# Patient Record
Sex: Female | Born: 1955 | Race: Black or African American | Hispanic: No | Marital: Single | Smoking: Former smoker
Health system: Southern US, Community
[De-identification: ages and names within clinical notes are randomized; demographics above are authoritative.]

## PROBLEM LIST (undated history)

## (undated) DIAGNOSIS — C7931 Secondary malignant neoplasm of brain: Principal | ICD-10-CM

## (undated) DIAGNOSIS — C3490 Malignant neoplasm of unspecified part of unspecified bronchus or lung: Secondary | ICD-10-CM

## (undated) HISTORY — DX: Secondary malignant neoplasm of brain: C79.31

---

## 2013-07-21 ENCOUNTER — Encounter (HOSPITAL_COMMUNITY): Payer: Self-pay | Admitting: Emergency Medicine

## 2013-07-21 ENCOUNTER — Emergency Department (INDEPENDENT_AMBULATORY_CARE_PROVIDER_SITE_OTHER)
Admission: EM | Admit: 2013-07-21 | Discharge: 2013-07-21 | Disposition: A | Discharge status: Home / Self Care | Payer: Self-pay | Source: Home / Self Care | Attending: Family Medicine | Admitting: Family Medicine

## 2013-07-21 DIAGNOSIS — L509 Urticaria, unspecified: Secondary | ICD-10-CM

## 2013-07-21 MED ORDER — FAMOTIDINE 40 MG PO TABS: 40.0000 mg | ORAL_TABLET | Freq: Every day | ORAL | Status: DC

## 2013-07-21 MED ORDER — PREDNISONE 5 MG PO TABS: 5.0000 mg | ORAL_TABLET | Freq: Every day | ORAL | Status: DC

## 2013-07-21 MED ORDER — CETIRIZINE HCL 10 MG PO TABS: 10.0000 mg | ORAL_TABLET | Freq: Every day | ORAL | Status: DC

## 2013-07-21 NOTE — ED Notes (Signed)
C/o rash on arms,legs, back, and neck.  Noticed irritation of skin yesterday.  States when getting out of shower this a.m rash spread all over.  Area are red and irritated.  No otc meds taken for symptoms.  Recent change in laundry detergent.

## 2013-07-22 NOTE — ED Provider Notes (Signed)
CSN: [id number]     Arrival date & time 07/21/13  1346 History   First MD Initiated Contact with Patient 07/21/13 1421     Chief Complaint  Patient presents with  . Rash   (Consider location/radiation/quality/duration/timing/severity/associated sxs/prior Treatment) HPI Comments: Denies fever, recent illness, recent travel or known ill contacts. No new medications or pets.   Patient is a 57 y.o. female presenting with rash. The history is provided by the patient.  Rash Pain location:  Generalized Pain severity:  No pain Onset quality:  Gradual Duration:  1 day Timing:  Constant Progression:  Worsening Chronicity:  New Context comment:  Reports recent change in laundry detergents. Relieved by:  Nothing Worsened by:  Nothing tried Ineffective treatments: Has taken 2 doses of oral benadryl over past 24 hours. Associated symptoms comment:  Describes rash as being very pruritic. Denies associated oral lesions or swelling of lips or tongue. No difficulty breathing, speaking or swallowing.    History reviewed. No pertinent past medical history. History reviewed. No pertinent past surgical history. History reviewed. No pertinent family history. History  Substance Use Topics  . Smoking status: Current Every Day Smoker -- 0.50 packs/day    Types: Cigarettes  . Smokeless tobacco: Not on file  . Alcohol Use: Yes   OB History   Grav Para Term Preterm Abortions TAB SAB Ect Mult Living                 Review of Systems  Skin: Positive for rash.  All other systems reviewed and are negative.    Allergies  Review of patient's allergies indicates no known allergies.  Home Medications   Current Outpatient Rx  Name  Route  Sig  Dispense  Refill  . cetirizine (ZYRTEC) 10 MG tablet   Oral   Take 1 tablet (10 mg total) by mouth daily.   30 tablet   1   . famotidine (PEPCID) 40 MG tablet   Oral   Take 1 tablet (40 mg total) by mouth daily.   30 tablet   1   . predniSONE  (DELTASONE) 5 MG tablet   Oral   Take 1 tablet (5 mg total) by mouth daily with breakfast. 6 tabs po QD beginning 07/21/2013, 5 tabs po QD day 2, 4 tabs po QD day 3, 3 tabs po QD day 4, 2 tab po QD day 5, 1 tab po QD day 6, then stop.   21 tablet   0    BP 111/64  Pulse 63  Temp(Src) 97.7 F (36.5 C) (Oral)  Resp 16  SpO2 100% Physical Exam  Nursing note and vitals reviewed. Constitutional: She is oriented to person, place, and time. She appears well-developed and well-nourished. No distress.  HENT:  Head: Normocephalic and atraumatic.  Mouth/Throat: Uvula is midline, oropharynx is clear and moist and mucous membranes are normal. No oral lesions.  Eyes: Conjunctivae are normal. Pupils are equal, round, and reactive to light. Right eye exhibits no discharge. Left eye exhibits no discharge. No scleral icterus.  Neck: Normal range of motion. Neck supple.  Cardiovascular: Normal rate, regular rhythm and normal heart sounds.   Pulmonary/Chest: Effort normal and breath sounds normal. No stridor. No respiratory distress. She has no wheezes.  Abdominal: Soft. Bowel sounds are normal. She exhibits no distension. There is no tenderness.  Musculoskeletal: Normal range of motion.  Lymphadenopathy:    She has no cervical adenopathy.  Neurological: She is alert and oriented to person, place, and time.  Skin: Skin is warm and dry. Rash noted.  +diffuse urticaria/hives  Psychiatric: She has a normal mood and affect. Her behavior is normal.    ED Course  Procedures (including critical care time) Labs Review Labs Reviewed - No data to display Imaging Review No results found.  EKG Interpretation    Date/Time:    Ventricular Rate:    PR Interval:    QRS Duration:   QT Interval:    QTC Calculation:   R Axis:     Text Interpretation:              MDM   1. Urticaria     Advised patient she was experiencing hives and that it may be difficult to determine the reason for the  allergic response. She was advised to take medications as prescribed today. Cautioned her that should she develop ANY difficulty breathing, speaking or swallowing or swelling of lips or tongue she should either dial 911 for assistance or report to her nearest ER for assistance. If condition continues to reoccur, advised that she may need allergy skin testing.    Jess Barters [place], [place] 07/22/13 [phone]

## 2013-07-23 NOTE — ED Provider Notes (Signed)
Medical screening examination/treatment/procedure(s) were performed by a resident physician or non-physician practitioner and as the supervising physician I was immediately available for consultation/collaboration.  Jaloni Davoli, MD   Liliauna Santoni S Raini Tiley, MD 07/23/13 1022 

## 2014-06-01 ENCOUNTER — Emergency Department (HOSPITAL_COMMUNITY)
Admission: EM | Admit: 2014-06-01 | Discharge: 2014-06-01 | Disposition: A | Discharge status: Home / Self Care | Payer: Self-pay | Attending: Emergency Medicine | Admitting: Emergency Medicine

## 2014-06-01 ENCOUNTER — Encounter (HOSPITAL_COMMUNITY): Payer: Self-pay | Admitting: Emergency Medicine

## 2014-06-01 ENCOUNTER — Emergency Department (HOSPITAL_COMMUNITY): Payer: Self-pay

## 2014-06-01 DIAGNOSIS — R0609 Other forms of dyspnea: Secondary | ICD-10-CM

## 2014-06-01 DIAGNOSIS — R634 Abnormal weight loss: Secondary | ICD-10-CM | POA: Insufficient documentation

## 2014-06-01 DIAGNOSIS — R0600 Dyspnea, unspecified: Secondary | ICD-10-CM | POA: Insufficient documentation

## 2014-06-01 DIAGNOSIS — R079 Chest pain, unspecified: Secondary | ICD-10-CM | POA: Insufficient documentation

## 2014-06-01 DIAGNOSIS — C3491 Malignant neoplasm of unspecified part of right bronchus or lung: Secondary | ICD-10-CM | POA: Insufficient documentation

## 2014-06-01 DIAGNOSIS — Z720 Tobacco use: Secondary | ICD-10-CM | POA: Insufficient documentation

## 2014-06-01 DIAGNOSIS — R61 Generalized hyperhidrosis: Secondary | ICD-10-CM | POA: Insufficient documentation

## 2014-06-01 LAB — BASIC METABOLIC PANEL
Anion gap: 13 (ref 5–15)
BUN: 5 mg/dL — AB (ref 6–23)
CO2: 25 meq/L (ref 19–32)
Calcium: 9.8 mg/dL (ref 8.4–10.5)
Chloride: 106 mEq/L (ref 96–112)
Creatinine, Ser: 0.55 mg/dL (ref 0.50–1.10)
GFR calc Af Amer: >90 mL/min (ref >90)
GFR calc non Af Amer: >90 mL/min (ref >90)
Glucose, Bld: 128 mg/dL — ABNORMAL HIGH (ref 70–99)
POTASSIUM: 3.9 meq/L (ref 3.7–5.3)
Sodium: 144 mEq/L (ref 137–147)

## 2014-06-01 LAB — CBC
HCT: 38.9 % (ref 36.0–46.0)
Hemoglobin: 13.2 g/dL (ref 12.0–15.0)
MCH: 30.7 pg (ref 26.0–34.0)
MCHC: 33.9 g/dL (ref 30.0–36.0)
MCV: 90.5 fL (ref 78.0–100.0)
Platelets: 249 10*3/uL (ref 150–400)
RBC: 4.3 MIL/uL (ref 3.87–5.11)
RDW: 13.9 % (ref 11.5–15.5)
WBC: 4.6 10*3/uL (ref 4.0–10.5)

## 2014-06-01 LAB — I-STAT TROPONIN, ED: Troponin i, poc: 0.01 ng/mL (ref 0.00–0.08)

## 2014-06-01 LAB — PRO B NATRIURETIC PEPTIDE: Pro B Natriuretic peptide (BNP): 68.9 pg/mL (ref 0–125)

## 2014-06-01 MED ORDER — IOHEXOL 300 MG/ML  SOLN
80.0000 mL | Freq: Once | INTRAMUSCULAR | Status: AC | PRN
  Administered 2014-06-01: 80 mL via INTRAVENOUS

## 2014-06-01 NOTE — ED Notes (Addendum)
Social work has talked to pt; taxi voucher given. [place] sandwich given.

## 2014-06-01 NOTE — ED Provider Notes (Signed)
CSN: [id number]     Arrival date & time 06/01/14  1450 History   First MD Initiated Contact with Patient 06/01/14 1900     Chief Complaint  Patient presents with  . Shortness of Breath  . Chest Pain     (Consider location/radiation/quality/duration/timing/severity/associated sxs/prior Treatment) Patient is a 58 y.o. female presenting with shortness of breath and chest pain. The history is provided by the patient and medical records. No language interpreter was used.  Shortness of Breath Associated symptoms: chest pain   Associated symptoms: no abdominal pain, no cough, no diaphoresis, no fever, no headaches, no rash, no vomiting and no wheezing   Chest Pain Associated symptoms: shortness of breath   Associated symptoms: no abdominal pain, no back pain, no cough, no diaphoresis, no fatigue, no fever, no headache, no nausea and not vomiting     Mariah Park is a 58 y.o. female  with no known medical history presents to the Emergency Department complaining of gradual, persistent, progressively worsening shortness of breath and chest pressure onset approximately 3 months ago. Patient reports significant shortness of breath with exertion gradually worsening over the last several months. She has associated 15, weight loss and night sweats.  She reports she smokes 5 cigarettes per day for more than 20 years. She reports that her sister died from an unknown type of cancer.  Patient reports she has not seen a doctor in many many years.  Nothing seems to make her symptoms better. She denies fevers, chills, headache, neck pain, abdominal pain, nausea, vomiting, diarrhea, weakness, dizziness, syncope, dysuria, hematuria.  She denies travel outside the [place].   History reviewed. No pertinent past medical history. History reviewed. No pertinent past surgical history. No family history on file. History  Substance Use Topics  . Smoking status: Current Every Day Smoker -- 0.50 packs/day   Types: Cigarettes  . Smokeless tobacco: Not on file  . Alcohol Use: Yes   OB History   Grav Para Term Preterm Abortions TAB SAB Ect Mult Living                 Review of Systems  Constitutional: Positive for unexpected weight change. Negative for fever, diaphoresis, appetite change and fatigue.       Night sweats  HENT: Negative for mouth sores.   Eyes: Negative for visual disturbance.  Respiratory: Positive for chest tightness and shortness of breath. Negative for cough and wheezing.   Cardiovascular: Positive for chest pain.  Gastrointestinal: Negative for nausea, vomiting, abdominal pain, diarrhea and constipation.  Endocrine: Negative for polydipsia, polyphagia and polyuria.  Genitourinary: Negative for dysuria, urgency, frequency and hematuria.  Musculoskeletal: Negative for back pain and neck stiffness.  Skin: Negative for rash.  Allergic/Immunologic: Negative for immunocompromised state.  Neurological: Negative for syncope, light-headedness and headaches.  Hematological: Does not bruise/bleed easily.  Psychiatric/Behavioral: Negative for sleep disturbance. The patient is not nervous/anxious.       Allergies  Review of patient's allergies indicates no known allergies.  Home Medications   Prior to Admission medications   Not on File   BP 134/89  Pulse 74  Temp(Src) 98.5 F (36.9 C) (Oral)  Resp 18  Ht 5\' 4"  (1.626 m)  Wt 130 lb (58.968 kg)  BMI 22.30 kg/m2  SpO2 96% Physical Exam  Nursing note and vitals reviewed. Constitutional: She appears well-developed and well-nourished. No distress.  Awake, alert, nontoxic appearance  HENT:  Head: Normocephalic and atraumatic.  Mouth/Throat: Oropharynx is clear and moist.  No oropharyngeal exudate.  Eyes: Conjunctivae are normal. No scleral icterus.  Neck: Normal range of motion. Neck supple.  Cardiovascular: Normal rate, regular rhythm, normal heart sounds and intact distal pulses.   Pulmonary/Chest: Effort normal.  No accessory muscle usage. Not tachypneic. No respiratory distress. She has decreased breath sounds in the right upper field, the right middle field and the right lower field. She has no wheezes.  Equal chest expansion Mildly diminished breath sounds on right  Abdominal: Soft. Bowel sounds are normal. She exhibits no distension and no mass. There is no tenderness. There is no rebound and no guarding.  Musculoskeletal: Normal range of motion. She exhibits no edema.  Neurological: She is alert. She exhibits normal muscle tone. Coordination normal.  Speech is clear and goal oriented Moves extremities without ataxia  Skin: Skin is warm and dry. She is not diaphoretic. No erythema.  Psychiatric: She has a normal mood and affect.    ED Course  Procedures (including critical care time) Labs Review Labs Reviewed  BASIC METABOLIC PANEL - Abnormal; Notable for the following:    Glucose, Bld 128 (*)    BUN 5 (*)    All other components within normal limits  CBC  PRO B NATRIURETIC PEPTIDE  I-STAT TROPOININ, ED    Imaging Review Dg Chest 2 View  06/01/2014   CLINICAL DATA:  Shortness of breath and chest pain  EXAM: CHEST  2 VIEW  COMPARISON:  None.  FINDINGS: Cardiac shadow is within normal limits. There is fullness in the right peritracheal region and right hilar region identified. Right basilar atelectasis is noted. The left lung is well aerated. No acute bony abnormality is seen.  IMPRESSION: Changes consistent with increased lymphadenopathy/possible mass in the right peritracheal region and right hilar region. Further evaluation by means of CT of the chest with contrast is recommended.  These results were called by telephone at the time of interpretation on 06/01/2014 at 3:50 pm to Dr. Rogene Houston, who verbally acknowledged these results.   Electronically Signed   By: Inez Catalina M.D.   On: 06/01/2014 15:53   Ct Chest W Contrast  06/01/2014   CLINICAL DATA:  Cough and shortness of breath. Lump  on the left near the last rib.  EXAM: CT CHEST WITH CONTRAST  TECHNIQUE: Multidetector CT imaging of the chest was performed during intravenous contrast administration.  CONTRAST:  80 mL OMNIPAQUE IOHEXOL 300 MG/ML  SOLN  COMPARISON:  PA and lateral chest earlier this same day.  FINDINGS: A necrotic lymph node in the right pretracheal station on image 16 measures 3.1 x 3.1 cm. There is a large right hilar mass encasing the right mainstem bronchus and right main pulmonary artery. The mass measures 7.2 x 7.5 cm on image 28. The lesion narrows the right mainstem bronchus and appears to occlude it distally. The lesion also narrows the right main pulmonary artery. No embolus is seen.  There is no pleural or pericardial effusion.  Heart size is normal.  The lungs demonstrate emphysematous disease. There is volume loss in the right chest secondary to atelectasis related to right mainstem bronchus occlusion. Atelectatic change is seen in the right base. There is a right lower lobe pulmonary nodule on image 39 measures 0.7 cm in diameter. A subpleural nodule in the periphery of the right middle lobe measures 0.6 cm in diameter. A nodule along the left major fissure on image 32 measures 0.4 cm. A nodule in the lingula also on image 32 measures 0.4  cm.  Visualized abdomen shows a possible punctate nonobstructing stone in the upper pole the right kidney. The adrenal glands, spleen, pancreas, gallbladder and biliary tree appear normal. No liver lesion is identified. No focal bony abnormality is seen.  IMPRESSION: Large mass centered in the right hilum consistent with bronchogenic carcinoma. As above, the lesion occludes the distal right mainstem bronchus resulting in volume loss in the right chest and markedly narrows the right main pulmonary artery. Associated metastatic pretracheal lymphadenopathy and nodules in both lungs are as described above.   Electronically Signed   By: Inge Rise M.D.   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  Labs reassuring and ECG nonischemic. Patient chest x-ray with concerning lymphadenopathy versus right peritracheal and hilar mass.  Will obtain chest CT.  9:35pm CT chest with A necrotic lymph node in the right pretracheal station, large right hilar mass encasing the right mainstem bronchus and right main pulmonary artery narrowing the right mainstem bronchus and appears to occlude it distally. The lesion also narrows the right main pulmonary artery. No PE.  CT chest consistent with bronchogenic carcinoma.  Patient alert and oriented.  She is without dyspnea at rest and has no hypoxia. No tachycardia and no PE on CT scan.  Patient is without primary care physician or health insurance. She's been seen by the social work and case management who are actively working to get her an appointment with the wellness Center and referred to oncology. Patient is tanned the importance of close followup and the need for such treatment.  I have personally reviewed patient's vitals, nursing note and any pertinent labs or imaging.  I performed an undressed physical exam.    It has been determined that no acute conditions requiring further emergency intervention are present at this time. The patient/guardian have been advised of the diagnosis and plan. I reviewed all labs and imaging including any potential incidental findings. We have discussed signs and symptoms that warrant return to the ED, such as increasing shortness  of breath or other concerning symptoms.  Patient/guardian has voiced understanding and agreed to follow-up with the PCP or specialist in 7 days.  Vital signs are stable at discharge.   BP 134/89  Pulse 74  Temp(Src) 98.5 F (36.9 C) (Oral)  Resp 18  Ht 5\' 4"  (1.626 m)  Wt 130 lb (58.968 kg)  BMI 22.30 kg/m2  SpO2 96%          Abigail Butts, PA-C 06/02/14 0802

## 2014-06-01 NOTE — ED Notes (Signed)
Pt reports central chest "pressure, tightness" x months with SOB with exertion. Pt also reports weakness and mild nausea, denies diaphoresis. Pt in NAD. Ambulatory to triage. Reports NP cough as well for last several months. States "I take cough drops like candy."

## 2014-06-01 NOTE — Discharge Instructions (Signed)
1. Medications: usual home medications 2. Treatment: rest, drink plenty of fluids,  3. Follow Up: [facility]   Lung Cancer Lung cancer is an abnormal growth of cells in one or both of your lungs. These extra cells may form a mass of tissue called a growth or tumor. Tumors can be either cancerous (malignant) or not cancerous (benign).  Lung cancer is the most common cause of cancer death in men and women. There are several different types of lung cancers. Usually, lung cancer is described as either small cell lung cancer or nonsmall cell lung cancer. Other types of cancer occur in the lungs, including carcinoid and cancers spread from other organs. The types of cancer have different behavior and treatment. RISK FACTORS Smoking is the most common risk factor for developing lung cancer. Other risk factors include:  Radon gas exposure.  Asbestos and other industrial substance exposure.  Second hand tobacco smoke.  Air pollution.  Family or personal history of lung cancer.  Age older than 59 years. CAUSES  Lung cancer usually starts when the lungs are exposed to harmful chemicals. Smoking is the most common risk factor for lung cancer. When you quit smoking, your risk of lung cancer falls each year (but is never the same as a person who has never smoked).  SYMPTOMS  Lung cancer may not have any symptoms in its early stages. The symptoms can depend on the type of cancer, its location, and other factors. Symptoms can include:  Cough (either new, different, or more severe).  Shortness of breath.  Coughing up blood (hemoptysis).  Chest pain.  Hoarseness.  Swelling of the face.  Drooping eyelid.  Changes in blood tests, such as low sodium (hyponatremia), high calcium (hypercalcemia), or low blood count (anemia).  Weight loss. DIAGNOSIS  Your health care provider may suspect lung cancer based on your symptoms or based  on tests obtained for other reasons. Tests or procedures used to find or confirm the presence of lung cancer may include:  Chest X-ray.  CT scan of the lungs and chest.  Blood tests.  Taking a tissue sample (biopsy) from your lung to look for cancer cells. Your cancer will be staged to determine its severity and extent. Staging is a careful attempt to find out the size of the tumor, whether the cancer has spread, and if so, to what parts of the body. You may need to have more tests to determine the stage of your cancer. The test results will help determine what treatment plan is best for you.   Stage 0--This is the earliest stage of lung cancer. In this stage the tumor is present in only a few layers of cells and has not grown beyond the inner lining of the lungs. Stage 0 (carcinoma in situ) is considered noninvasive, meaning at this stage it is not yet capable of spreading to other regions.  Stage I-- The cancer is located only in the lungs and not spread to any lymph nodes.  Stage II--The cancer is in the lungs and the nearby lymph nodes.  Stage III--The cancer is in the lungs and the lymph nodes in the middle of the chest. This is also called locally advanced disease. This stage has two subtypes:  Stage IIIa - The cancer has spread only to lymph nodes on the same side of the chest where the cancer started.  Stage IIIb - The cancer has spread to lymph nodes on the  opposite side of the chest or above the collar bone.  Stage IV-- This is the most advanced stage of lung cancer and is also called advanced disease. This stage describes when the cancer has spread to both lungs, the fluid in the area around the lungs, or to another body part. Your health care provider may tell you the detailed stage of your cancer, which includes both a number and a letter.  TREATMENT  Depending on the type and stage, lung cancer may be treated with surgery, radiation therapy, chemotherapy, or targeted therapy.  Some people have a combination of these therapies. Your treatment plan will be developed by your health care team.  [place] not smoke.  Only take over-the-counter or prescription medicines for pain, discomfort, or fever as directed by your health care provider.  Maintain a healthy diet.  Consider joining a support group. This may help you learn to cope with the stress of having lung cancer.  Seek advice to help you manage treatment side effects.  Keep all follow-up appointments as directed by your health care provider.  Inform your cancer specialist if you are admitted to the hospital. [place] IF:   You are losing weight without trying.  You have a persistent cough.  You feel short of breath.  You tire easily. SEEK IMMEDIATE MEDICAL CARE IF:   You cough up clotted blood or bright red blood.  Your pain is not manageable or controlled by medicine.  You develop new difficulty breathing or chest pain.  You develop swelling in one or both ankles or legs, or swelling in your face or neck.  You develop headache or confusion. Document Released: 11/05/2000 Document Revised: 05/20/2013 Document Reviewed: 12/03/2013 [facility] Patient Information 2015 [place], [place]. This information is not intended to replace advice given to you by your health care provider. Make sure you discuss any questions you have with your health care provider.

## 2014-06-02 MED ORDER — OXYCODONE-ACETAMINOPHEN 5-325 MG PO TABS: 1.0000 | ORAL_TABLET | ORAL | Status: DC | PRN

## 2014-06-02 NOTE — ED Provider Notes (Signed)
5:09 PM Patient called in to Case manager asking for pain medication for her chest. She is currently awaiting appointment with  [facility] and will require referral to oncology. Will prescribe percocet.    Margarita Mail, PA-C 06/02/14 1714

## 2014-06-02 NOTE — ED Provider Notes (Signed)
Medical screening examination/treatment/procedure(s) were performed by non-physician practitioner and as supervising physician I was immediately available for consultation/collaboration.   EKG Interpretation None        Mariea Clonts, MD 06/02/14 2352

## 2014-06-02 NOTE — Care Management (Addendum)
ED CM and ED CSW consulted by. H Muthersbaugh PA-C concerning possible cancer finding noted on Chest CT. Patient presented today with complaints of Chest tightness and non productive cough that she has had for several months. Met with patient offered contact someone to come to be with her but she declined, Discussed [place] and follow up care patient agreeable to plan. Explained that this CM will contact the clinic in the morning and I will contact her once I have spoken with the scheduler. Patient agreeable, verified contact information. Patient is requesting a ride home, ED CSW willl arrange for transportion. ED CM will follow up with patient.

## 2014-06-03 NOTE — Care Management (Addendum)
ED CM  placed [place] Referral Rhys Martini [phone], also contacted the Dr.Jegede he has agreed to  Follow patient at [facility].  Patient was updated, and is agreeable, she.verbalized understanding teach back done. Explained that she will receive a call to schedule an appointment, provided patient with contact number if she should have any further questions of concerns. Patient verbalized understanding and appreciation for the assistance. CM will follow up.

## 2014-06-04 ENCOUNTER — Telehealth: Payer: Self-pay | Admitting: Internal Medicine

## 2014-06-04 NOTE — Telephone Encounter (Signed)
LEFT MESSAGE FOR PATIENT TO RETURN CALL TO [facility] NP APPT.

## 2014-06-04 NOTE — ED Provider Notes (Signed)
Medical screening examination/treatment/procedure(s) were performed by non-physician practitioner and as supervising physician I was immediately available for consultation/collaboration.   EKG Interpretation   Date/Time:  Tuesday June 01 2014 15:09:55 EDT Ventricular Rate:  83 PR Interval:  162 QRS Duration: 110 QT Interval:  370 QTC Calculation: 434 R Axis:   73 Text Interpretation:  Sinus rhythm with Fusion complexes Septal infarct ,  age undetermined Abnormal ECG ED PHYSICIAN INTERPRETATION AVAILABLE IN  [facility] [place] Confirmed by TEST, Record ([id number]) on 06/03/2014 7:34:46 AM       Leota Jacobsen, MD 06/04/14 [phone]

## 2014-06-08 ENCOUNTER — Telehealth: Payer: Self-pay | Admitting: Internal Medicine

## 2014-06-08 NOTE — Telephone Encounter (Signed)
S/W PATIENT AND GAVE NP APPT FOR 10/28 @ 10:30 W/DR. MOHAMED.  REFERRING- ED  DX- CHEST MASS  C/D ON 10/27 FOR 10/28 NP APPT.

## 2014-06-09 ENCOUNTER — Other Ambulatory Visit: Payer: Self-pay | Admitting: Internal Medicine

## 2014-06-09 ENCOUNTER — Ambulatory Visit (HOSPITAL_BASED_OUTPATIENT_CLINIC_OR_DEPARTMENT_OTHER): Payer: Self-pay | Admitting: Internal Medicine

## 2014-06-09 ENCOUNTER — Encounter: Payer: Self-pay | Admitting: Internal Medicine

## 2014-06-09 ENCOUNTER — Ambulatory Visit: Payer: Self-pay

## 2014-06-09 ENCOUNTER — Other Ambulatory Visit: Payer: Self-pay | Admitting: Medical Oncology

## 2014-06-09 ENCOUNTER — Other Ambulatory Visit (HOSPITAL_BASED_OUTPATIENT_CLINIC_OR_DEPARTMENT_OTHER): Payer: Self-pay

## 2014-06-09 ENCOUNTER — Telehealth: Payer: Self-pay | Admitting: Internal Medicine

## 2014-06-09 VITALS — BP 132/92 | HR 79 | Temp 99.3°F | Resp 19 | Ht 64.0 in | Wt 138.8 lb

## 2014-06-09 DIAGNOSIS — R918 Other nonspecific abnormal finding of lung field: Secondary | ICD-10-CM

## 2014-06-09 DIAGNOSIS — R591 Generalized enlarged lymph nodes: Secondary | ICD-10-CM

## 2014-06-09 DIAGNOSIS — R229 Localized swelling, mass and lump, unspecified: Secondary | ICD-10-CM

## 2014-06-09 DIAGNOSIS — R0602 Shortness of breath: Secondary | ICD-10-CM

## 2014-06-09 DIAGNOSIS — R634 Abnormal weight loss: Secondary | ICD-10-CM

## 2014-06-09 LAB — CBC WITH DIFFERENTIAL/PLATELET
BASO%: 0.1 % (ref 0.0–2.0)
BASOS ABS: 0 10*3/uL (ref 0.0–0.1)
EOS%: 0.7 % (ref 0.0–7.0)
Eosinophils Absolute: 0.1 10*3/uL (ref 0.0–0.5)
HCT: 34.8 % (ref 34.8–46.6)
HEMOGLOBIN: 11.8 g/dL (ref 11.6–15.9)
LYMPH#: 1.3 10*3/uL (ref 0.9–3.3)
LYMPH%: 14.5 % (ref 14.0–49.7)
MCH: 30.6 pg (ref 25.1–34.0)
MCHC: 33.9 g/dL (ref 31.5–36.0)
MCV: 90.4 fL (ref 79.5–101.0)
MONO#: 0.9 10*3/uL (ref 0.1–0.9)
MONO%: 10.6 % (ref 0.0–14.0)
NEUT#: 6.5 10*3/uL (ref 1.5–6.5)
NEUT%: 74.1 % (ref 38.4–76.8)
Platelets: 257 10*3/uL (ref 145–400)
RBC: 3.85 10*6/uL (ref 3.70–5.45)
RDW: 14.2 % (ref 11.2–14.5)
WBC: 8.8 10*3/uL (ref 3.9–10.3)

## 2014-06-09 LAB — COMPREHENSIVE METABOLIC PANEL (CC13)
ALBUMIN: 2.8 g/dL — AB (ref 3.5–5.0)
ALT: 106 U/L — ABNORMAL HIGH (ref 0–55)
AST: 49 U/L — AB (ref 5–34)
Alkaline Phosphatase: 104 U/L (ref 40–150)
Anion Gap: 8 mEq/L (ref 3–11)
BUN: 6.7 mg/dL — AB (ref 7.0–26.0)
CALCIUM: 10 mg/dL (ref 8.4–10.4)
CHLORIDE: 106 meq/L (ref 98–109)
CO2: 27 mEq/L (ref 22–29)
Creatinine: 0.7 mg/dL (ref 0.6–1.1)
Glucose: 110 mg/dl (ref 70–140)
POTASSIUM: 3.8 meq/L (ref 3.5–5.1)
Sodium: 140 mEq/L (ref 136–145)
TOTAL PROTEIN: 7.3 g/dL (ref 6.4–8.3)
Total Bilirubin: 0.47 mg/dL (ref 0.20–1.20)

## 2014-06-09 NOTE — Patient Instructions (Signed)
You Can Quit Smoking If you are ready to quit smoking or are thinking about it, congratulations! You have chosen to help yourself be healthier and live longer! There are lots of different ways to quit smoking. Nicotine gum, nicotine patches, a nicotine inhaler, or nicotine nasal spray can help with physical craving. Hypnosis, support groups, and medicines help break the habit of smoking. TIPS TO GET OFF AND STAY OFF CIGARETTES  Learn to predict your moods. Do not let a bad situation be your excuse to have a cigarette. Some situations in your life might tempt you to have a cigarette.  Ask friends and co-workers not to smoke around you.  Make your home smoke-free.  Never have "just one" cigarette. It leads to wanting another and another. Remind yourself of your decision to quit.  On a card, make a list of your reasons for not smoking. Read it at least the same number of times a day as you have a cigarette. Tell yourself everyday, "I do not want to smoke. I choose not to smoke."  Ask someone at home or work to help you with your plan to quit smoking.  Have something planned after you eat or have a cup of coffee. Take a walk or get other exercise to perk you up. This will help to keep you from overeating.  Try a relaxation exercise to calm you down and decrease your stress. Remember, you may be tense and nervous the first two weeks after you quit. This will pass.  Find new activities to keep your hands busy. Play with a pen, coin, or rubber band. Doodle or draw things on paper.  Brush your teeth right after eating. This will help cut down the craving for the taste of tobacco after meals. You can try mouthwash too.  Try gum, breath mints, or diet candy to keep something in your mouth. IF YOU SMOKE AND WANT TO QUIT:  Do not stock up on cigarettes. Never buy a carton. Wait until one pack is finished before you buy another.  Never carry cigarettes with you at work or at home.  Keep cigarettes  as far away from you as possible. Leave them with someone else.  Never carry matches or a lighter with you.  Ask yourself, "Do I need this cigarette or is this just a reflex?"  Bet with someone that you can quit. Put cigarette money in a piggy bank every morning. If you smoke, you give up the money. If you do not smoke, by the end of the week, you keep the money.  Keep trying. It takes 21 days to change a habit!  Talk to your doctor about using medicines to help you quit. These include nicotine replacement gum, lozenges, or skin patches. Document Released: 05/26/2009 Document Revised: 10/22/2011 Document Reviewed: 05/26/2009 ExitCare Patient Information 2015 [employer]. This information is not intended to replace advice given to you by your health care provider. Make sure you discuss any questions you have with your health care provider.  

## 2014-06-09 NOTE — Progress Notes (Signed)
[place] [phone]   [phone]  CONSULT NOTE  REFERRING PHYSICIAN: Dr. Lacretia Leigh.  REASON FOR CONSULTATION:  58 years old [place] American female with questionable lung cancer.  HPI Mariah Park is a 58 y.o. female with no significant past medical history but long history of smoking. The patient has been complaining of progressive shortness of breath as well as chest pain and cough productive of yellowish sputum started a few months ago. She also felt a nodule in the left side of the abdomen will be week ago. The patient also has a weight loss of around 20 pounds over the last few weeks. She presented to the emergency Department at [facility] for evaluation. Chest x-ray followed by CT scan of the chest was performed on 06/01/2014 and it showed necrotic lymph node in the right pretracheal station measures 3.1 x 3.1 cm. There is a large right hilar mass encasing the right mainstem bronchus and right main pulmonary artery. The mass measures 7.2 x 7.5 cm. The lesion narrows the right mainstem bronchus and appears to occlude it distally. The lesion also narrows the right main pulmonary artery. There is a right lower lobe pulmonary nodule measures 0.7 cm in diameter. A subpleural nodule in the periphery of the right middle lobe measures 0.6 cm in diameter. A nodule along the left major fissure measures 0.4 cm. A nodule in the lingula also measures 0.4 cm.  The patient was referred to me today for evaluation and recommendation regarding treatment of her condition. She denied having any significant weight loss or night sweats, no nausea or vomiting. She continues to have occasional central chest pain as well as shortness of breath with cough productive of clear sputum but no hemoptysis.  HPI  PAST MEDICAL HISTORY: The patient denied having any significant medical issues. She has no hypertension, diabetes mellitus, dyslipidemia, heart disease or  stroke  FAMILY HISTORY: mother had brain tumor and father died from old age  SOCIAL HISTORY: The patient is single and has one son. She is currently unemployed and used to work at [employer]. She has a history of smoking one pack per day for around 35 years and quit one week ago. The patient denied having any history of alcohol abuse but she smokes Marjuina.  Social History History  Substance Use Topics  . Smoking status: Former Smoker -- 0.50 packs/day    Types: Cigarettes    Quit date: 06/02/2014  . Smokeless tobacco: Not on file  . Alcohol Use: Yes    No Known Allergies  Current Outpatient Prescriptions  Medication Sig Dispense Refill  . oxyCODONE-acetaminophen (PERCOCET) 5-325 MG per tablet Take 1-2 tablets by mouth every 4 (four) hours as needed.  20 tablet  0   No current facility-administered medications for this visit.    Review of Systems  Constitutional: positive for fatigue and weight loss Eyes: negative Ears, nose, mouth, throat, and face: negative Respiratory: positive for cough, dyspnea on exertion, pleurisy/chest pain and sputum Cardiovascular: negative Gastrointestinal: negative Genitourinary:negative Integument/breast: negative Hematologic/lymphatic: negative Musculoskeletal:negative Neurological: negative Behavioral/Psych: negative Endocrine: negative Allergic/Immunologic: negative  Physical Exam  XBM:WUXLK, healthy, no distress, well nourished, well developed and anxious SKIN: skin color, texture, turgor are normal, no rashes or significant lesions HEAD: Normocephalic, No masses, lesions, tenderness or abnormalities EYES: normal, PERRLA EARS: External ears normal, Canals clear OROPHARYNX:no exudate, no erythema and lips, buccal mucosa, and tongue normal  NECK: supple, no adenopathy, no JVD LYMPH:  no  palpable lymphadenopathy, no hepatosplenomegaly BREAST:not examined LUNGS: expiratory wheezes bilaterally HEART: regular rate & rhythm, no murmurs  and no gallops ABDOMEN:abdomen soft, non-tender, normal bowel sounds and no masses or organomegaly BACK: Back symmetric, no curvature., No CVA tenderness EXTREMITIES:no joint deformities, effusion, or inflammation, no edema, no skin discoloration  NEURO: alert & oriented x 3 with fluent speech, no focal motor/sensory deficits  PERFORMANCE STATUS: ECOG 1  LABORATORY DATA: Lab Results  Component Value Date   WBC 8.8 06/09/2014   HGB 11.8 06/09/2014   HCT 34.8 06/09/2014   MCV 90.4 06/09/2014   PLT 257 06/09/2014      Chemistry      Component Value Date/Time   NA 140 06/09/2014 1133   NA 144 06/01/2014 1614   K 3.8 06/09/2014 1133   K 3.9 06/01/2014 1614   CL 106 06/01/2014 1614   CO2 27 06/09/2014 1133   CO2 25 06/01/2014 1614   BUN 6.7* 06/09/2014 1133   BUN 5* 06/01/2014 1614   CREATININE 0.7 06/09/2014 1133   CREATININE 0.55 06/01/2014 1614      Component Value Date/Time   CALCIUM 10.0 06/09/2014 1133   CALCIUM 9.8 06/01/2014 1614   ALKPHOS 104 06/09/2014 1133   AST 49* 06/09/2014 1133   ALT 106* 06/09/2014 1133   BILITOT 0.47 06/09/2014 1133       RADIOGRAPHIC STUDIES: Dg Chest 2 View  06/01/2014   CLINICAL DATA:  Shortness of breath and chest pain  EXAM: CHEST  2 VIEW  COMPARISON:  None.  FINDINGS: Cardiac shadow is within normal limits. There is fullness in the right peritracheal region and right hilar region identified. Right basilar atelectasis is noted. The left lung is well aerated. No acute bony abnormality is seen.  IMPRESSION: Changes consistent with increased lymphadenopathy/possible mass in the right peritracheal region and right hilar region. Further evaluation by means of CT of the chest with contrast is recommended.  These results were called by telephone at the time of interpretation on 06/01/2014 at 3:50 pm to Dr. Rogene Houston, who verbally acknowledged these results.   Electronically Signed   By: Inez Catalina M.D.   On: 06/01/2014 15:53   Ct Chest W  Contrast  06/01/2014   CLINICAL DATA:  Cough and shortness of breath. Lump on the left near the last rib.  EXAM: CT CHEST WITH CONTRAST  TECHNIQUE: Multidetector CT imaging of the chest was performed during intravenous contrast administration.  CONTRAST:  80 mL OMNIPAQUE IOHEXOL 300 MG/ML  SOLN  COMPARISON:  PA and lateral chest earlier this same day.  FINDINGS: A necrotic lymph node in the right pretracheal station on image 16 measures 3.1 x 3.1 cm. There is a large right hilar mass encasing the right mainstem bronchus and right main pulmonary artery. The mass measures 7.2 x 7.5 cm on image 28. The lesion narrows the right mainstem bronchus and appears to occlude it distally. The lesion also narrows the right main pulmonary artery. No embolus is seen.  There is no pleural or pericardial effusion.  Heart size is normal.  The lungs demonstrate emphysematous disease. There is volume loss in the right chest secondary to atelectasis related to right mainstem bronchus occlusion. Atelectatic change is seen in the right base. There is a right lower lobe pulmonary nodule on image 39 measures 0.7 cm in diameter. A subpleural nodule in the periphery of the right middle lobe measures 0.6 cm in diameter. A nodule along the left major fissure on image 32 measures  0.4 cm. A nodule in the lingula also on image 32 measures 0.4 cm.  Visualized abdomen shows a possible punctate nonobstructing stone in the upper pole the right kidney. The adrenal glands, spleen, pancreas, gallbladder and biliary tree appear normal. No liver lesion is identified. No focal bony abnormality is seen.  IMPRESSION: Large mass centered in the right hilum consistent with bronchogenic carcinoma. As above, the lesion occludes the distal right mainstem bronchus resulting in volume loss in the right chest and markedly narrows the right main pulmonary artery. Associated metastatic pretracheal lymphadenopathy and nodules in both lungs are as described above.    Electronically Signed   By: Mariah Park M.D.   On: 06/01/2014 20:53    ASSESSMENT: This is a very pleasant 58 years old [place] American female presented for evaluation of questionable metastatic lung cancer presented with large mass centered in the right hilum as well as mediastinal lymphadenopathy and bilateral pulmonary nodules and subcutaneous nodule highly suspicious for small cell lung cancer but non-small cell lung cancer cannot be excluded at this point. The patient has no tissue diagnosis.   PLAN: I had a lengthy discussion with the patient today about her condition and I showed her the images of the CT scan of the chest. I recommended for the patient to complete the staging workup by ordering a PET scan as well as MRI of the brain to rule out any other metastatic disease. I will also arrange for the patient appointment with thoracic surgery next week at the multidisciplinary thoracic oncology clinic for evaluation and constipation of bronchoscopy or biopsy of the subcutaneous nodules for tissue diagnosis.  I would see the patient back for followup visit in 2 weeks for evaluation and discussion of her treatment options based on the final staging workup and biopsy results. She was advised to call immediately if she has any concerning symptoms in the interval.  The patient voices understanding of current disease status and treatment options and is in agreement with the current care plan.  All questions were answered. The patient knows to call the clinic with any problems, questions or concerns. We can certainly see the patient much sooner if necessary.  Thank you so much for allowing me to participate in the care of [place]. I will continue to follow up the patient with you and assist in her care.  I spent 40 minutes counseling the patient face to face. The total time spent in the appointment was 60 minutes.  Disclaimer: This note was dictated with voice recognition software.  Similar sounding words can inadvertently be transcribed and may not be corrected upon review.   Nicholette Dolson K. 06/09/2014, 12:29 PM

## 2014-06-09 NOTE — Telephone Encounter (Signed)
Pt confirmed MD visit per 10/28 POF, gave pt AVS.... KJ

## 2014-06-09 NOTE — Progress Notes (Signed)
Checked in new patient.she has applied for insurance-- [employer]?? She has not traveled and has her appt card. She knows she is self pay until she gets insurance.

## 2014-06-10 ENCOUNTER — Telehealth: Payer: Self-pay | Admitting: *Deleted

## 2014-06-10 NOTE — Telephone Encounter (Signed)
Called [facility] per Dr. Everrett Coombe and Dr. Worthy Flank request to set up an appt.  Received appt.  Called patient with appt.  She is unable to be seen today.  She stated there is no way possible she could make it today.  I called [facility] to update.

## 2014-06-14 ENCOUNTER — Telehealth: Payer: Self-pay | Admitting: Surgery

## 2014-06-14 NOTE — Telephone Encounter (Signed)
ED CM received call from patient today regarding information about cancer resources. Referred patient to cancer center navigator at [phone]. No further ED CM needs identified

## 2014-06-15 ENCOUNTER — Telehealth: Payer: Self-pay | Admitting: *Deleted

## 2014-06-15 ENCOUNTER — Encounter: Payer: Self-pay | Admitting: *Deleted

## 2014-06-15 NOTE — Telephone Encounter (Signed)
I discussed patient's transportation issue with Dr. Julien Nordmann.  He was ok with patient coming next week after scans to see him and surgery at clinic.  I called patient back to update.  She was thankful for the consolidation of appt's

## 2014-06-15 NOTE — Progress Notes (Signed)
Clinical Social Work received phone call from patient stating she "needs somewhere to live for a few weeks".  CSW suggested a community shelter- patient shared she cannot stay at a shelter.  Patient is currently residing at someone's home but feels it is not in her best interest to stay there. CSW provided patient with information on [employer] and contacted Norton Blizzard, thoracic navigator to follow up with patient regarding scheduled appointments.  Polo Riley, MSW, LCSW, OSW-C Clinical Social Worker [facility] [phone]

## 2014-06-15 NOTE — Telephone Encounter (Signed)
I spoke with social worker today.  Patient is having transportation and housing issues.  It would be better for patient is she came next week 06/24/14 at 3:00.  She is aware of her PET/MRI Brain scan on 06/18/14.

## 2014-06-18 ENCOUNTER — Ambulatory Visit (HOSPITAL_COMMUNITY)
Admission: RE | Admit: 2014-06-18 | Discharge: 2014-06-18 | Disposition: A | Discharge status: Home / Self Care | Payer: Self-pay | Source: Ambulatory Visit | Attending: Diagnostic Radiology | Admitting: Diagnostic Radiology

## 2014-06-18 ENCOUNTER — Encounter (HOSPITAL_COMMUNITY)
Admission: RE | Admit: 2014-06-18 | Discharge: 2014-06-18 | Disposition: A | Discharge status: Home / Self Care | Payer: Self-pay | Source: Ambulatory Visit | Attending: Internal Medicine | Admitting: Internal Medicine

## 2014-06-18 ENCOUNTER — Encounter (HOSPITAL_COMMUNITY): Payer: Self-pay

## 2014-06-18 DIAGNOSIS — C7931 Secondary malignant neoplasm of brain: Secondary | ICD-10-CM | POA: Insufficient documentation

## 2014-06-18 DIAGNOSIS — R918 Other nonspecific abnormal finding of lung field: Secondary | ICD-10-CM

## 2014-06-18 DIAGNOSIS — K118 Other diseases of salivary glands: Secondary | ICD-10-CM | POA: Insufficient documentation

## 2014-06-18 DIAGNOSIS — C3490 Malignant neoplasm of unspecified part of unspecified bronchus or lung: Secondary | ICD-10-CM | POA: Insufficient documentation

## 2014-06-18 LAB — GLUCOSE, CAPILLARY: Glucose-Capillary: 96 mg/dL (ref 70–99)

## 2014-06-18 MED ORDER — FLUDEOXYGLUCOSE F - 18 (FDG) INJECTION: 6.8000 | Freq: Once | INTRAVENOUS | Status: AC | PRN

## 2014-06-18 MED ORDER — GADOBENATE DIMEGLUMINE 529 MG/ML IV SOLN
12.0000 mL | Freq: Once | INTRAVENOUS | Status: AC | PRN
  Administered 2014-06-18: 12 mL via INTRAVENOUS

## 2014-06-19 ENCOUNTER — Other Ambulatory Visit: Payer: Self-pay | Admitting: Internal Medicine

## 2014-06-19 DIAGNOSIS — R918 Other nonspecific abnormal finding of lung field: Secondary | ICD-10-CM

## 2014-06-21 ENCOUNTER — Encounter: Payer: Self-pay | Admitting: *Deleted

## 2014-06-21 ENCOUNTER — Other Ambulatory Visit: Payer: Self-pay | Admitting: Internal Medicine

## 2014-06-21 ENCOUNTER — Ambulatory Visit: Payer: Self-pay

## 2014-06-21 ENCOUNTER — Encounter: Payer: Self-pay | Admitting: Internal Medicine

## 2014-06-21 ENCOUNTER — Telehealth: Payer: Self-pay | Admitting: *Deleted

## 2014-06-21 DIAGNOSIS — C7931 Secondary malignant neoplasm of brain: Secondary | ICD-10-CM

## 2014-06-21 HISTORY — DX: Secondary malignant neoplasm of brain: C79.31

## 2014-06-21 MED ORDER — DEXAMETHASONE 4 MG PO TABS: 4.0000 mg | ORAL_TABLET | Freq: Two times a day (BID) | ORAL | Status: DC

## 2014-06-21 NOTE — Telephone Encounter (Signed)
Spoke with Dr. Julien Nordmann about patient appt with Rad Onc on 06/30/14.  Dr. Julien Nordmann would like patient to start on medication  due to brain mets.  I called patient and left vm message to call me with name and phone number

## 2014-06-21 NOTE — Telephone Encounter (Signed)
Called patient to update regarding medication.  I asked if she was having any neuro symptoms like headaches or nausea/vomiting or blurred/double vision.  She stated no.  Per Dr. Julien Nordmann, I stated that MRI Brain showed something and she needed to start mediation as soon as possible.  I notified her of prescription that is here waiting for her.  She stated she could not get here today but she would come first thing tomorrow to pick up prescription.  I notified her that if she has any sever headaches, nausea/vomiting or vision changes to go to ED.  She verbalized understanding.

## 2014-06-22 ENCOUNTER — Ambulatory Visit: Payer: Self-pay | Admitting: Internal Medicine

## 2014-06-22 ENCOUNTER — Telehealth: Payer: Self-pay | Admitting: *Deleted

## 2014-06-22 NOTE — Telephone Encounter (Signed)
Called patient today regarding prescription.  I noted patient still has not picked up prescription for decadron.  I spoke with patient and asked how she was feeling, she stated not to good.  I asked if she was able to get transportation to the cancer center to pick up prescription.  She stated yes, she will be here today.  I instructed her on what to do when she gets to cancer center to pick up medication.  She verbalized understanding.

## 2014-06-23 ENCOUNTER — Encounter: Payer: Self-pay | Admitting: *Deleted

## 2014-06-24 ENCOUNTER — Encounter: Payer: Self-pay | Admitting: *Deleted

## 2014-06-24 ENCOUNTER — Ambulatory Visit: Payer: Self-pay | Attending: Internal Medicine | Admitting: Physical Therapy

## 2014-06-24 ENCOUNTER — Ambulatory Visit
Admission: RE | Admit: 2014-06-24 | Discharge: 2014-06-24 | Disposition: A | Discharge status: Home / Self Care | Payer: Self-pay | Source: Ambulatory Visit | Attending: Radiation Oncology | Admitting: Radiation Oncology

## 2014-06-24 ENCOUNTER — Ambulatory Visit (HOSPITAL_BASED_OUTPATIENT_CLINIC_OR_DEPARTMENT_OTHER): Payer: Self-pay | Admitting: Internal Medicine

## 2014-06-24 ENCOUNTER — Encounter: Payer: Self-pay | Admitting: Cardiothoracic Surgery

## 2014-06-24 ENCOUNTER — Encounter: Payer: Self-pay | Admitting: Internal Medicine

## 2014-06-24 ENCOUNTER — Ambulatory Visit (INDEPENDENT_AMBULATORY_CARE_PROVIDER_SITE_OTHER): Payer: Self-pay | Admitting: Cardiothoracic Surgery

## 2014-06-24 VITALS — BP 111/65 | HR 81 | Temp 98.9°F | Resp 17 | Ht 64.0 in | Wt 140.6 lb

## 2014-06-24 DIAGNOSIS — R918 Other nonspecific abnormal finding of lung field: Secondary | ICD-10-CM

## 2014-06-24 DIAGNOSIS — C7931 Secondary malignant neoplasm of brain: Secondary | ICD-10-CM

## 2014-06-24 DIAGNOSIS — Z5189 Encounter for other specified aftercare: Secondary | ICD-10-CM | POA: Insufficient documentation

## 2014-06-24 DIAGNOSIS — R0602 Shortness of breath: Secondary | ICD-10-CM | POA: Insufficient documentation

## 2014-06-24 NOTE — Progress Notes (Signed)
[address]       ,[place] [id number]             [phone]                    Mariah Park [place] Medical Record #[id number] Date of Birth: 09-10-1955  Referring: Curt Bears, MD Primary Care: No PCP Per Patient  Chief Complaint:   No chief complaint on file.   History of Present Illness:    Mariah Park 58 y.o. female is seen in the office  today for  Complaint of progressive shortness of breath as well as chest pain and cough productive of yellowish sputum started a few months ago. She also felt a nodule in the left side of the abdomen several weeks ago. The patient also has a weight loss of around 20 pounds over the last few weeks. She presented to the emergency Department at [facility] for evaluation. Chest x-ray followed by CT scan of the chest was performed on 06/01/2014 and it showed necrotic lymph node in the right pretracheal station measures 3.1 x 3.1 cm. There is a large right hilar mass encasing the right mainstem bronchus and right main pulmonary artery. The mass measures 7.2 x 7.5 cm. The lesion narrows the right mainstem bronchus and appears to occlude it distally. The lesion also narrows the right main pulmonary artery. There is a right lower lobe pulmonary nodule measures 0.7 cm in diameter. A subpleural nodule in the periphery of the right middle lobe measures 0.6 cm in diameter. A nodule along the left major fissure measures 0.4 cm. A nodule in the lingula also measures 0.4 cm.   The patient has not stopped smoking but has been a smoker since age 63.      Current Activity/ Functional Status:  Patient is independent with mobility/ambulation, transfers, ADL's, IADL's.   Zubrod Score: At the time of surgery this patient's most appropriate activity status/level should be described as: []     0    Normal activity, no symptoms [x]     1    Restricted in physical strenuous activity but ambulatory, able to do out light  work []     2    Ambulatory and capable of self care, unable to do work activities, up and about               >50 % of waking hours                              []     3    Only limited self care, in bed greater than 50% of waking hours []     4    Completely disabled, no self care, confined to bed or chair []     5    Moribund   Past Medical History  Diagnosis Date  . Brain metastases 06/21/2014   PAST MEDICAL HISTORY:  She has no hypertension, diabetes mellitus, dyslipidemia, heart disease or stroke  FAMILY HISTORY: mother had brain tumor and father died from old age  SOCIAL HISTORY: The patient is single and has one son. She is currently unemployed and used to work at [employer].   History   Social History  . Marital Status: Single    Spouse Name: N/A    Number of Children: N/A  . Years of Education: N/A   Occupational History  . Not  on file.   Social History Main Topics  . Smoking status: Former Smoker -- 0.50 packs/day    Types: Cigarettes    Quit date: 06/02/2014  . Smokeless tobacco: Not on file  . Alcohol Use: Yes  . Drug Use: 14.00 per week    Special: Marijuana  . Sexual Activity: Not Currently   Other Topics Concern  . Not on file   Social History Narrative    History  Smoking status  . Former Smoker -- 0.50 packs/day  . Types: Cigarettes  . Quit date: 06/02/2014  Smokeless tobacco  . Not on file    History  Alcohol Use  . Yes     No Known Allergies  Current Outpatient Prescriptions  Medication Sig Dispense Refill  . dexamethasone (DECADRON) 4 MG tablet Take 1 tablet (4 mg total) by mouth 2 (two) times daily. 30 tablet 0  . oxyCODONE-acetaminophen (PERCOCET) 5-325 MG per tablet Take 1-2 tablets by mouth every 4 (four) hours as needed. 20 tablet 0   No current facility-administered medications for this visit.     Review of Systems:     Cardiac Review of Systems: Y or N  Chest Pain [  y  ]  Resting SOB [ y  ] Exertional SOB  Blue.Reese  ]  Orthopnea  [  ]   Pedal Edema [   ]    Palpitations [  ] Syncope  [  ]   Presyncope [   ]  General Review of Systems: [Y] = yes [  ]=no Constitional: recent weight change [  ];  Wt loss over the last 3 months [   ] anorexia [  ]; fatigue [  ]; nausea [  ]; night sweats [  ]; fever [  ]; or chills [  ];          Dental: poor dentition[  ]; Last Dentist visit:   Eye : blurred vision [  ]; diplopia [   ]; vision changes [  ];  Amaurosis fugax[  ]; Resp: cough [  ];  wheezing[ y ];  hemoptysis[n  ]; shortness of breath[ y ]; paroxysmal nocturnal dyspnea[ y ]; dyspnea on exertion[  ]; or orthopnea[  ];  GI:  gallstones[  ], vomiting[  ];  dysphagia[  ]; melena[  ];  hematochezia [  ]; heartburn[  ];   Hx of  Colonoscopy[  ]; GU: kidney stones [  ]; hematuria[  ];   dysuria [  ];  nocturia[  ];  history of     obstruction [  ]; urinary frequency [  ]             Skin: rash, swelling[  ];, hair loss[  ];  peripheral edema[  ];  or itching[  ]; Musculosketetal: myalgias[  ];  joint swelling[  ];  joint erythema[  ];  joint pain[  ];  back pain[  ];  Heme/Lymph: bruising[  ];  bleeding[  ];  anemia[  ];  Neuro: TIA[  ];  headaches[  ];  stroke[  ];  vertigo[  ];  seizures[ n ];   paresthesias[  ];  difficulty walking[  ];  Psych:depression[  ]; anxiety[  ];  Endocrine: diabetes[  n];  thyroid dysfunction[ n ];  Immunizations: Flu up to date [  ]; Pneumococcal up to date [  ];  Other:  Physical Exam: BP 111/65 mmHg  Pulse 81  Temp(Src) 98.9 F (37.2 C)  Resp 17  Ht 5\' 4"  (1.626 m)  Wt 140 lb 9.6 oz (63.776 kg)  BMI 24.12 kg/m2  SpO2 97%  PHYSICAL EXAMINATION:  General appearance: alert, cooperative and appears older than stated age Neurologic: intact Heart: regular rate and rhythm, S1, S2 normal, no murmur, click, rub or gallop Lungs: clear to auscultation bilaterally Abdomen: soft, non-tender; bowel sounds normal; no masses,  no organomegaly Extremities: extremities normal, atraumatic, no cyanosis or  edema and Homans sign is negative, no sign of DVT Wound: 2 cm soft tissue mass left abdominal wall   Diagnostic Studies & Laboratory data:     Recent Radiology Findings:   Dg Chest 2 View  06/01/2014   CLINICAL DATA:  Shortness of breath and chest pain  EXAM: CHEST  2 VIEW  COMPARISON:  None.  FINDINGS: Cardiac shadow is within normal limits. There is fullness in the right peritracheal region and right hilar region identified. Right basilar atelectasis is noted. The left lung is well aerated. No acute bony abnormality is seen.  IMPRESSION: Changes consistent with increased lymphadenopathy/possible mass in the right peritracheal region and right hilar region. Further evaluation by means of CT of the chest with contrast is recommended.  These results were called by telephone at the time of interpretation on 06/01/2014 at 3:50 pm to Dr. Rogene Houston, who verbally acknowledged these results.   Electronically Signed   By: Inez Catalina M.D.   On: 06/01/2014 15:53   Ct Chest W Contrast  06/01/2014   CLINICAL DATA:  Cough and shortness of breath. Lump on the left near the last rib.  EXAM: CT CHEST WITH CONTRAST  TECHNIQUE: Multidetector CT imaging of the chest was performed during intravenous contrast administration.  CONTRAST:  80 mL OMNIPAQUE IOHEXOL 300 MG/ML  SOLN  COMPARISON:  PA and lateral chest earlier this same day.  FINDINGS: A necrotic lymph node in the right pretracheal station on image 16 measures 3.1 x 3.1 cm. There is a large right hilar mass encasing the right mainstem bronchus and right main pulmonary artery. The mass measures 7.2 x 7.5 cm on image 28. The lesion narrows the right mainstem bronchus and appears to occlude it distally. The lesion also narrows the right main pulmonary artery. No embolus is seen.  There is no pleural or pericardial effusion.  Heart size is normal.  The lungs demonstrate emphysematous disease. There is volume loss in the right chest secondary to atelectasis related to  right mainstem bronchus occlusion. Atelectatic change is seen in the right base. There is a right lower lobe pulmonary nodule on image 39 measures 0.7 cm in diameter. A subpleural nodule in the periphery of the right middle lobe measures 0.6 cm in diameter. A nodule along the left major fissure on image 32 measures 0.4 cm. A nodule in the lingula also on image 32 measures 0.4 cm.  Visualized abdomen shows a possible punctate nonobstructing stone in the upper pole the right kidney. The adrenal glands, spleen, pancreas, gallbladder and biliary tree appear normal. No liver lesion is identified. No focal bony abnormality is seen.  IMPRESSION: Large mass centered in the right hilum consistent with bronchogenic carcinoma. As above, the lesion occludes the distal right mainstem bronchus resulting in volume loss in the right chest and markedly narrows the right main pulmonary artery. Associated metastatic pretracheal lymphadenopathy and nodules in both lungs are as described above.   Electronically Signed   By: Inge Rise M.D.   On: 06/01/2014 20:53   Mr Brain  W Wo Contrast  06/18/2014   CLINICAL DATA:  Lung cancer staging  EXAM: MRI HEAD WITHOUT AND WITH CONTRAST  TECHNIQUE: Multiplanar, multiecho pulse sequences of the brain and surrounding structures were obtained without and with intravenous contrast.  CONTRAST:  56mL MULTIHANCE GADOBENATE DIMEGLUMINE 529 MG/ML IV SOLN  COMPARISON:  None.  FINDINGS: Multiple enhancing lesions are present consistent with metastatic disease.  5 mm lesion right lower cerebellum.  7 mm right parietal enhancing lesion. Small amount of hemorrhage associated with this lesion.  6 mm enhancing lesion over the frontal parietal convexity  2 x 3 mm left parietal cortical lesion over the convexity.  Minimal edema.  No mass effect or midline shift.  Ventricle size is normal.  No acute infarct.  IMPRESSION: Four metastatic deposits in the brain. Right parietal lesion shows mild hemorrhage.    Electronically Signed   By: Franchot Gallo M.D.   On: 06/18/2014 17:17   Nm Pet Image Initial (pi) Skull Base To Thigh  06/18/2014   CLINICAL DATA:  Initial treatment strategy for lung mass.  EXAM: NUCLEAR MEDICINE PET SKULL BASE TO THIGH  TECHNIQUE: 6.8 mCi F-18 FDG was injected intravenously. Full-ring PET imaging was performed from the skull base to thigh after the radiotracer. CT data was obtained and used for attenuation correction and anatomic localization.  FASTING BLOOD GLUCOSE:  Value: 96 mg/dl  COMPARISON:  Chest CT 06/01/2014.  FINDINGS: NECK  No hypermetabolic lymph nodes in the neck.  CHEST  Again noted is a large mass centered in the right hilar region, with direct invasion of the mediastinum, measuring approximately 7.3 x 8.0 cm (SUVmax = 14.4). This mass completely encases the distal right mainstem bronchus and in the right upper lobe bronchus, which is markedly narrowed and nearly completely occluded. The right upper lobe remains patent at this time. The mass completely encases the bronchus intermedius, right middle lobe bronchi and right lower lobe bronchi, which appear completely occluded at this time, although portions of the right middle and lower lobes remain aerated. There are extensive postobstructive changes in the right lung, most severe in the right middle and lower lobes, predominantly postobstructive pneumonitis. However, some of these areas are somewhat nodular in appearance and demonstrate low levels of hypermetabolism such that lymphangitic spread of tumor throughout these areas of the lung is difficult to entirely exclude. Bulky high right paratracheal lymphadenopathy is noted measuring up to 3.9 x 3.3 cm (SUVmax = 10.6). The left lung as well aerated. No pleural effusions. Mild cardiomegaly. There is atherosclerosis of the thoracic aorta, the great vessels of the mediastinum and the coronary arteries, including calcified atherosclerotic plaque in the left main, left anterior  descending and left circumflex coronary arteries.  ABDOMEN/PELVIS  In the subcutaneous soft tissues of the left flank there is a 2.4 x 2.1 cm hypermetabolic (SUVmax = 9.1) soft tissue attenuation nodule, presumably a soft tissue metastasis. No abnormal hypermetabolic activity within the liver, pancreas, adrenal glands, or spleen. No hypermetabolic lymph nodes in the abdomen or pelvis. Well-defined 2.8 cm low-attenuation lesion in the upper pole of the left kidney was previously characterized as a simple cyst. Extensive atherosclerosis throughout the abdominal and pelvic vasculature, without definite aneurysm on today's noncontrast CT examination.  SKELETON  No destructive hypermetabolic skeletal lesions to suggest osseous metastasis at this time.  IMPRESSION: 1. 7.3 x 8.0 cm right lung mass in the right hilar region with direct mediastinal invasion, potential lymphangitic spread of tumor in the base of the right  lung, high right peritracheal lymphadenopathy, and metastatic soft tissue implant in the subcutaneous fat of the left flank. Findings are most compatible with stage IV lung cancer (T4, N2, M1b). 2. Atherosclerosis, including left main and 3 vessel coronary artery disease. 3. Additional incidental findings, as above.   Electronically Signed   By: Vinnie Langton M.D.   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 No facility to do in  clinic today, will have done in interventional radiology ASAP   I spent 40 minutes counseling the patient face to face. The total time spent in the appointment was 55 minutes.  Grace Isaac MD      [place] ,[place] [address] Office [phone]   Beeper [phone]  06/24/2014 3:38 PM

## 2014-06-24 NOTE — Therapy (Signed)
Physical Therapy Evaluation  Patient Details  Name: Mariah Park MRN: [id number] Date of Birth: 02/25/1956  Encounter Date: 06/24/2014      PT End of Session - 06/24/14 1531    Visit Number 1   Number of Visits 1   Date for PT Re-Evaluation 08/24/14   PT Start Time 1505   PT Stop Time 1516   PT Time Calculation (min) 11 min      Past Medical History  Diagnosis Date  . Brain metastases 06/21/2014    No past surgical history on file.  There were no vitals taken for this visit.  Visit Diagnosis:  Shortness of breath - Plan: PT plan of care cert/re-cert      Subjective Assessment - 06/24/14 1520    Symptoms Presented to ED with SOB and chest pain.   Pertinent History Abnormal chest x-ray, then CT chest, PET, brain MR (showing several metastases, including cerebellum).  Pathology not yet available; needs biopsy.  May have chemo and whole brainradiation.   Currently in Pain? No/denies          [facility] PT Assessment - 06/24/14 0001    Precautions   Precautions Other (comment)   Precaution Comments cancer precautions   Balance Screen   Has the patient fallen in the past 6 months No   Has the patient had a decrease in activity level because of a fear of falling?  No   Is the patient reluctant to leave their home because of a fear of falling?  No   Home Environment   Living Enviornment Private residence   Living Arrangements Children  Will be living with son in handicap accessible housing   Sensation   Light Touch Not tested  Pt. denies numbness   Posture/Postural Control   Posture/Postural Control Postural limitations   Postural Limitations Forward head   AROM   Overall AROM  Within functional limits for tasks performed   Overall AROM Comments Did standing trunk flexion   Strength   Overall Strength Other (comment)   Overall Strength Comments Pt. denied weakness for functional activities.   Ambulation/Gait   Ambulation/Gait Yes   Ambulation/Gait Assistance 7:  Independent  reports she walks 1 1/2 miles once or twice a day   Ambulation Distance (Feet) --  Limited by shortness of breath for walking uphill   Assistive device None   Stairs Yes   Stair Management Technique Other (comment)  reports being slow on stairs   Balance   Balance Assessed Yes   Dynamic Standing Balance   Dynamic Standing - Comments --  reaches 12 inches in standing            PT Education - 06/24/14 1530    Education provided Yes   Education Details posture, breathing, walking, energy conservation   Person(s) Educated Patient   Methods Explanation;Demonstration;Handout   Comprehension Verbalized understanding              Plan - 06/24/14 1531    Clinical Impression Statement Patient who walks regularly but does report shortness of breath with inclines.   Pt will benefit from skilled therapeutic intervention in order to improve on the following deficits Cardiopulmonary status limiting activity   Rehab Potential Fair   PT Frequency One time visit   PT Treatment/Interventions Patient/family education   PT Next Visit Plan None at this time   Consulted and Agree with Plan of Care Patient        Problem List Patient Active Problem  List   Diagnosis Date Noted  . Brain metastases 06/21/2014  . Lung mass 06/09/2014                                        Lung Clinic Goals - 06/24/14 1533    Patient will be able to verbalize understanding of the benefit of exercise to decrease fatigue.   Status Achieved   Patient will be able to verbalize the importance of posture.   Status Achieved   Patient will be able to demonstrate diaphragmatic breathing for improved lung function.   Status Achieved   Patient will be able to verbalize understanding of the role of physical therapy to prevent functional decline and who to contact if physical therapy is needed.   Status Achieved              [place],DONNA 06/24/2014,  3:36 PM  Ethan, PT

## 2014-06-24 NOTE — Progress Notes (Signed)
Radiation Oncology         [phone] ________________________________  Initial Outpatient Consultation  Name: Mariah Park MRN: [id number]  Date: 06/24/2014  DOB: 07-31-1956  CC:No PCP Per Patient  Curt Bears, MD   REFERRING PHYSICIAN: Curt Bears, MD  DIAGNOSIS: probable stage IV lung cancer (biopsy pending)(T4, N2, M1b)  HISTORY OF PRESENT ILLNESS::Mariah Park is a 58 y.o. female who is seen out courtesy of Dr. Julien Nordmann as part of the multidisciplinary thoracic clinic. The Patient presented with recent history of progressive shortness of breath, chest pain and a productive cough of yelow sputum. Patient also palpated a nodule along the left abdominal region. The patient presented to the emergency room and a chest CT scan revealed a large mass in the right central chest area measuring approximately 7.2 x 7.5 cm. The lesion narrowed the right mainstem bronchus. A subsequent PET scan documented hyper metabolic activity in the left abdominal nodule as well as significant disease within the chest. An MRI the brain showed at least 4 small metastasis. With This information the patient is now seen in consultation and for consideration for radiation therapy as part of her management.Marland Kitchen  PREVIOUS RADIATION THERAPY: No  PAST MEDICAL HISTORY:  has a past medical history of Brain metastases (06/21/2014).    PAST SURGICAL HISTORY:No past surgical history on file.  FAMILY HISTORY: family history includes Cancer in her sister; Stroke in her mother.  SOCIAL HISTORY:  reports that she quit smoking about 3 weeks ago. Her smoking use included Cigarettes. She smoked 0.50 packs per day. She does not have any smokeless tobacco history on file. She reports that she drinks alcohol. She reports that she uses illicit drugs (Marijuana) about 14 times per week.  ALLERGIES: Review of patient's allergies indicates no known allergies.  MEDICATIONS:  Current Outpatient Prescriptions  Medication  Sig Dispense Refill  . dexamethasone (DECADRON) 4 MG tablet Take 1 tablet (4 mg total) by mouth 2 (two) times daily. 30 tablet 0  . oxyCODONE-acetaminophen (PERCOCET) 5-325 MG per tablet Take 1-2 tablets by mouth every 4 (four) hours as needed. 20 tablet 0   No current facility-administered medications for this encounter.    REVIEW OF SYSTEMS:  A 15 point review of systems is documented in the electronic medical record. This was obtained by the nursing staff. However, I reviewed this with the patient to discuss relevant findings and make appropriate changes.  Progressive shortness of breath and right-sided chest pain. Patient denies any hemoptysis. She denies any headaches, nausea or visual problems.   PHYSICAL EXAM:  Vitals - 1 value per visit 96/78/9381  SYSTOLIC 017  DIASTOLIC 65  Pulse 81  Temperature 98.9  Respirations 17  Weight (lb) 140.6  Height 5\' 4"   BMI 24.12  VISIT REPORT      General Appearance:    Alert, cooperative, no distress, appears stated age  Head:    Normocephalic, without obvious abnormality, atraumatic  Eyes:    PERRL, conjunctiva/corneas clear, EOM's intact,        Nose:   Nares normal, septum midline, mucosa normal, no drainage    or sinus tenderness  Throat:   Lips, mucosa, and tongue normal;poor dentition, gums normal  Neck:   Supple, symmetrical, trachea midline, no adenopathy;    thyroid:  no enlargement/tenderness/nodules; no carotid   bruit or JVD  Back:     Symmetric, no curvature, ROM normal, no CVA tenderness  Lungs:     Clear to auscultation bilaterally, respirations unlabored  Chest Wall:    No tenderness or deformity   Heart:    Regular rate and rhythm, S1 and S2 normal, no murmur, rub   or gallop     Abdomen:     Soft, non-tender, bowel sounds active all four quadrants,    no masses, no organomegaly, 4 cm nodule palpable in the soft tissue of the left lateral abdominal region        Extremities:   Extremities normal, atraumatic, no  cyanosis or edema  Pulses:   2+ and symmetric all extremities  Skin:   Skin color, texture, turgor normal, no rashes or lesions  Lymph nodes:   Cervical, supraclavicular, and axillary nodes normal  Neurologic:   CNII-XII intact, normal strength, sensation and reflexes    throughout     ECOG = 1  1 - Symptomatic but completely ambulatory (Restricted in physically strenuous activity but ambulatory and able to carry out work of a light or sedentary nature. For example, light housework, office work)  LABORATORY DATA:  Lab Results  Component Value Date   WBC 8.8 06/09/2014   HGB 11.8 06/09/2014   HCT 34.8 06/09/2014   MCV 90.4 06/09/2014   PLT 257 06/09/2014   NEUTROABS 6.5 06/09/2014   Lab Results  Component Value Date   NA 140 06/09/2014   K 3.8 06/09/2014   CL 106 06/01/2014   CO2 27 06/09/2014   GLUCOSE 110 06/09/2014   CREATININE 0.7 06/09/2014   CALCIUM 10.0 06/09/2014      RADIOGRAPHY: Dg Chest 2 View  06/01/2014   CLINICAL DATA:  Shortness of breath and chest pain  EXAM: CHEST  2 VIEW  COMPARISON:  None.  FINDINGS: Cardiac shadow is within normal limits. There is fullness in the right peritracheal region and right hilar region identified. Right basilar atelectasis is noted. The left lung is well aerated. No acute bony abnormality is seen.  IMPRESSION: Changes consistent with increased lymphadenopathy/possible mass in the right peritracheal region and right hilar region. Further evaluation by means of CT of the chest with contrast is recommended.  These results were called by telephone at the time of interpretation on 06/01/2014 at 3:50 pm to Dr. Rogene Houston, who verbally acknowledged these results.   Electronically Signed   By: Inez Catalina M.D.   On: 06/01/2014 15:53   Ct Chest W Contrast  06/01/2014   CLINICAL DATA:  Cough and shortness of breath. Lump on the left near the last rib.  EXAM: CT CHEST WITH CONTRAST  TECHNIQUE: Multidetector CT imaging of the chest was  performed during intravenous contrast administration.  CONTRAST:  80 mL OMNIPAQUE IOHEXOL 300 MG/ML  SOLN  COMPARISON:  PA and lateral chest earlier this same day.  FINDINGS: A necrotic lymph node in the right pretracheal station on image 16 measures 3.1 x 3.1 cm. There is a large right hilar mass encasing the right mainstem bronchus and right main pulmonary artery. The mass measures 7.2 x 7.5 cm on image 28. The lesion narrows the right mainstem bronchus and appears to occlude it distally. The lesion also narrows the right main pulmonary artery. No embolus is seen.  There is no pleural or pericardial effusion.  Heart size is normal.  The lungs demonstrate emphysematous disease. There is volume loss in the right chest secondary to atelectasis related to right mainstem bronchus occlusion. Atelectatic change is seen in the right base. There is a right lower lobe pulmonary nodule on image 39 measures 0.7 cm in diameter. A  subpleural nodule in the periphery of the right middle lobe measures 0.6 cm in diameter. A nodule along the left major fissure on image 32 measures 0.4 cm. A nodule in the lingula also on image 32 measures 0.4 cm.  Visualized abdomen shows a possible punctate nonobstructing stone in the upper pole the right kidney. The adrenal glands, spleen, pancreas, gallbladder and biliary tree appear normal. No liver lesion is identified. No focal bony abnormality is seen.  IMPRESSION: Large mass centered in the right hilum consistent with bronchogenic carcinoma. As above, the lesion occludes the distal right mainstem bronchus resulting in volume loss in the right chest and markedly narrows the right main pulmonary artery. Associated metastatic pretracheal lymphadenopathy and nodules in both lungs are as described above.   Electronically Signed   By: Inge Rise M.D.   On: 06/01/2014 20:53   Mr Jeri Cos ID Contrast  06/18/2014   CLINICAL DATA:  Lung cancer staging  EXAM: MRI HEAD WITHOUT AND WITH CONTRAST   TECHNIQUE: Multiplanar, multiecho pulse sequences of the brain and surrounding structures were obtained without and with intravenous contrast.  CONTRAST:  32mL MULTIHANCE GADOBENATE DIMEGLUMINE 529 MG/ML IV SOLN  COMPARISON:  None.  FINDINGS: Multiple enhancing lesions are present consistent with metastatic disease.  5 mm lesion right lower cerebellum.  7 mm right parietal enhancing lesion. Small amount of hemorrhage associated with this lesion.  6 mm enhancing lesion over the frontal parietal convexity  2 x 3 mm left parietal cortical lesion over the convexity.  Minimal edema.  No mass effect or midline shift.  Ventricle size is normal.  No acute infarct.  IMPRESSION: Four metastatic deposits in the brain. Right parietal lesion shows mild hemorrhage.   Electronically Signed   By: Franchot Gallo M.D.   On: 06/18/2014 17:17   Nm Pet Image Initial (pi) Skull Base To Thigh  06/18/2014   CLINICAL DATA:  Initial treatment strategy for lung mass.  EXAM: NUCLEAR MEDICINE PET SKULL BASE TO THIGH  TECHNIQUE: 6.8 mCi F-18 FDG was injected intravenously. Full-ring PET imaging was performed from the skull base to thigh after the radiotracer. CT data was obtained and used for attenuation correction and anatomic localization.  FASTING BLOOD GLUCOSE:  Value: 96 mg/dl  COMPARISON:  Chest CT 06/01/2014.  FINDINGS: NECK  No hypermetabolic lymph nodes in the neck.  CHEST  Again noted is a large mass centered in the right hilar region, with direct invasion of the mediastinum, measuring approximately 7.3 x 8.0 cm (SUVmax = 14.4). This mass completely encases the distal right mainstem bronchus and in the right upper lobe bronchus, which is markedly narrowed and nearly completely occluded. The right upper lobe remains patent at this time. The mass completely encases the bronchus intermedius, right middle lobe bronchi and right lower lobe bronchi, which appear completely occluded at this time, although portions of the right middle and  lower lobes remain aerated. There are extensive postobstructive changes in the right lung, most severe in the right middle and lower lobes, predominantly postobstructive pneumonitis. However, some of these areas are somewhat nodular in appearance and demonstrate low levels of hypermetabolism such that lymphangitic spread of tumor throughout these areas of the lung is difficult to entirely exclude. Bulky high right paratracheal lymphadenopathy is noted measuring up to 3.9 x 3.3 cm (SUVmax = 10.6). The left lung as well aerated. No pleural effusions. Mild cardiomegaly. There is atherosclerosis of the thoracic aorta, the great vessels of the mediastinum and the coronary arteries,  including calcified atherosclerotic plaque in the left main, left anterior descending and left circumflex coronary arteries.  ABDOMEN/PELVIS  In the subcutaneous soft tissues of the left flank there is a 2.4 x 2.1 cm hypermetabolic (SUVmax = 9.1) soft tissue attenuation nodule, presumably a soft tissue metastasis. No abnormal hypermetabolic activity within the liver, pancreas, adrenal glands, or spleen. No hypermetabolic lymph nodes in the abdomen or pelvis. Well-defined 2.8 cm low-attenuation lesion in the upper pole of the left kidney was previously characterized as a simple cyst. Extensive atherosclerosis throughout the abdominal and pelvic vasculature, without definite aneurysm on today's noncontrast CT examination.  SKELETON  No destructive hypermetabolic skeletal lesions to suggest osseous metastasis at this time.  IMPRESSION: 1. 7.3 x 8.0 cm right lung mass in the right hilar region with direct mediastinal invasion, potential lymphangitic spread of tumor in the base of the right lung, high right peritracheal lymphadenopathy, and metastatic soft tissue implant in the subcutaneous fat of the left flank. Findings are most compatible with stage IV lung cancer (T4, N2, M1b). 2. Atherosclerosis, including left main and 3 vessel coronary  artery disease. 3. Additional incidental findings, as above.   Electronically Signed   By: Vinnie Langton M.D.   On: 06/18/2014 15:32      IMPRESSION: probable stage IV lung cancer (T4, N2, M1b) (biopsy pending). The patient has been placed on Decadron for her brain metastasis. If the patient's pathology returns non-small cell lung cancer then she would be a candidate for palliative radiation treatments directed at the brain and chest . Her performance status is reasonable and she may be a candidate for stereotactic radiosurgery for the small brain lesions. If the patient has small cell lung cancer then she will likely proceed with systemic chemotherapy. Since her brain lesions are quite small these could be followed and consider whole brain radiation therapy at a later date depending on her response to chemotherapy (as these lesions may respond to systemic chemotherapy).  PLAN:final plans depending on results of biopsy early next week.     ------------------------------------------------  Blair Promise, PhD, MD

## 2014-06-24 NOTE — Progress Notes (Signed)
[facility] Clinical Social Work  Clinical Social Work met with patient at [facility] appointment to offer support and assess for psychosocial needs.  Patient reports she needs biopsy to determine cancer type and then plans to proceed with chemoradiation.  Ms. Wileman main concern at this time is housing.  She is living with a friend but it is not a healthy living situation.  Her son plans to move to [facility] to support her and she can reside with him at that time.  CSW will explore housing resources for short-term need.  CSW connected patient with financial advocate to assist with medicaid and [employer].  Clinical Social Work briefly discussed Clinical Social Work role and [employer] support programs/services.  Clinical Social Work encouraged patient to call with any additional questions or concerns.   Polo Riley, MSW, LCSW, OSW-C Clinical Social Worker [facility] [phone]

## 2014-06-25 ENCOUNTER — Telehealth: Payer: Self-pay | Admitting: *Deleted

## 2014-06-25 ENCOUNTER — Other Ambulatory Visit: Payer: Self-pay

## 2014-06-25 ENCOUNTER — Encounter: Payer: Self-pay | Admitting: *Deleted

## 2014-06-25 DIAGNOSIS — R1900 Intra-abdominal and pelvic swelling, mass and lump, unspecified site: Secondary | ICD-10-CM

## 2014-06-25 NOTE — Telephone Encounter (Signed)
Called to check on patient after thoracic clinic yesterday to see if she had any questions.  She stated she was doing ok without any questions.  She felt all her questions were answered yesterday.  She is waiting to get scheduled for her biopsy.  Thoracic surgery office will set patient up for biopsy and I explained to patient they will call her.

## 2014-06-25 NOTE — [facility] Oncology Navigator Note (Unsigned)
   Thoracic Treatment Summary Name:Mariah Park Date:06/25/2014 DOB:1956/01/02 Your Medical Team Medical Oncologist:Dr. Julien Nordmann Radiation Oncologist:Dr. Sondra Come Surgeon:Dr.Gerhardt Type and Stage of Lung Cancer Unknown type as of 06/25/14  Clinical Stage:  No matching staging information was found for the patient.   Clinical stage is based on radiology exams.  Pathological stage will be determined after surgery.  Staging is based on the size of the tumor, involvement of lymph nodes or not, and whether or not the cancer center has spread. Recommendations Recommendations: Tissue diagnosis then chemo and radiation   These recommendations are based on information available as of today's consult.  This is subject to change depending further testing or exams. Next Steps Next Step: Thoracic surgery office will set up appointment for biopsy Medical Oncology will set up appointments for follow up Radiation Oncology will set up appointments for follow up Barriers to Care What do you perceive as a potential barrier that may prevent you from receiving your treatment plan? Education information given about biopsy Financial Social Worker helping patient find support/Also, financial advocates helping patient    Resources Given: Information given on biopsy and resources at [employer].       Questions Norton Blizzard, RN BSN Thoracic Oncology Nurse Navigator at [facility] is a nurse navigator that is available to assist you through your cancer journey.  She can answer your questions and/or provide resources regarding your treatment plan, emotional support, or financial concerns.

## 2014-06-26 NOTE — Progress Notes (Signed)
[place] [phone]   [phone]  OFFICE PROGRESS NOTE  No PCP Per Patient No address on file  DIAGNOSIS: questionable metastatic lung cancer, pending tissue diagnosis  PRIOR THERAPY: None  CURRENT THERAPY: None  INTERVAL HISTORY: Mariah Park 58 y.o. female returns to the clinic today for follow-up visit. The patient was seen 2 weeks ago for initial evaluation and I ordered several studies including a PET scan and MRI of the brain for evaluation of her disease. I also referred her to cardiothoracic surgery for evaluation but she did not show up for her appointment. She is feeling fine today with no specific complaints. She denied having any significant chest pain, shortness breath, cough or hemoptysis. The patient denied having any fever or chills, no nausea or vomiting. She denied having any significant weight loss or night sweats. She is here today for evaluation at the multidisciplinary thoracic oncology clinic.  MEDICAL HISTORY: Past Medical History  Diagnosis Date  . Brain metastases 06/21/2014    ALLERGIES:  has No Known Allergies.  MEDICATIONS:  Current Outpatient Prescriptions  Medication Sig Dispense Refill  . dexamethasone (DECADRON) 4 MG tablet Take 1 tablet (4 mg total) by mouth 2 (two) times daily. 30 tablet 0  . oxyCODONE-acetaminophen (PERCOCET) 5-325 MG per tablet Take 1-2 tablets by mouth every 4 (four) hours as needed. 20 tablet 0   No current facility-administered medications for this visit.    SURGICAL HISTORY: History reviewed. No pertinent past surgical history.  REVIEW OF SYSTEMS:  Constitutional: negative Eyes: negative Ears, nose, mouth, throat, and face: negative Respiratory: negative Cardiovascular: negative Gastrointestinal: negative Genitourinary:negative Integument/breast: negative Hematologic/lymphatic: negative Musculoskeletal:negative Neurological: negative Behavioral/Psych:  negative Endocrine: negative Allergic/Immunologic: negative   PHYSICAL EXAMINATION: General appearance: alert, cooperative and no distress Head: Normocephalic, without obvious abnormality, atraumatic Neck: no adenopathy, no JVD, supple, symmetrical, trachea midline and thyroid not enlarged, symmetric, no tenderness/mass/nodules Lymph nodes: Cervical, supraclavicular, and axillary nodes normal. Resp: clear to auscultation bilaterally Back: symmetric, no curvature. ROM normal. No CVA tenderness. Cardio: regular rate and rhythm, S1, S2 normal, no murmur, click, rub or gallop GI: soft, non-tender; bowel sounds normal; no masses,  no organomegaly Extremities: extremities normal, atraumatic, no cyanosis or edema Neurologic: Alert and oriented X 3, normal strength and tone. Normal symmetric reflexes. Normal coordination and gait  ECOG PERFORMANCE STATUS: 1 - Symptomatic but completely ambulatory  Blood pressure 111/65, pulse 81, temperature 98.9 F (37.2 C), temperature source Oral, resp. rate 17, height 5\' 4"  (1.626 m), weight 140 lb 9.6 oz (63.776 kg), SpO2 97 %.  LABORATORY DATA: Lab Results  Component Value Date   WBC 8.8 06/09/2014   HGB 11.8 06/09/2014   HCT 34.8 06/09/2014   MCV 90.4 06/09/2014   PLT 257 06/09/2014      Chemistry      Component Value Date/Time   NA 140 06/09/2014 1133   NA 144 06/01/2014 1614   K 3.8 06/09/2014 1133   K 3.9 06/01/2014 1614   CL 106 06/01/2014 1614   CO2 27 06/09/2014 1133   CO2 25 06/01/2014 1614   BUN 6.7* 06/09/2014 1133   BUN 5* 06/01/2014 1614   CREATININE 0.7 06/09/2014 1133   CREATININE 0.55 06/01/2014 1614      Component Value Date/Time   CALCIUM 10.0 06/09/2014 1133   CALCIUM 9.8 06/01/2014 1614   ALKPHOS 104 06/09/2014 1133   AST 49* 06/09/2014 1133   ALT 106* 06/09/2014 1133  BILITOT 0.47 06/09/2014 1133       RADIOGRAPHIC STUDIES: Dg Chest 2 View  06/01/2014   CLINICAL DATA:  Shortness of breath and chest pain   EXAM: CHEST  2 VIEW  COMPARISON:  None.  FINDINGS: Cardiac shadow is within normal limits. There is fullness in the right peritracheal region and right hilar region identified. Right basilar atelectasis is noted. The left lung is well aerated. No acute bony abnormality is seen.  IMPRESSION: Changes consistent with increased lymphadenopathy/possible mass in the right peritracheal region and right hilar region. Further evaluation by means of CT of the chest with contrast is recommended.  These results were called by telephone at the time of interpretation on 06/01/2014 at 3:50 pm to Dr. Rogene Houston, who verbally acknowledged these results.   Electronically Signed   By: Inez Catalina M.D.   On: 06/01/2014 15:53   Ct Chest W Contrast  06/01/2014   CLINICAL DATA:  Cough and shortness of breath. Lump on the left near the last rib.  EXAM: CT CHEST WITH CONTRAST  TECHNIQUE: Multidetector CT imaging of the chest was performed during intravenous contrast administration.  CONTRAST:  80 mL OMNIPAQUE IOHEXOL 300 MG/ML  SOLN  COMPARISON:  PA and lateral chest earlier this same day.  FINDINGS: A necrotic lymph node in the right pretracheal station on image 16 measures 3.1 x 3.1 cm. There is a large right hilar mass encasing the right mainstem bronchus and right main pulmonary artery. The mass measures 7.2 x 7.5 cm on image 28. The lesion narrows the right mainstem bronchus and appears to occlude it distally. The lesion also narrows the right main pulmonary artery. No embolus is seen.  There is no pleural or pericardial effusion.  Heart size is normal.  The lungs demonstrate emphysematous disease. There is volume loss in the right chest secondary to atelectasis related to right mainstem bronchus occlusion. Atelectatic change is seen in the right base. There is a right lower lobe pulmonary nodule on image 39 measures 0.7 cm in diameter. A subpleural nodule in the periphery of the right middle lobe measures 0.6 cm in diameter. A  nodule along the left major fissure on image 32 measures 0.4 cm. A nodule in the lingula also on image 32 measures 0.4 cm.  Visualized abdomen shows a possible punctate nonobstructing stone in the upper pole the right kidney. The adrenal glands, spleen, pancreas, gallbladder and biliary tree appear normal. No liver lesion is identified. No focal bony abnormality is seen.  IMPRESSION: Large mass centered in the right hilum consistent with bronchogenic carcinoma. As above, the lesion occludes the distal right mainstem bronchus resulting in volume loss in the right chest and markedly narrows the right main pulmonary artery. Associated metastatic pretracheal lymphadenopathy and nodules in both lungs are as described above.   Electronically Signed   By: Inge Rise M.D.   On: 06/01/2014 20:53   Mr Jeri Cos XT Contrast  06/18/2014   CLINICAL DATA:  Lung cancer staging  EXAM: MRI HEAD WITHOUT AND WITH CONTRAST  TECHNIQUE: Multiplanar, multiecho pulse sequences of the brain and surrounding structures were obtained without and with intravenous contrast.  CONTRAST:  65mL MULTIHANCE GADOBENATE DIMEGLUMINE 529 MG/ML IV SOLN  COMPARISON:  None.  FINDINGS: Multiple enhancing lesions are present consistent with metastatic disease.  5 mm lesion right lower cerebellum.  7 mm right parietal enhancing lesion. Small amount of hemorrhage associated with this lesion.  6 mm enhancing lesion over the frontal parietal convexity  2 x 3 mm left parietal cortical lesion over the convexity.  Minimal edema.  No mass effect or midline shift.  Ventricle size is normal.  No acute infarct.  IMPRESSION: Four metastatic deposits in the brain. Right parietal lesion shows mild hemorrhage.   Electronically Signed   By: Franchot Gallo M.D.   On: 06/18/2014 17:17   Nm Pet Image Initial (pi) Skull Base To Thigh  06/18/2014   CLINICAL DATA:  Initial treatment strategy for lung mass.  EXAM: NUCLEAR MEDICINE PET SKULL BASE TO THIGH  TECHNIQUE: 6.8  mCi F-18 FDG was injected intravenously. Full-ring PET imaging was performed from the skull base to thigh after the radiotracer. CT data was obtained and used for attenuation correction and anatomic localization.  FASTING BLOOD GLUCOSE:  Value: 96 mg/dl  COMPARISON:  Chest CT 06/01/2014.  FINDINGS: NECK  No hypermetabolic lymph nodes in the neck.  CHEST  Again noted is a large mass centered in the right hilar region, with direct invasion of the mediastinum, measuring approximately 7.3 x 8.0 cm (SUVmax = 14.4). This mass completely encases the distal right mainstem bronchus and in the right upper lobe bronchus, which is markedly narrowed and nearly completely occluded. The right upper lobe remains patent at this time. The mass completely encases the bronchus intermedius, right middle lobe bronchi and right lower lobe bronchi, which appear completely occluded at this time, although portions of the right middle and lower lobes remain aerated. There are extensive postobstructive changes in the right lung, most severe in the right middle and lower lobes, predominantly postobstructive pneumonitis. However, some of these areas are somewhat nodular in appearance and demonstrate low levels of hypermetabolism such that lymphangitic spread of tumor throughout these areas of the lung is difficult to entirely exclude. Bulky high right paratracheal lymphadenopathy is noted measuring up to 3.9 x 3.3 cm (SUVmax = 10.6). The left lung as well aerated. No pleural effusions. Mild cardiomegaly. There is atherosclerosis of the thoracic aorta, the great vessels of the mediastinum and the coronary arteries, including calcified atherosclerotic plaque in the left main, left anterior descending and left circumflex coronary arteries.  ABDOMEN/PELVIS  In the subcutaneous soft tissues of the left flank there is a 2.4 x 2.1 cm hypermetabolic (SUVmax = 9.1) soft tissue attenuation nodule, presumably a soft tissue metastasis. No abnormal  hypermetabolic activity within the liver, pancreas, adrenal glands, or spleen. No hypermetabolic lymph nodes in the abdomen or pelvis. Well-defined 2.8 cm low-attenuation lesion in the upper pole of the left kidney was previously characterized as a simple cyst. Extensive atherosclerosis throughout the abdominal and pelvic vasculature, without definite aneurysm on today's noncontrast CT examination.  SKELETON  No destructive hypermetabolic skeletal lesions to suggest osseous metastasis at this time.  IMPRESSION: 1. 7.3 x 8.0 cm right lung mass in the right hilar region with direct mediastinal invasion, potential lymphangitic spread of tumor in the base of the right lung, high right peritracheal lymphadenopathy, and metastatic soft tissue implant in the subcutaneous fat of the left flank. Findings are most compatible with stage IV lung cancer (T4, N2, M1b). 2. Atherosclerosis, including left main and 3 vessel coronary artery disease. 3. Additional incidental findings, as above.   Electronically Signed   By: Vinnie Langton M.D.   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 I discussed the imaging studies with the patient today. She will see Dr. Servando Snare later today for evaluation and consideration of tissue diagnosis. I started the patient on Decadron 4 mg by mouth twice a day for the metastatic brain lesions. The patient would also see Dr. Sondra Come for evaluation and discussion of the radiotherapy option for his brain lesions. The patient would come back for follow-up visit in one week for reevaluation and discussion of her treatment options based on the final  pathology.    The patient voices understanding of current disease status and treatment options and is in agreement with the current care plan.  All questions were answered. The patient knows to call the clinic with any problems, questions or concerns. We can certainly see the patient much sooner if necessary.  I spent 15 minutes counseling the patient face to face. The total time spent in the appointment was 25 minutes.  Disclaimer: This note was dictated with voice recognition software. Similar sounding words can inadvertently be transcribed and may not be corrected upon review.

## 2014-06-27 ENCOUNTER — Encounter: Payer: Self-pay | Admitting: Radiation Oncology

## 2014-06-28 ENCOUNTER — Telehealth: Payer: Self-pay | Admitting: Internal Medicine

## 2014-06-28 NOTE — Telephone Encounter (Signed)
s.w. pt and advised on NOV appt....pt ok and aware °

## 2014-06-29 ENCOUNTER — Telehealth: Payer: Self-pay | Admitting: Medical Oncology

## 2014-06-29 ENCOUNTER — Other Ambulatory Visit: Payer: Self-pay | Admitting: Radiology

## 2014-06-29 ENCOUNTER — Telehealth: Payer: Self-pay | Admitting: *Deleted

## 2014-06-29 NOTE — Telephone Encounter (Signed)
I called radiology dept and patient needs a ride home due to sedation.  I called care management and spoke with Camellia Woods.  She will obtain taxi falture for patient to get home.

## 2014-06-29 NOTE — Telephone Encounter (Signed)
Called Mariah Park back.  I stated I had arranged for her to receive a taxi valture for her to get home after procedure. She was thankful for the help.  I also called radiology dept and gave them an update on patient and transportation.

## 2014-06-29 NOTE — Telephone Encounter (Signed)
Spoke with patient today regarding biopsy appt.  She is able to get to [facility] but unable to get a ride home.  I called short stay at [facility] to see if she could take the bus home from biopsy.  Nurse will call me back after she speaks with PA in radiology dept.

## 2014-06-29 NOTE — Telephone Encounter (Signed)
Son said that his mother cannot live with him due to his handicap status and apt regulations. I sent note to Lauren to f/u.

## 2014-06-30 ENCOUNTER — Ambulatory Visit (HOSPITAL_COMMUNITY)
Admission: RE | Admit: 2014-06-30 | Discharge: 2014-06-30 | Disposition: A | Discharge status: Home / Self Care | Payer: Self-pay | Source: Ambulatory Visit | Attending: Cardiothoracic Surgery | Admitting: Cardiothoracic Surgery

## 2014-06-30 ENCOUNTER — Ambulatory Visit: Payer: Self-pay

## 2014-06-30 ENCOUNTER — Ambulatory Visit: Payer: Self-pay | Admitting: Radiation Oncology

## 2014-06-30 ENCOUNTER — Other Ambulatory Visit: Payer: Self-pay | Admitting: Cardiothoracic Surgery

## 2014-06-30 DIAGNOSIS — C7931 Secondary malignant neoplasm of brain: Secondary | ICD-10-CM | POA: Insufficient documentation

## 2014-06-30 DIAGNOSIS — C7989 Secondary malignant neoplasm of other specified sites: Secondary | ICD-10-CM | POA: Insufficient documentation

## 2014-06-30 DIAGNOSIS — Z87891 Personal history of nicotine dependence: Secondary | ICD-10-CM | POA: Insufficient documentation

## 2014-06-30 DIAGNOSIS — R1900 Intra-abdominal and pelvic swelling, mass and lump, unspecified site: Secondary | ICD-10-CM

## 2014-06-30 DIAGNOSIS — C3402 Malignant neoplasm of left main bronchus: Secondary | ICD-10-CM | POA: Insufficient documentation

## 2014-06-30 LAB — CBC
HCT: 35.6 % — ABNORMAL LOW (ref 36.0–46.0)
HEMOGLOBIN: 11.9 g/dL — AB (ref 12.0–15.0)
MCH: 30.8 pg (ref 26.0–34.0)
MCHC: 33.4 g/dL (ref 30.0–36.0)
MCV: 92.2 fL (ref 78.0–100.0)
PLATELETS: 353 10*3/uL (ref 150–400)
RBC: 3.86 MIL/uL — AB (ref 3.87–5.11)
RDW: 16 % — ABNORMAL HIGH (ref 11.5–15.5)
WBC: 15.4 10*3/uL — AB (ref 4.0–10.5)

## 2014-06-30 LAB — APTT: aPTT: 25 seconds (ref 24–37)

## 2014-06-30 LAB — PROTIME-INR
INR: 0.97 (ref 0.00–1.49)
Prothrombin Time: 13 seconds (ref 11.6–15.2)

## 2014-06-30 MED ORDER — HYDROCODONE-ACETAMINOPHEN 5-325 MG PO TABS
1.0000 | ORAL_TABLET | ORAL | Status: DC | PRN
  Filled 2014-06-30: qty 2

## 2014-06-30 MED ORDER — SODIUM CHLORIDE 0.9 % IV SOLN
Freq: Once | INTRAVENOUS | Status: AC
  Administered 2014-06-30: 13:00:00 via INTRAVENOUS

## 2014-06-30 MED ORDER — MIDAZOLAM HCL 2 MG/2ML IJ SOLN
INTRAMUSCULAR | Status: AC
  Filled 2014-06-30: qty 2

## 2014-06-30 MED ORDER — LIDOCAINE HCL (PF) 1 % IJ SOLN
INTRAMUSCULAR | Status: AC
  Filled 2014-06-30: qty 10

## 2014-06-30 MED ORDER — FENTANYL CITRATE 0.05 MG/ML IJ SOLN
INTRAMUSCULAR | Status: AC
  Filled 2014-06-30: qty 2

## 2014-06-30 NOTE — Sedation Documentation (Signed)
MD has discussed with Pt about proceeding without sedation, pt is in agreance, and procedure has begun.

## 2014-06-30 NOTE — Sedation Documentation (Signed)
Procedure complete pt tolerated well, denies any pain discomfort at this time. Site appears C/D/I, bandaid applied to the site.

## 2014-06-30 NOTE — H&P (Signed)
Chief Complaint: "I'm having a biopsy"  Referring Physician(s): Gerhardt,Edward B  History of Present Illness: Mariah Park is a 58 y.o. 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 She presents today for US guided biopsy of the left flank/abd wall ST mass for confirmation.  Past Medical History  Diagnosis Date  . Brain metastases 06/21/2014    No past surgical history on file.  Allergies: Review of patient's allergies indicates no known allergies.  Medications: Prior to Admission medications   Medication Sig Start Date End Date Taking? Authorizing Provider  dexamethasone (DECADRON) 4 MG tablet Take 1 tablet (4 mg total) by mouth 2 (two) times daily. 06/21/14  Yes Curt Bears, MD  Multiple Vitamins-Minerals (MULTIVITAMIN WITH MINERALS) tablet Take 1 tablet by mouth daily.   Yes Historical Provider, MD  oxyCODONE-acetaminophen (PERCOCET) 5-325 MG per tablet Take 1-2 tablets by mouth every 4 (four) hours as needed. Patient not taking: Reported on 06/29/2014 06/02/14   Margarita Mail, PA-C    Family History  Problem Relation Age of Onset  . Stroke Mother   . Cancer Sister     History   Social History  . Marital Status: Single    Spouse Name: N/A    Number of Children: N/A  . Years of Education: N/A   Social History Main Topics  . Smoking status: Former Smoker -- 0.50 packs/day    Types: Cigarettes    Quit date: 06/02/2014  . Smokeless tobacco: Not on file  . Alcohol Use: Yes  . Drug Use: 14.00 per week    Special: Marijuana  . Sexual Activity: Not Currently   Other Topics Concern  . Not on file   Social History Narrative         Review of Systems  Constitutional: Negative  for fever and chills.  Respiratory:       Occ cough, dyspnea with exertion; no hemoptysis  Cardiovascular:       Occ chest discomfort, currently pain free  Gastrointestinal: Negative for nausea, vomiting, abdominal pain and blood in stool.  Genitourinary: Negative for dysuria and hematuria.  Musculoskeletal: Negative for back pain.  Neurological: Negative for headaches.  Hematological: Does not bruise/bleed easily.      Vital Signs: BP 131/85  HR 79  R 20  TEMP 98.6  O2 SATS 98% RA  Physical Exam  Constitutional: She is oriented to person, place, and time. She appears well-developed and well-nourished.  Cardiovascular: Normal rate and regular rhythm.   Pulmonary/Chest: Effort normal.  Sl dim BS rt base, left clear  Abdominal: Soft. Bowel sounds are normal.  Palpable firm ST mass left lateral abdominal region,NT  Musculoskeletal: Normal range of motion. She exhibits no edema.  Neurological: She is alert and oriented to person, place, and time.    Imaging: Dg Chest 2 View  06/01/2014   CLINICAL DATA:  Shortness of breath and chest pain  EXAM: CHEST  2 VIEW  COMPARISON:  None.  FINDINGS: Cardiac shadow is within normal limits. There is fullness in the right peritracheal region and right hilar region identified. Right basilar atelectasis is noted. The left lung is well aerated. No acute bony abnormality is seen.  IMPRESSION: Changes consistent with increased lymphadenopathy/possible mass in the right peritracheal  region and right hilar region. Further evaluation by means of CT of the chest with contrast is recommended.  These results were called by telephone at the time of interpretation on 06/01/2014 at 3:50 pm to Dr. Rogene Houston, who verbally acknowledged these results.   Electronically Signed   By: Inez Catalina M.D.   On: 06/01/2014 15:53   Ct Chest W Contrast  06/01/2014   CLINICAL DATA:  Cough and shortness of breath. Lump on the left near the last rib.  EXAM: CT CHEST WITH CONTRAST   TECHNIQUE: Multidetector CT imaging of the chest was performed during intravenous contrast administration.  CONTRAST:  80 mL OMNIPAQUE IOHEXOL 300 MG/ML  SOLN  COMPARISON:  PA and lateral chest earlier this same day.  FINDINGS: A necrotic lymph node in the right pretracheal station on image 16 measures 3.1 x 3.1 cm. There is a large right hilar mass encasing the right mainstem bronchus and right main pulmonary artery. The mass measures 7.2 x 7.5 cm on image 28. The lesion narrows the right mainstem bronchus and appears to occlude it distally. The lesion also narrows the right main pulmonary artery. No embolus is seen.  There is no pleural or pericardial effusion.  Heart size is normal.  The lungs demonstrate emphysematous disease. There is volume loss in the right chest secondary to atelectasis related to right mainstem bronchus occlusion. Atelectatic change is seen in the right base. There is a right lower lobe pulmonary nodule on image 39 measures 0.7 cm in diameter. A subpleural nodule in the periphery of the right middle lobe measures 0.6 cm in diameter. A nodule along the left major fissure on image 32 measures 0.4 cm. A nodule in the lingula also on image 32 measures 0.4 cm.  Visualized abdomen shows a possible punctate nonobstructing stone in the upper pole the right kidney. The adrenal glands, spleen, pancreas, gallbladder and biliary tree appear normal. No liver lesion is identified. No focal bony abnormality is seen.  IMPRESSION: Large mass centered in the right hilum consistent with bronchogenic carcinoma. As above, the lesion occludes the distal right mainstem bronchus resulting in volume loss in the right chest and markedly narrows the right main pulmonary artery. Associated metastatic pretracheal lymphadenopathy and nodules in both lungs are as described above.   Electronically Signed   By: Inge Rise M.D.   On: 06/01/2014 20:53   Mr Jeri Cos ZJ Contrast  06/18/2014   CLINICAL DATA:  Lung  cancer staging  EXAM: MRI HEAD WITHOUT AND WITH CONTRAST  TECHNIQUE: Multiplanar, multiecho pulse sequences of the brain and surrounding structures were obtained without and with intravenous contrast.  CONTRAST:  95mL MULTIHANCE GADOBENATE DIMEGLUMINE 529 MG/ML IV SOLN  COMPARISON:  None.  FINDINGS: Multiple enhancing lesions are present consistent with metastatic disease.  5 mm lesion right lower cerebellum.  7 mm right parietal enhancing lesion. Small amount of hemorrhage associated with this lesion.  6 mm enhancing lesion over the frontal parietal convexity  2 x 3 mm left parietal cortical lesion over the convexity.  Minimal edema.  No mass effect or midline shift.  Ventricle size is normal.  No acute infarct.  IMPRESSION: Four metastatic deposits in the brain. Right parietal lesion shows mild hemorrhage.   Electronically Signed   By: Franchot Gallo M.D.   On: 06/18/2014 17:17   Nm Pet Image Initial (pi) Skull Base To Thigh  06/18/2014   CLINICAL DATA:  Initial treatment strategy for lung mass.  EXAM: NUCLEAR MEDICINE PET  SKULL BASE TO THIGH  TECHNIQUE: 6.8 mCi F-18 FDG was injected intravenously. Full-ring PET imaging was performed from the skull base to thigh after the radiotracer. CT data was obtained and used for attenuation correction and anatomic localization.  FASTING BLOOD GLUCOSE:  Value: 96 mg/dl  COMPARISON:  Chest CT 06/01/2014.  FINDINGS: NECK  No hypermetabolic lymph nodes in the neck.  CHEST  Again noted is a large mass centered in the right hilar region, with direct invasion of the mediastinum, measuring approximately 7.3 x 8.0 cm (SUVmax = 14.4). This mass completely encases the distal right mainstem bronchus and in the right upper lobe bronchus, which is markedly narrowed and nearly completely occluded. The right upper lobe remains patent at this time. The mass completely encases the bronchus intermedius, right middle lobe bronchi and right lower lobe bronchi, which appear completely occluded  at this time, although portions of the right middle and lower lobes remain aerated. There are extensive postobstructive changes in the right lung, most severe in the right middle and lower lobes, predominantly postobstructive pneumonitis. However, some of these areas are somewhat nodular in appearance and demonstrate low levels of hypermetabolism such that lymphangitic spread of tumor throughout these areas of the lung is difficult to entirely exclude. Bulky high right paratracheal lymphadenopathy is noted measuring up to 3.9 x 3.3 cm (SUVmax = 10.6). The left lung as well aerated. No pleural effusions. Mild cardiomegaly. There is atherosclerosis of the thoracic aorta, the great vessels of the mediastinum and the coronary arteries, including calcified atherosclerotic plaque in the left main, left anterior descending and left circumflex coronary arteries.  ABDOMEN/PELVIS  In the subcutaneous soft tissues of the left flank there is a 2.4 x 2.1 cm hypermetabolic (SUVmax = 9.1) soft tissue attenuation nodule, presumably a soft tissue metastasis. No abnormal hypermetabolic activity within the liver, pancreas, adrenal glands, or spleen. No hypermetabolic lymph nodes in the abdomen or pelvis. Well-defined 2.8 cm low-attenuation lesion in the upper pole of the left kidney was previously characterized as a simple cyst. Extensive atherosclerosis throughout the abdominal and pelvic vasculature, without definite aneurysm on today's noncontrast CT examination.  SKELETON  No destructive hypermetabolic skeletal lesions to suggest osseous metastasis at this time.  IMPRESSION: 1. 7.3 x 8.0 cm right lung mass in the right hilar region with direct mediastinal invasion, potential lymphangitic spread of tumor in the base of the right lung, high right peritracheal lymphadenopathy, and metastatic soft tissue implant in the subcutaneous fat of the left flank. Findings are most compatible with stage IV lung cancer (T4, N2, M1b). 2.  Atherosclerosis, including left main and 3 vessel coronary artery disease. 3. Additional incidental findings, as above.   Electronically Signed   By: Vinnie Langton M.D.   On: 06/18/2014 15:32    Labs:  CBC:  Recent Labs  06/01/14 1614 06/09/14 1133  WBC 4.6 8.8  HGB 13.2 11.8  HCT 38.9 34.8  PLT 249 257    COAGS: No results for input(s): INR, APTT in the last 8760 hours.  BMP:  Recent Labs  06/01/14 1614 06/09/14 1133  NA 144 140  K 3.9 3.8  CL 106  --   CO2 25 27  GLUCOSE 128* 110  BUN 5* 6.7*  CALCIUM 9.8 10.0  CREATININE 0.55 0.7  GFRNONAA >90  --   GFRAA >90  --     LIVER FUNCTION TESTS:  Recent Labs  06/09/14 1133  BILITOT 0.47  AST 49*  ALT 106*  ALKPHOS 104  PROT 7.3  ALBUMIN 2.8*  06/30/14 LABS PENDING  TUMOR MARKERS: No results for input(s): AFPTM, CEA, CA199, CHROMGRNA in the last 8760 hours.  Assessment and Plan: Mariah Park is a 58 y.o. 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 She presents today for US guided biopsy of the left flank/abd wall ST mass for confirmation. Details/risks of procedure d/w pt with her understanding and consent.            Signed: Autumn Messing 06/30/2014, 12:35 PM

## 2014-06-30 NOTE — Procedures (Signed)
[place] core bx Left flank SQ mass 18g x3 to surg path No complication No blood loss. See complete dictation in [facility].

## 2014-06-30 NOTE — Sedation Documentation (Signed)
Pt does not have anyone to accompany her home s/p procedure. Pt to discuss this MD option of doing the procedure without sedation.

## 2014-07-01 ENCOUNTER — Telehealth: Payer: Self-pay | Admitting: *Deleted

## 2014-07-01 ENCOUNTER — Ambulatory Visit: Payer: Self-pay | Admitting: Internal Medicine

## 2014-07-01 ENCOUNTER — Other Ambulatory Visit: Payer: Self-pay | Admitting: *Deleted

## 2014-07-01 DIAGNOSIS — R918 Other nonspecific abnormal finding of lung field: Secondary | ICD-10-CM

## 2014-07-01 NOTE — Telephone Encounter (Signed)
Called patient to follow up on appt.  She stated she forgot about appt today.  I re-scheduled for tomorrow 10:45 labs and 11:00 with Dr. Julien Nordmann.  She verbalized understanding of appt time and place.

## 2014-07-02 ENCOUNTER — Encounter: Payer: Self-pay | Admitting: *Deleted

## 2014-07-02 ENCOUNTER — Other Ambulatory Visit: Payer: Self-pay | Admitting: Medical Oncology

## 2014-07-02 ENCOUNTER — Ambulatory Visit (HOSPITAL_BASED_OUTPATIENT_CLINIC_OR_DEPARTMENT_OTHER): Payer: Self-pay | Admitting: Internal Medicine

## 2014-07-02 ENCOUNTER — Ambulatory Visit (HOSPITAL_BASED_OUTPATIENT_CLINIC_OR_DEPARTMENT_OTHER): Payer: Self-pay

## 2014-07-02 ENCOUNTER — Encounter: Payer: Self-pay | Admitting: Internal Medicine

## 2014-07-02 VITALS — BP 149/83 | HR 84 | Temp 98.9°F | Resp 20 | Ht 64.0 in | Wt 152.8 lb

## 2014-07-02 DIAGNOSIS — C3491 Malignant neoplasm of unspecified part of right bronchus or lung: Secondary | ICD-10-CM

## 2014-07-02 DIAGNOSIS — C7931 Secondary malignant neoplasm of brain: Secondary | ICD-10-CM

## 2014-07-02 DIAGNOSIS — C3490 Malignant neoplasm of unspecified part of unspecified bronchus or lung: Secondary | ICD-10-CM | POA: Insufficient documentation

## 2014-07-02 DIAGNOSIS — R918 Other nonspecific abnormal finding of lung field: Secondary | ICD-10-CM

## 2014-07-02 DIAGNOSIS — C781 Secondary malignant neoplasm of mediastinum: Secondary | ICD-10-CM

## 2014-07-02 LAB — COMPREHENSIVE METABOLIC PANEL (CC13)
ALT: 33 U/L (ref 0–55)
ANION GAP: 9 meq/L (ref 3–11)
AST: 11 U/L (ref 5–34)
Albumin: 2.9 g/dL — ABNORMAL LOW (ref 3.5–5.0)
Alkaline Phosphatase: 80 U/L (ref 40–150)
BUN: 12 mg/dL (ref 7.0–26.0)
CALCIUM: 8.8 mg/dL (ref 8.4–10.4)
CHLORIDE: 108 meq/L (ref 98–109)
CO2: 25 meq/L (ref 22–29)
Creatinine: 0.7 mg/dL (ref 0.6–1.1)
GLUCOSE: 127 mg/dL (ref 70–140)
POTASSIUM: 4.5 meq/L (ref 3.5–5.1)
Sodium: 141 mEq/L (ref 136–145)
TOTAL PROTEIN: 5.9 g/dL — AB (ref 6.4–8.3)
Total Bilirubin: <0.2 mg/dL (ref 0.20–1.20)

## 2014-07-02 LAB — TECHNOLOGIST REVIEW

## 2014-07-02 LAB — CBC WITH DIFFERENTIAL/PLATELET
BASO%: 0.1 % (ref 0.0–2.0)
BASOS ABS: 0 10*3/uL (ref 0.0–0.1)
EOS ABS: 0 10*3/uL (ref 0.0–0.5)
EOS%: 0 % (ref 0.0–7.0)
HCT: 36.2 % (ref 34.8–46.6)
HEMOGLOBIN: 11.8 g/dL (ref 11.6–15.9)
LYMPH%: 11 % — ABNORMAL LOW (ref 14.0–49.7)
MCH: 30.2 pg (ref 25.1–34.0)
MCHC: 32.6 g/dL (ref 31.5–36.0)
MCV: 92.6 fL (ref 79.5–101.0)
MONO#: 0.9 10*3/uL (ref 0.1–0.9)
MONO%: 5.1 % (ref 0.0–14.0)
NEUT%: 83.8 % — ABNORMAL HIGH (ref 38.4–76.8)
NEUTROS ABS: 15.4 10*3/uL — AB (ref 1.5–6.5)
PLATELETS: 300 10*3/uL (ref 145–400)
RBC: 3.91 10*6/uL (ref 3.70–5.45)
RDW: 16.5 % — AB (ref 11.2–14.5)
WBC: 18.3 10*3/uL — AB (ref 3.9–10.3)
lymph#: 2 10*3/uL (ref 0.9–3.3)

## 2014-07-02 NOTE — Progress Notes (Signed)
Spoke with patient today at [facility].  She came to follow up appt to see Dr. Julien Nordmann.  He explained her disease and plan of care.  I then spoke to her about lung cancer, staging, chemotherapy agents and resources at [facility].  I answered all questions.  She stated she will not have any more trouble getting to and from appt's, her son is coming in town.  I will follow up with Social Worker and let her know her plan of care and that she has transportation.

## 2014-07-02 NOTE — Progress Notes (Signed)
[facility] [phone]   [phone]) [phone]  OFFICE PROGRESS NOTE  No PCP Per Patient No address on file  DIAGNOSIS: Extensive stage small cell lung cancer diagnosed in November 2015 presented with large right hilar mass with direct mediastinal invasion in addition to mediastinal lymphadenopathy and metastatic lesion in the subcutaneous fat of the left flank.  PRIOR THERAPY: None  CURRENT THERAPY: Systemic chemotherapy with carboplatin for AUC of 5 on day 1 and etoposide 120 MG/M2 on days 1, 2 and 3 with Neulasta support on day 4. First cycle on 07/05/2014.  INTERVAL HISTORY: Mariah Park 58 y.o. female returns to the clinic today for follow-up visit. The patient recently underwent ultrasound-guided core biopsy of the left subcutaneous nodule by interventional radiology. The final pathology was consistent with small cell carcinoma. She is here today for evaluation and recommendation regarding treatment of her condition. The patient is started becoming symptomatic with increasing retrosternal chest pain as well as shortness of breath with exertion in addition to mild cough with no hemoptysis. The patient denied having any fever or chills, no nausea or vomiting. She denied having any significant weight loss or night sweats.   MEDICAL HISTORY: Past Medical History  Diagnosis Date  . Brain metastases 06/21/2014    ALLERGIES:  has No Known Allergies.  MEDICATIONS:  Current Outpatient Prescriptions  Medication Sig Dispense Refill  . dexamethasone (DECADRON) 4 MG tablet Take 1 tablet (4 mg total) by mouth 2 (two) times daily. 30 tablet 0  . Multiple Vitamins-Minerals (MULTIVITAMIN WITH MINERALS) tablet Take 1 tablet by mouth daily.    Marland Kitchen oxyCODONE-acetaminophen (PERCOCET) 5-325 MG per tablet Take 1-2 tablets by mouth every 4 (four) hours as needed. (Patient not taking: Reported on 06/29/2014) 20 tablet 0   No current facility-administered medications for  this visit.    SURGICAL HISTORY: No past surgical history on file.  REVIEW OF SYSTEMS:  Constitutional: negative Eyes: negative Ears, nose, mouth, throat, and face: negative Respiratory: positive for cough, dyspnea on exertion and pleurisy/chest pain Cardiovascular: negative Gastrointestinal: negative Genitourinary:negative Integument/breast: negative Hematologic/lymphatic: negative Musculoskeletal:negative Neurological: negative Behavioral/Psych: negative Endocrine: negative Allergic/Immunologic: negative   PHYSICAL EXAMINATION: General appearance: alert, cooperative and no distress Head: Normocephalic, without obvious abnormality, atraumatic Neck: no adenopathy, no JVD, supple, symmetrical, trachea midline and thyroid not enlarged, symmetric, no tenderness/mass/nodules Lymph nodes: Cervical, supraclavicular, and axillary nodes normal. Resp: clear to auscultation bilaterally Back: symmetric, no curvature. ROM normal. No CVA tenderness. Cardio: regular rate and rhythm, S1, S2 normal, no murmur, click, rub or gallop GI: soft, non-tender; bowel sounds normal; no masses,  no organomegaly Extremities: extremities normal, atraumatic, no cyanosis or edema Neurologic: Alert and oriented X 3, normal strength and tone. Normal symmetric reflexes. Normal coordination and gait  ECOG PERFORMANCE STATUS: 1 - Symptomatic but completely ambulatory  There were no vitals taken for this visit.  LABORATORY DATA: Lab Results  Component Value Date   WBC 18.3* 07/02/2014   HGB 11.8 07/02/2014   HCT 36.2 07/02/2014   MCV 92.6 07/02/2014   PLT 300 07/02/2014      Chemistry      Component Value Date/Time   NA 140 06/09/2014 1133   NA 144 06/01/2014 1614   K 3.8 06/09/2014 1133   K 3.9 06/01/2014 1614   CL 106 06/01/2014 1614   CO2 27 06/09/2014 1133   CO2 25 06/01/2014 1614   BUN 6.7* 06/09/2014 1133   BUN 5* 06/01/2014 1614  CREATININE 0.7 06/09/2014 1133   CREATININE 0.55  06/01/2014 1614      Component Value Date/Time   CALCIUM 10.0 06/09/2014 1133   CALCIUM 9.8 06/01/2014 1614   ALKPHOS 104 06/09/2014 1133   AST 49* 06/09/2014 1133   ALT 106* 06/09/2014 1133   BILITOT 0.47 06/09/2014 1133       RADIOGRAPHIC STUDIES: Mr Jeri Cos [place] Contrast  2014-06-24   CLINICAL DATA:  Lung cancer staging  EXAM: MRI HEAD WITHOUT AND WITH CONTRAST  TECHNIQUE: Multiplanar, multiecho pulse sequences of the brain and surrounding structures were obtained without and with intravenous contrast.  CONTRAST:  69mL MULTIHANCE GADOBENATE DIMEGLUMINE 529 MG/ML IV SOLN  COMPARISON:  None.  FINDINGS: Multiple enhancing lesions are present consistent with metastatic disease.  5 mm lesion right lower cerebellum.  7 mm right parietal enhancing lesion. Small amount of hemorrhage associated with this lesion.  6 mm enhancing lesion over the frontal parietal convexity  2 x 3 mm left parietal cortical lesion over the convexity.  Minimal edema.  No mass effect or midline shift.  Ventricle size is normal.  No acute infarct.  IMPRESSION: Four metastatic deposits in the brain. Right parietal lesion shows mild hemorrhage.   Electronically Signed   By: Franchot Gallo M.D.   On: 24-Jun-2014 17:17   Nm Pet Image Initial (pi) Skull Base To Thigh  06-24-2014   CLINICAL DATA:  Initial treatment strategy for lung mass.  EXAM: NUCLEAR MEDICINE PET SKULL BASE TO THIGH  TECHNIQUE: 6.8 mCi F-18 FDG was injected intravenously. Full-ring PET imaging was performed from the skull base to thigh after the radiotracer. CT data was obtained and used for attenuation correction and anatomic localization.  FASTING BLOOD GLUCOSE:  Value: 96 mg/dl  COMPARISON:  Chest CT 06/01/2014.  FINDINGS: NECK  No hypermetabolic lymph nodes in the neck.  CHEST  Again noted is a large mass centered in the right hilar region, with direct invasion of the mediastinum, measuring approximately 7.3 x 8.0 cm (SUVmax = 14.4). This mass completely encases  the distal right mainstem bronchus and in the right upper lobe bronchus, which is markedly narrowed and nearly completely occluded. The right upper lobe remains patent at this time. The mass completely encases the bronchus intermedius, right middle lobe bronchi and right lower lobe bronchi, which appear completely occluded at this time, although portions of the right middle and lower lobes remain aerated. There are extensive postobstructive changes in the right lung, most severe in the right middle and lower lobes, predominantly postobstructive pneumonitis. However, some of these areas are somewhat nodular in appearance and demonstrate low levels of hypermetabolism such that lymphangitic spread of tumor throughout these areas of the lung is difficult to entirely exclude. Bulky high right paratracheal lymphadenopathy is noted measuring up to 3.9 x 3.3 cm (SUVmax = 10.6). The left lung as well aerated. No pleural effusions. Mild cardiomegaly. There is atherosclerosis of the thoracic aorta, the great vessels of the mediastinum and the coronary arteries, including calcified atherosclerotic plaque in the left main, left anterior descending and left circumflex coronary arteries.  ABDOMEN/PELVIS  In the subcutaneous soft tissues of the left flank there is a 2.4 x 2.1 cm hypermetabolic (SUVmax = 9.1) soft tissue attenuation nodule, presumably a soft tissue metastasis. No abnormal hypermetabolic activity within the liver, pancreas, adrenal glands, or spleen. No hypermetabolic lymph nodes in the abdomen or pelvis. Well-defined 2.8 cm low-attenuation lesion in the upper pole of the left kidney was previously characterized as  a simple cyst. Extensive atherosclerosis throughout the abdominal and pelvic vasculature, without definite aneurysm on today's noncontrast CT examination.  SKELETON  No destructive hypermetabolic skeletal lesions to suggest osseous metastasis at this time.  IMPRESSION: 1. 7.3 x 8.0 cm right lung mass in  the right hilar region with direct mediastinal invasion, potential lymphangitic spread of tumor in the base of the right lung, high right peritracheal lymphadenopathy, and metastatic soft tissue implant in the subcutaneous fat of the left flank. Findings are most compatible with stage IV lung cancer (T4, N2, M1b). 2. Atherosclerosis, including left main and 3 vessel coronary artery disease. 3. Additional incidental findings, as above.   Electronically Signed   By: Vinnie Langton M.D.   On: 06/18/2014 15:32   US Biopsy  06/30/2014   CLINICAL DATA:  Hypermetabolic right lung mass. Hypermetabolic nodule in the subcutaneous tissues of the left lateral flank.  EXAM: ULTRASOUND GUIDED CORE BIOPSY OF LEFT SUBCUTANEOUS NODULE  MEDICATIONS: Lidocaine 1% subcutaneous 10 mL  PROCEDURE: The procedure, risks, benefits, and alternatives were explained to the patient. Questions regarding the procedure were encouraged and answered. The patient understands and consents to the procedure.  Survey ultrasound of the left flank region was performed. A 3cm hypoechoic solid mass was localized corresponding to the hypermetabolic lesion on previous PET-CT. An appropriate skin entry site was determined and marked.  The operative field was prepped with Betadine in a sterile fashion, and a sterile drape was applied covering the operative field. A sterile gown and sterile gloves were used for the procedure. Local anesthesia was provided with 1% Lidocaine.  Under real-time ultrasound guidance, a 17 gauge trocar needle was advanced to the margin of the lesion. Once needle tip position was confirmed, coaxial 18-gauge core biopsy samples were obtained, submitted in formalin to surgical pathology. The guide needle was removed. Postprocedure scans reveal no hematoma or other apparent complication.  COMPLICATIONS: None.  FINDINGS: 3 cm subcutaneous left flank mass localize corresponding to hypermetabolic lesion on PET-CT. Ultrasound-guided core  biopsy performed.  IMPRESSION: 1. Technically successful ultrasound-guided core biopsy of left flank subcutaneous mass.   Electronically Signed   By: Arne Cleveland M.D.   On: 06/30/2014 16:16    ASSESSMENT AND PLAN: This is a very pleasant 58 years old African-American female with:  1) Extensive stage small cell lung cancer presented with a right lung mass in the right hilar area with direct mediastinal invasion as well as metastatic soft tissue implant in the subcutaneous fat end of the left flank area and few metastatic brain lesions. I had a lengthy discussion with the patient today about her current disease stage, prognosis and treatment options. I recommended for the patient systemic chemotherapy with carboplatin for AUC of 5 on day 1 and etoposide 120 MG/M2 on days 1, 2 and 3 with Neulasta support on day 4. I discussed with the patient adverse effect of the chemotherapy including but not limited to alopecia, myelosuppression, nausea and vomiting, peripheral neuropathy, liver or renal dysfunction. She is currently symptomatic and I arranged for the patient to start her treatment as soon as possible. She is expected to start the first cycle of this treatment on 07/05/2014. The patient would have a chemotherapy education class before starting the first dose of her chemotherapy. She was also given the option of palliative care and hospice referral but the patient would like to proceed with chemotherapy as planned. I will call her pharmacy was prescription for Compazine 10 mg by mouth every 6  hours as needed for nausea.  2) metastatic brain lesions: I started the patient on Decadron 4 mg by mouth twice a day for the metastatic brain lesions. She was evaluated by Dr. Sondra Come and she would be considered for treatment in the future after control of her systemic disease. She would come back for follow-up visit in 2 weeks for reevaluation and management of any adverse effect of her treatment. The patient  was advised to call immediately if she has any concerning symptoms in the interval. The patient voices understanding of current disease status and treatment options and is in agreement with the current care plan.  All questions were answered. The patient knows to call the clinic with any problems, questions or concerns. We can certainly see the patient much sooner if necessary.  I spent 15 minutes counseling the patient face to face. The total time spent in the appointment was 25 minutes.  Disclaimer: This note was dictated with voice recognition software. Similar sounding words can inadvertently be transcribed and may not be corrected upon review.

## 2014-07-03 MED ORDER — PROCHLORPERAZINE MALEATE 10 MG PO TABS: 10.0000 mg | ORAL_TABLET | Freq: Four times a day (QID) | ORAL | Status: DC | PRN

## 2014-07-05 ENCOUNTER — Telehealth: Payer: Self-pay | Admitting: Internal Medicine

## 2014-07-05 ENCOUNTER — Ambulatory Visit (HOSPITAL_BASED_OUTPATIENT_CLINIC_OR_DEPARTMENT_OTHER): Payer: Self-pay

## 2014-07-05 ENCOUNTER — Other Ambulatory Visit: Payer: Self-pay | Admitting: Internal Medicine

## 2014-07-05 ENCOUNTER — Other Ambulatory Visit: Payer: Self-pay

## 2014-07-05 DIAGNOSIS — C7931 Secondary malignant neoplasm of brain: Secondary | ICD-10-CM

## 2014-07-05 DIAGNOSIS — Z5111 Encounter for antineoplastic chemotherapy: Secondary | ICD-10-CM

## 2014-07-05 DIAGNOSIS — C3491 Malignant neoplasm of unspecified part of right bronchus or lung: Secondary | ICD-10-CM

## 2014-07-05 MED ORDER — ONDANSETRON 16 MG/50ML IVPB (CHCC)
INTRAVENOUS | Status: AC
  Filled 2014-07-05: qty 16

## 2014-07-05 MED ORDER — SODIUM CHLORIDE 0.9 % IV SOLN
Freq: Once | INTRAVENOUS | Status: AC
  Administered 2014-07-05: 12:00:00 via INTRAVENOUS

## 2014-07-05 MED ORDER — ONDANSETRON 16 MG/50ML IVPB (CHCC)
16.0000 mg | Freq: Once | INTRAVENOUS | Status: AC
  Administered 2014-07-05: 16 mg via INTRAVENOUS

## 2014-07-05 MED ORDER — DEXAMETHASONE SODIUM PHOSPHATE 20 MG/5ML IJ SOLN
20.0000 mg | Freq: Once | INTRAMUSCULAR | Status: AC
  Administered 2014-07-05: 20 mg via INTRAVENOUS

## 2014-07-05 MED ORDER — SODIUM CHLORIDE 0.9 % IV SOLN
120.0000 mg/m2 | Freq: Once | INTRAVENOUS | Status: AC
  Administered 2014-07-05: 210 mg via INTRAVENOUS
  Filled 2014-07-05: qty 10.5

## 2014-07-05 MED ORDER — SODIUM CHLORIDE 0.9 % IV SOLN
554.5000 mg | Freq: Once | INTRAVENOUS | Status: AC
  Administered 2014-07-05: 550 mg via INTRAVENOUS
  Filled 2014-07-05: qty 55

## 2014-07-05 MED ORDER — DEXAMETHASONE SODIUM PHOSPHATE 20 MG/5ML IJ SOLN
INTRAMUSCULAR | Status: AC
  Filled 2014-07-05: qty 5

## 2014-07-05 NOTE — Telephone Encounter (Signed)
Pt confirmed labs/ov/chemo class sent msg to [facility] to add on 11/23 per MD on 11/20 per 11/20 POF, also sent chemo has been added and sent msg on 11/23 to give pt updated sch in chemo room..... KJ

## 2014-07-05 NOTE — Patient Instructions (Signed)
[facility] Discharge Instructions for Patients Receiving Chemotherapy  Today you received the following chemotherapy agents carboplatin/etoposide.    To help prevent nausea and vomiting after your treatment, we encourage you to take your nausea medication as directed   If you develop nausea and vomiting that is not controlled by your nausea medication, call the clinic.   BELOW ARE SYMPTOMS THAT SHOULD BE REPORTED IMMEDIATELY:  *FEVER GREATER THAN 100.5 F  *CHILLS WITH OR WITHOUT FEVER  NAUSEA AND VOMITING THAT IS NOT CONTROLLED WITH YOUR NAUSEA MEDICATION  *UNUSUAL SHORTNESS OF BREATH  *UNUSUAL BRUISING OR BLEEDING  TENDERNESS IN MOUTH AND THROAT WITH OR WITHOUT PRESENCE OF ULCERS  *URINARY PROBLEMS  *BOWEL PROBLEMS  UNUSUAL RASH Items with * indicate a potential emergency and should be followed up as soon as possible.  Feel free to call the clinic you have any questions or concerns. The clinic phone number is [phone].  Carboplatin injection What is this medicine? CARBOPLATIN (KAR boe pla tin) is a chemotherapy drug. It targets fast dividing cells, like cancer cells, and causes these cells to die. This medicine is used to treat ovarian cancer and many other cancers. This medicine may be used for other purposes; ask your health care provider or pharmacist if you have questions. COMMON BRAND NAME(S): Paraplatin What should I tell my health care provider before I take this medicine? They need to know if you have any of these conditions: -blood disorders -hearing problems -kidney disease -recent or ongoing radiation therapy -an unusual or allergic reaction to carboplatin, cisplatin, other chemotherapy, other medicines, foods, dyes, or preservatives -pregnant or trying to get pregnant -breast-feeding How should I use this medicine? This drug is usually given as an infusion into a vein. It is administered in a hospital or clinic by a specially trained  health care professional. Talk to your pediatrician regarding the use of this medicine in children. Special care may be needed. Overdosage: If you think you have taken too much of this medicine contact a poison control center or emergency room at once. NOTE: This medicine is only for you. Do not share this medicine with others. What if I miss a dose? It is important not to miss a dose. Call your doctor or health care professional if you are unable to keep an appointment. What may interact with this medicine? -medicines for seizures -medicines to increase blood counts like filgrastim, pegfilgrastim, sargramostim -some antibiotics like amikacin, gentamicin, neomycin, streptomycin, tobramycin -vaccines Talk to your doctor or health care professional before taking any of these medicines: -acetaminophen -aspirin -ibuprofen -ketoprofen -naproxen This list may not describe all possible interactions. Give your health care provider a list of all the medicines, herbs, non-prescription drugs, or dietary supplements you use. Also tell them if you smoke, drink alcohol, or use illegal drugs. Some items may interact with your medicine. What should I watch for while using this medicine? Your condition will be monitored carefully while you are receiving this medicine. You will need important blood work done while you are taking this medicine. This drug may make you feel generally unwell. This is not uncommon, as chemotherapy can affect healthy cells as well as cancer cells. Report any side effects. Continue your course of treatment even though you feel ill unless your doctor tells you to stop. In some cases, you may be given additional medicines to help with side effects. Follow all directions for their use. Call your doctor or health care professional for advice if you  get a fever, chills or sore throat, or other symptoms of a cold or flu. Do not treat yourself. This drug decreases your body's ability to fight  infections. Try to avoid being around people who are sick. This medicine may increase your risk to bruise or bleed. Call your doctor or health care professional if you notice any unusual bleeding. Be careful brushing and flossing your teeth or using a toothpick because you may get an infection or bleed more easily. If you have any dental work done, tell your dentist you are receiving this medicine. Avoid taking products that contain aspirin, acetaminophen, ibuprofen, naproxen, or ketoprofen unless instructed by your doctor. These medicines may hide a fever. Do not become pregnant while taking this medicine. Women should inform their doctor if they wish to become pregnant or think they might be pregnant. There is a potential for serious side effects to an unborn child. Talk to your health care professional or pharmacist for more information. Do not breast-feed an infant while taking this medicine. What side effects may I notice from receiving this medicine? Side effects that you should report to your doctor or health care professional as soon as possible: -allergic reactions like skin rash, itching or hives, swelling of the face, lips, or tongue -signs of infection - fever or chills, cough, sore throat, pain or difficulty passing urine -signs of decreased platelets or bleeding - bruising, pinpoint red spots on the skin, black, tarry stools, nosebleeds -signs of decreased red blood cells - unusually weak or tired, fainting spells, lightheadedness -breathing problems -changes in hearing -changes in vision -chest pain -high blood pressure -low blood counts - This drug may decrease the number of white blood cells, red blood cells and platelets. You may be at increased risk for infections and bleeding. -nausea and vomiting -pain, swelling, redness or irritation at the injection site -pain, tingling, numbness in the hands or feet -problems with balance, talking, walking -trouble passing urine or change  in the amount of urine Side effects that usually do not require medical attention (report to your doctor or health care professional if they continue or are bothersome): -hair loss -loss of appetite -metallic taste in the mouth or changes in taste This list may not describe all possible side effects. Call your doctor for medical advice about side effects. You may report side effects to FDA at 1-800-FDA-1088. Where should I keep my medicine? This drug is given in a hospital or clinic and will not be stored at home. NOTE: This sheet is a summary. It may not cover all possible information. If you have questions about this medicine, talk to your doctor, pharmacist, or health care provider.  2015, Elsevier/Gold Standard. (2007-11-04 14:38:05)   Etoposide, VP-16 injection What is this medicine? ETOPOSIDE, VP-16 (e toe POE side) is a chemotherapy drug. It is used to treat testicular cancer, lung cancer, and other cancers. This medicine may be used for other purposes; ask your health care provider or pharmacist if you have questions. COMMON BRAND NAME(S): Etopophos, Toposar, VePesid What should I tell my health care provider before I take this medicine? They need to know if you have any of these conditions: -infection -kidney disease -low blood counts, like low white cell, platelet, or red cell counts -an unusual or allergic reaction to etoposide, other chemotherapeutic agents, other medicines, foods, dyes, or preservatives -pregnant or trying to get pregnant -breast-feeding How should I use this medicine? This medicine is for infusion into a vein. It is administered in  a hospital or clinic by a specially trained health care professional. Talk to your pediatrician regarding the use of this medicine in children. Special care may be needed. Overdosage: If you think you have taken too much of this medicine contact a poison control center or emergency room at once. NOTE: This medicine is only for  you. Do not share this medicine with others. What if I miss a dose? It is important not to miss your dose. Call your doctor or health care professional if you are unable to keep an appointment. What may interact with this medicine? -cyclosporine -medicines to increase blood counts like filgrastim, pegfilgrastim, sargramostim -vaccines This list may not describe all possible interactions. Give your health care provider a list of all the medicines, herbs, non-prescription drugs, or dietary supplements you use. Also tell them if you smoke, drink alcohol, or use illegal drugs. Some items may interact with your medicine. What should I watch for while using this medicine? Visit your doctor for checks on your progress. This drug may make you feel generally unwell. This is not uncommon, as chemotherapy can affect healthy cells as well as cancer cells. Report any side effects. Continue your course of treatment even though you feel ill unless your doctor tells you to stop. In some cases, you may be given additional medicines to help with side effects. Follow all directions for their use. Call your doctor or health care professional for advice if you get a fever, chills or sore throat, or other symptoms of a cold or flu. Do not treat yourself. This drug decreases your body's ability to fight infections. Try to avoid being around people who are sick. This medicine may increase your risk to bruise or bleed. Call your doctor or health care professional if you notice any unusual bleeding. Be careful brushing and flossing your teeth or using a toothpick because you may get an infection or bleed more easily. If you have any dental work done, tell your dentist you are receiving this medicine. Avoid taking products that contain aspirin, acetaminophen, ibuprofen, naproxen, or ketoprofen unless instructed by your doctor. These medicines may hide a fever. Do not become pregnant while taking this medicine. Women should  inform their doctor if they wish to become pregnant or think they might be pregnant. There is a potential for serious side effects to an unborn child. Talk to your health care professional or pharmacist for more information. Do not breast-feed an infant while taking this medicine. What side effects may I notice from receiving this medicine? Side effects that you should report to your doctor or health care professional as soon as possible: -allergic reactions like skin rash, itching or hives, swelling of the face, lips, or tongue -low blood counts - this medicine may decrease the number of white blood cells, red blood cells and platelets. You may be at increased risk for infections and bleeding. -signs of infection - fever or chills, cough, sore throat, pain or difficulty passing urine -signs of decreased platelets or bleeding - bruising, pinpoint red spots on the skin, black, tarry stools, blood in the urine -signs of decreased red blood cells - unusually weak or tired, fainting spells, lightheadedness -breathing problems -changes in vision -mouth or throat sores or ulcers -pain, redness, swelling or irritation at the injection site -pain, tingling, numbness in the hands or feet -redness, blistering, peeling or loosening of the skin, including inside the mouth -seizures -vomiting Side effects that usually do not require medical attention (report to your  doctor or health care professional if they continue or are bothersome): -diarrhea -hair loss -loss of appetite -nausea -stomach pain This list may not describe all possible side effects. Call your doctor for medical advice about side effects. You may report side effects to FDA at 1-800-FDA-1088. Where should I keep my medicine? This drug is given in a hospital or clinic and will not be stored at home. NOTE: This sheet is a summary. It may not cover all possible information. If you have questions about this medicine, talk to your doctor,  pharmacist, or health care provider.  2015, Elsevier/Gold Standard. (2007-12-01 17:24:12)

## 2014-07-06 ENCOUNTER — Encounter: Payer: Self-pay | Admitting: *Deleted

## 2014-07-06 ENCOUNTER — Ambulatory Visit (HOSPITAL_BASED_OUTPATIENT_CLINIC_OR_DEPARTMENT_OTHER): Payer: Self-pay

## 2014-07-06 ENCOUNTER — Encounter: Payer: Self-pay | Admitting: Internal Medicine

## 2014-07-06 DIAGNOSIS — C3491 Malignant neoplasm of unspecified part of right bronchus or lung: Secondary | ICD-10-CM

## 2014-07-06 DIAGNOSIS — C3411 Malignant neoplasm of upper lobe, right bronchus or lung: Secondary | ICD-10-CM

## 2014-07-06 DIAGNOSIS — Z5111 Encounter for antineoplastic chemotherapy: Secondary | ICD-10-CM

## 2014-07-06 DIAGNOSIS — C7931 Secondary malignant neoplasm of brain: Secondary | ICD-10-CM

## 2014-07-06 MED ORDER — ONDANSETRON 8 MG/50ML IVPB (CHCC)
8.0000 mg | Freq: Once | INTRAVENOUS | Status: AC
  Administered 2014-07-06: 8 mg via INTRAVENOUS

## 2014-07-06 MED ORDER — ONDANSETRON 8 MG/NS 50 ML IVPB
INTRAVENOUS | Status: AC
  Filled 2014-07-06: qty 8

## 2014-07-06 MED ORDER — SODIUM CHLORIDE 0.9 % IV SOLN
120.0000 mg/m2 | Freq: Once | INTRAVENOUS | Status: AC
  Administered 2014-07-06: 210 mg via INTRAVENOUS
  Filled 2014-07-06: qty 10.5

## 2014-07-06 MED ORDER — DEXAMETHASONE SODIUM PHOSPHATE 10 MG/ML IJ SOLN
10.0000 mg | Freq: Once | INTRAMUSCULAR | Status: AC
  Administered 2014-07-06: 10 mg via INTRAVENOUS

## 2014-07-06 MED ORDER — DEXAMETHASONE SODIUM PHOSPHATE 10 MG/ML IJ SOLN
INTRAMUSCULAR | Status: AC
  Filled 2014-07-06: qty 1

## 2014-07-06 MED ORDER — SODIUM CHLORIDE 0.9 % IV SOLN
Freq: Once | INTRAVENOUS | Status: AC
  Administered 2014-07-06: 12:00:00 via INTRAVENOUS

## 2014-07-06 NOTE — Progress Notes (Signed)
CSW met with patient in infusion room today and enrolled her in [employer] project for temporary housing/community support.  Polo Riley, MSW, LCSW, OSW-C Clinical Social Worker [facility] [phone]

## 2014-07-06 NOTE — Progress Notes (Signed)
Carboplatin/etoposide are not replaceable drugs; will get neulasta replaced.

## 2014-07-06 NOTE — Patient Instructions (Signed)
[facility] Discharge Instructions for Patients Receiving Chemotherapy  Today you received the following chemotherapy agents Etoposide  To help prevent nausea and vomiting after your treatment, we encourage you to take your nausea medication Zofran as directed   If you develop nausea and vomiting that is not controlled by your nausea medication, call the clinic.   BELOW ARE SYMPTOMS THAT SHOULD BE REPORTED IMMEDIATELY:  *FEVER GREATER THAN 100.5 F  *CHILLS WITH OR WITHOUT FEVER  NAUSEA AND VOMITING THAT IS NOT CONTROLLED WITH YOUR NAUSEA MEDICATION  *UNUSUAL SHORTNESS OF BREATH  *UNUSUAL BRUISING OR BLEEDING  TENDERNESS IN MOUTH AND THROAT WITH OR WITHOUT PRESENCE OF ULCERS  *URINARY PROBLEMS  *BOWEL PROBLEMS  UNUSUAL RASH Items with * indicate a potential emergency and should be followed up as soon as possible.  Feel free to call the clinic you have any questions or concerns. The clinic phone number is [phone].

## 2014-07-07 ENCOUNTER — Telehealth: Payer: Self-pay | Admitting: *Deleted

## 2014-07-07 ENCOUNTER — Ambulatory Visit: Payer: Self-pay

## 2014-07-07 ENCOUNTER — Other Ambulatory Visit: Payer: Self-pay | Admitting: Internal Medicine

## 2014-07-07 NOTE — Telephone Encounter (Signed)
Called and informed pt of appts.  She verbalized understanding.

## 2014-07-07 NOTE — Telephone Encounter (Signed)
Pt called stating she will not be able to make it to her appt today.  She is moving into her new housing and will not be able to make it to the cancer center.  Per Dr Vista Mink, we need to delay day 3 until Friday 11/27 and give the injection on Saturday 11/28.  Informed pt that we will call with that appt times.  She verbalized understanding.

## 2014-07-07 NOTE — Telephone Encounter (Signed)
Per staff message I have ascheduled appts. JMW

## 2014-07-09 ENCOUNTER — Telehealth: Payer: Self-pay | Admitting: *Deleted

## 2014-07-09 ENCOUNTER — Other Ambulatory Visit: Payer: Self-pay | Admitting: *Deleted

## 2014-07-09 ENCOUNTER — Ambulatory Visit: Payer: Self-pay

## 2014-07-09 DIAGNOSIS — R918 Other nonspecific abnormal finding of lung field: Secondary | ICD-10-CM

## 2014-07-09 MED ORDER — PROCHLORPERAZINE MALEATE 10 MG PO TABS: 10.0000 mg | ORAL_TABLET | Freq: Four times a day (QID) | ORAL | Status: DC | PRN

## 2014-07-09 MED ORDER — ONDANSETRON 8 MG/NS 50 ML IVPB
INTRAVENOUS | Status: AC
  Filled 2014-07-09: qty 8

## 2014-07-09 MED ORDER — DEXAMETHASONE SODIUM PHOSPHATE 10 MG/ML IJ SOLN
INTRAMUSCULAR | Status: AC
  Filled 2014-07-09: qty 1

## 2014-07-09 NOTE — Telephone Encounter (Signed)
Pt has not arrived for 2:30pm chemo appt.  Attempted to call pt's #, no asnwer, left msg asking her to call the infusion room to let them know if she will be coming today.

## 2014-07-09 NOTE — Telephone Encounter (Signed)
REQUESTED A RETURN CALL TO THE INFUSION ROOM. PT. HAD AN APPOINTMENT TODAY AT 2:30PM.

## 2014-07-09 NOTE — Telephone Encounter (Signed)
Pt's son called stating that pt missed chemo today because "she ate some bad ham and feels nauseated".  She does not have any nausea medication.  Asked for a new rx be sent to [facility] because they deliver.  He asked if we were providing transportation for her Saturday 11/28 for her injection.  Informed him that typically transportation is not provided on the weekend.  Attempted to call the social workers at to verify this but was unsuccessful communicating with anyone due to the late hour of the day on a Friday afternoon.  Per AJ, okay to miss injection appt, advised to keep lab and f/u with AJ on 11/30 to check on WBC and to call if she develops a fever of 100.5.  Tried to call him back to give him this information, no answer, left instructions on his vm.

## 2014-07-10 ENCOUNTER — Ambulatory Visit: Payer: Self-pay

## 2014-07-12 ENCOUNTER — Ambulatory Visit (HOSPITAL_BASED_OUTPATIENT_CLINIC_OR_DEPARTMENT_OTHER): Payer: Self-pay | Admitting: Physician Assistant

## 2014-07-12 ENCOUNTER — Other Ambulatory Visit (HOSPITAL_BASED_OUTPATIENT_CLINIC_OR_DEPARTMENT_OTHER): Payer: Self-pay

## 2014-07-12 ENCOUNTER — Encounter: Payer: Self-pay | Admitting: Physician Assistant

## 2014-07-12 ENCOUNTER — Telehealth: Payer: Self-pay | Admitting: *Deleted

## 2014-07-12 ENCOUNTER — Encounter: Payer: Self-pay | Admitting: Internal Medicine

## 2014-07-12 ENCOUNTER — Telehealth: Payer: Self-pay | Admitting: Physician Assistant

## 2014-07-12 VITALS — BP 111/67 | HR 86 | Temp 99.8°F | Resp 18 | Ht 64.0 in | Wt 139.9 lb

## 2014-07-12 DIAGNOSIS — C3401 Malignant neoplasm of right main bronchus: Secondary | ICD-10-CM

## 2014-07-12 DIAGNOSIS — D709 Neutropenia, unspecified: Secondary | ICD-10-CM

## 2014-07-12 DIAGNOSIS — C7931 Secondary malignant neoplasm of brain: Secondary | ICD-10-CM

## 2014-07-12 DIAGNOSIS — D701 Agranulocytosis secondary to cancer chemotherapy: Secondary | ICD-10-CM

## 2014-07-12 DIAGNOSIS — B370 Candidal stomatitis: Secondary | ICD-10-CM

## 2014-07-12 DIAGNOSIS — C3491 Malignant neoplasm of unspecified part of right bronchus or lung: Secondary | ICD-10-CM

## 2014-07-12 DIAGNOSIS — C7989 Secondary malignant neoplasm of other specified sites: Secondary | ICD-10-CM

## 2014-07-12 DIAGNOSIS — C3490 Malignant neoplasm of unspecified part of unspecified bronchus or lung: Secondary | ICD-10-CM

## 2014-07-12 DIAGNOSIS — Z5189 Encounter for other specified aftercare: Secondary | ICD-10-CM

## 2014-07-12 DIAGNOSIS — T451X5A Adverse effect of antineoplastic and immunosuppressive drugs, initial encounter: Secondary | ICD-10-CM

## 2014-07-12 DIAGNOSIS — R918 Other nonspecific abnormal finding of lung field: Secondary | ICD-10-CM

## 2014-07-12 LAB — CBC WITH DIFFERENTIAL/PLATELET
BASO%: 0.3 % (ref 0.0–2.0)
BASOS ABS: 0 10*3/uL (ref 0.0–0.1)
EOS%: 4.3 % (ref 0.0–7.0)
Eosinophils Absolute: 0 10*3/uL (ref 0.0–0.5)
HCT: 35.6 % (ref 34.8–46.6)
HEMOGLOBIN: 11.4 g/dL — AB (ref 11.6–15.9)
LYMPH#: 0.4 10*3/uL — AB (ref 0.9–3.3)
LYMPH%: 40.4 % (ref 14.0–49.7)
MCH: 30.3 pg (ref 25.1–34.0)
MCHC: 32.1 g/dL (ref 31.5–36.0)
MCV: 94.2 fL (ref 79.5–101.0)
MONO#: 0 10*3/uL — ABNORMAL LOW (ref 0.1–0.9)
MONO%: 2.6 % (ref 0.0–14.0)
NEUT%: 52.4 % (ref 38.4–76.8)
NEUTROS ABS: 0.6 10*3/uL — AB (ref 1.5–6.5)
Platelets: 179 10*3/uL (ref 145–400)
RBC: 3.78 10*6/uL (ref 3.70–5.45)
RDW: 15.2 % — AB (ref 11.2–14.5)
WBC: 1.1 10*3/uL — ABNORMAL LOW (ref 3.9–10.3)

## 2014-07-12 LAB — COMPREHENSIVE METABOLIC PANEL (CC13)
ALBUMIN: 2.5 g/dL — AB (ref 3.5–5.0)
ALK PHOS: 100 U/L (ref 40–150)
ALT: 35 U/L (ref 0–55)
AST: 26 U/L (ref 5–34)
Anion Gap: 11 mEq/L (ref 3–11)
BUN: 15.8 mg/dL (ref 7.0–26.0)
CALCIUM: 9.6 mg/dL (ref 8.4–10.4)
CO2: 24 mEq/L (ref 22–29)
Chloride: 98 mEq/L (ref 98–109)
Creatinine: 0.8 mg/dL (ref 0.6–1.1)
Glucose: 140 mg/dl (ref 70–140)
POTASSIUM: 3.9 meq/L (ref 3.5–5.1)
SODIUM: 133 meq/L — AB (ref 136–145)
TOTAL PROTEIN: 7 g/dL (ref 6.4–8.3)
Total Bilirubin: 0.54 mg/dL (ref 0.20–1.20)

## 2014-07-12 MED ORDER — OXYCODONE-ACETAMINOPHEN 5-325 MG PO TABS: 1.0000 | ORAL_TABLET | Freq: Four times a day (QID) | ORAL | Status: DC | PRN

## 2014-07-12 MED ORDER — PROCHLORPERAZINE MALEATE 10 MG PO TABS: 10.0000 mg | ORAL_TABLET | Freq: Four times a day (QID) | ORAL | Status: DC | PRN

## 2014-07-12 MED ORDER — CIPROFLOXACIN HCL 500 MG PO TABS
500.0000 mg | ORAL_TABLET | Freq: Two times a day (BID) | ORAL | Status: DC
Start: 2014-07-12 — End: 2014-07-12

## 2014-07-12 MED ORDER — FLUCONAZOLE 100 MG PO TABS: 100.0000 mg | ORAL_TABLET | Freq: Every day | ORAL | Status: DC

## 2014-07-12 MED ORDER — PEGFILGRASTIM INJECTION 6 MG/0.6ML ~~LOC~~
6.0000 mg | PREFILLED_SYRINGE | Freq: Once | SUBCUTANEOUS | Status: AC
  Administered 2014-07-12: 6 mg via SUBCUTANEOUS
  Filled 2014-07-12: qty 0.6

## 2014-07-12 MED ORDER — DEXAMETHASONE 4 MG PO TABS: 4.0000 mg | ORAL_TABLET | Freq: Two times a day (BID) | ORAL | Status: DC

## 2014-07-12 MED ORDER — CIPROFLOXACIN HCL 500 MG PO TABS: 500.0000 mg | ORAL_TABLET | Freq: Two times a day (BID) | ORAL | Status: DC

## 2014-07-12 NOTE — Patient Instructions (Addendum)
Pegfilgrastim injection What is this medicine? PEGFILGRASTIM (peg fil GRA stim) is a long-acting granulocyte colony-stimulating factor that stimulates the growth of neutrophils, a type of white blood cell important in the body's fight against infection. It is used to reduce the incidence of fever and infection in patients with certain types of cancer who are receiving chemotherapy that affects the bone marrow. This medicine may be used for other purposes; ask your health care provider or pharmacist if you have questions. COMMON BRAND NAME(S): Neulasta What should I tell my health care provider before I take this medicine? They need to know if you have any of these conditions: -latex allergy -ongoing radiation therapy -sickle cell disease -skin reactions to acrylic adhesives (On-Body Injector only) -an unusual or allergic reaction to pegfilgrastim, filgrastim, other medicines, foods, dyes, or preservatives -pregnant or trying to get pregnant -breast-feeding How should I use this medicine? This medicine is for injection under the skin. If you get this medicine at home, you will be taught how to prepare and give the pre-filled syringe or how to use the On-body Injector. Refer to the patient Instructions for Use for detailed instructions. Use exactly as directed. Take your medicine at regular intervals. Do not take your medicine more often than directed. It is important that you put your used needles and syringes in a special sharps container. Do not put them in a trash can. If you do not have a sharps container, call your pharmacist or healthcare provider to get one. Talk to your pediatrician regarding the use of this medicine in children. Special care may be needed. Overdosage: If you think you have taken too much of this medicine contact a poison control center or emergency room at once. NOTE: This medicine is only for you. Do not share this medicine with others. What if I miss a dose? It is  important not to miss your dose. Call your doctor or health care professional if you miss your dose. If you miss a dose due to an On-body Injector failure or leakage, a new dose should be administered as soon as possible using a single prefilled syringe for manual use. What may interact with this medicine? Interactions have not been studied. Give your health care provider a list of all the medicines, herbs, non-prescription drugs, or dietary supplements you use. Also tell them if you smoke, drink alcohol, or use illegal drugs. Some items may interact with your medicine. This list may not describe all possible interactions. Give your health care provider a list of all the medicines, herbs, non-prescription drugs, or dietary supplements you use. Also tell them if you smoke, drink alcohol, or use illegal drugs. Some items may interact with your medicine. What should I watch for while using this medicine? You may need blood work done while you are taking this medicine. If you are going to need a MRI, CT scan, or other procedure, tell your doctor that you are using this medicine (On-Body Injector only). What side effects may I notice from receiving this medicine? Side effects that you should report to your doctor or health care professional as soon as possible: -allergic reactions like skin rash, itching or hives, swelling of the face, lips, or tongue -dizziness -fever -pain, redness, or irritation at site where injected -pinpoint red spots on the skin -shortness of breath or breathing problems -stomach or side pain, or pain at the shoulder -swelling -tiredness -trouble passing urine Side effects that usually do not require medical attention (report to your doctor   or health care professional if they continue or are bothersome): -bone pain -muscle pain This list may not describe all possible side effects. Call your doctor for medical advice about side effects. You may report side effects to FDA at  1-800-FDA-1088. Where should I keep my medicine? Keep out of the reach of children. Store pre-filled syringes in a refrigerator between 2 and 8 degrees C (36 and 46 degrees F). Do not freeze. Keep in carton to protect from light. Throw away this medicine if it is left out of the refrigerator for more than 48 hours. Throw away any unused medicine after the expiration date. NOTE: This sheet is a summary. It may not cover all possible information. If you have questions about this medicine, talk to your doctor, pharmacist, or health care provider.  2015, Elsevier/Gold Standard. (2013-10-29 16:14:05)  Continue weekly labs as scheduled You were given a Neulasta injection to help build your white blood cell count. Take the antibiotic as prescribed Take the diflucan as presrcibed to treat the thrush in your mouth Follow up in 2 weeks prior to your next scheduled cycle of chemotherapy

## 2014-07-12 NOTE — Telephone Encounter (Signed)
Pt was seen today check out by AB no POF, no POF by 5:00, if pt calls there is nothing scheduled for tomorrow..... KJ

## 2014-07-12 NOTE — Progress Notes (Addendum)
[facility] [phone]   [phone]  OFFICE PROGRESS NOTE  No PCP Per Patient No address on file  DIAGNOSIS: Extensive stage small cell lung cancer diagnosed in November 2015 presented with large right hilar mass with direct mediastinal invasion in addition to mediastinal lymphadenopathy and metastatic lesion in the subcutaneous fat of the left flank.  PRIOR THERAPY: None  CURRENT THERAPY: Systemic chemotherapy with carboplatin for AUC of 5 on day 1 and etoposide 120 MG/M2 on days 1, 2 and 3 with Neulasta support on day 4. First cycle on 07/05/2014.  INTERVAL HISTORY: Mariah Park 58 y.o. female returns to the clinic today for follow-up visit. She presents today for a symptom management visit after receiving her first cycle of systemic chemotherapy with carboplatin. Unfortunately due to problems with transportation, she only received days 1 and 2 of cycle #1. She did not receive day 3 or Neulata as scheduled. She had repeat labs today and also present to discuss the results. She states she tolerated the chemotherapy she did receive without difficulty. She does complain of some headaches and is supposed to be on dexamethasone. She states she is having difficulty getting her prescriptions.She continues to have shortness of breath with exertion in addition to mild cough with no hemoptysis. The patient denied having any fever or chills, no nausea or vomiting. She denied having any significant weight loss or night sweats.    MEDICAL HISTORY: Past Medical History  Diagnosis Date  . Brain metastases 06/21/2014    ALLERGIES:  has No Known Allergies.  MEDICATIONS:  Current Outpatient Prescriptions  Medication Sig Dispense Refill  . Multiple Vitamins-Minerals (MULTIVITAMIN WITH MINERALS) tablet Take 1 tablet by mouth daily.    . ciprofloxacin (CIPRO) 500 MG tablet Take 1 tablet (500 mg total) by mouth 2 (two) times daily. 10 tablet 0  . dexamethasone  (DECADRON) 4 MG tablet Take 1 tablet (4 mg total) by mouth 2 (two) times daily. 30 tablet 0  . fluconazole (DIFLUCAN) 100 MG tablet Take 1 tablet (100 mg total) by mouth daily. 10 tablet 0  . oxyCODONE-acetaminophen (PERCOCET) 5-325 MG per tablet Take 1 tablet by mouth every 6 (six) hours as needed. 30 tablet 0  . prochlorperazine (COMPAZINE) 10 MG tablet Take 1 tablet (10 mg total) by mouth every 6 (six) hours as needed for nausea or vomiting. 30 tablet 0   No current facility-administered medications for this visit.    SURGICAL HISTORY: History reviewed. No pertinent past surgical history.  REVIEW OF SYSTEMS:  Constitutional: negative Eyes: negative Ears, nose, mouth, throat, and face: negative Respiratory: positive for cough, dyspnea on exertion and pleurisy/chest pain Cardiovascular: negative Gastrointestinal: negative Genitourinary:negative Integument/breast: negative Hematologic/lymphatic: negative Musculoskeletal:negative Neurological: negative Behavioral/Psych: negative Endocrine: negative Allergic/Immunologic: negative   PHYSICAL EXAMINATION: General appearance: alert, cooperative and no distress Head: Normocephalic, without obvious abnormality, atraumatic Neck: no adenopathy, no JVD, supple, symmetrical, trachea midline and thyroid not enlarged, symmetric, no tenderness/mass/nodules Lymph nodes: Cervical, supraclavicular, and axillary nodes normal. Resp: clear to auscultation bilaterally Back: symmetric, no curvature. ROM normal. No CVA tenderness. Cardio: regular rate and rhythm, S1, S2 normal, no murmur, click, rub or gallop GI: soft, non-tender; bowel sounds normal; no masses,  no organomegaly Extremities: extremities normal, atraumatic, no cyanosis or edema Neurologic: Alert and oriented X 3, normal strength and tone. Normal symmetric reflexes. Normal coordination and gait Mouth: reveals thrush  ECOG PERFORMANCE STATUS: 1 - Symptomatic but completely  ambulatory  Blood pressure  111/67, pulse 86, temperature 99.8 F (37.7 C), temperature source Oral, resp. rate 18, height 5\' 4"  (1.626 m), weight 139 lb 14.4 oz (63.458 kg). I question the accuracy ofher previous weight of 152 pounds and 12.8 ounces on 07/02/2014  LABORATORY DATA: Lab Results  Component Value Date   WBC 1.1* 07/12/2014   HGB 11.4* 07/12/2014   HCT 35.6 07/12/2014   MCV 94.2 07/12/2014   PLT 179 07/12/2014      Chemistry      Component Value Date/Time   NA 133* 07/12/2014 1414   NA 144 06/01/2014 1614   K 3.9 07/12/2014 1414   K 3.9 06/01/2014 1614   CL 106 06/01/2014 1614   CO2 24 07/12/2014 1414   CO2 25 06/01/2014 1614   BUN 15.8 07/12/2014 1414   BUN 5* 06/01/2014 1614   CREATININE 0.8 07/12/2014 1414   CREATININE 0.55 06/01/2014 1614      Component Value Date/Time   CALCIUM 9.6 07/12/2014 1414   CALCIUM 9.8 06/01/2014 1614   ALKPHOS 100 07/12/2014 1414   AST 26 07/12/2014 1414   ALT 35 07/12/2014 1414   BILITOT 0.54 07/12/2014 1414       RADIOGRAPHIC STUDIES: Mr Jeri Cos Wo Contrast  29-Jun-2014   CLINICAL DATA:  Lung cancer staging  EXAM: MRI HEAD WITHOUT AND WITH CONTRAST  TECHNIQUE: Multiplanar, multiecho pulse sequences of the brain and surrounding structures were obtained without and with intravenous contrast.  CONTRAST:  43mL MULTIHANCE GADOBENATE DIMEGLUMINE 529 MG/ML IV SOLN  COMPARISON:  None.  FINDINGS: Multiple enhancing lesions are present consistent with metastatic disease.  5 mm lesion right lower cerebellum.  7 mm right parietal enhancing lesion. Small amount of hemorrhage associated with this lesion.  6 mm enhancing lesion over the frontal parietal convexity  2 x 3 mm left parietal cortical lesion over the convexity.  Minimal edema.  No mass effect or midline shift.  Ventricle size is normal.  No acute infarct.  IMPRESSION: Four metastatic deposits in the brain. Right parietal lesion shows mild hemorrhage.   Electronically Signed   By:  Franchot Gallo M.D.   On: Jun 29, 2014 17:17   Nm Pet Image Initial (pi) Skull Base To Thigh  06-29-14   CLINICAL DATA:  Initial treatment strategy for lung mass.  EXAM: NUCLEAR MEDICINE PET SKULL BASE TO THIGH  TECHNIQUE: 6.8 mCi F-18 FDG was injected intravenously. Full-ring PET imaging was performed from the skull base to thigh after the radiotracer. CT data was obtained and used for attenuation correction and anatomic localization.  FASTING BLOOD GLUCOSE:  Value: 96 mg/dl  COMPARISON:  Chest CT 06/01/2014.  FINDINGS: NECK  No hypermetabolic lymph nodes in the neck.  CHEST  Again noted is a large mass centered in the right hilar region, with direct invasion of the mediastinum, measuring approximately 7.3 x 8.0 cm (SUVmax = 14.4). This mass completely encases the distal right mainstem bronchus and in the right upper lobe bronchus, which is markedly narrowed and nearly completely occluded. The right upper lobe remains patent at this time. The mass completely encases the bronchus intermedius, right middle lobe bronchi and right lower lobe bronchi, which appear completely occluded at this time, although portions of the right middle and lower lobes remain aerated. There are extensive postobstructive changes in the right lung, most severe in the right middle and lower lobes, predominantly postobstructive pneumonitis. However, some of these areas are somewhat nodular in appearance and demonstrate low levels of hypermetabolism such that lymphangitic spread  of tumor throughout these areas of the lung is difficult to entirely exclude. Bulky high right paratracheal lymphadenopathy is noted measuring up to 3.9 x 3.3 cm (SUVmax = 10.6). The left lung as well aerated. No pleural effusions. Mild cardiomegaly. There is atherosclerosis of the thoracic aorta, the great vessels of the mediastinum and the coronary arteries, including calcified atherosclerotic plaque in the left main, left anterior descending and left circumflex  coronary arteries.  ABDOMEN/PELVIS  In the subcutaneous soft tissues of the left flank there is a 2.4 x 2.1 cm hypermetabolic (SUVmax = 9.1) soft tissue attenuation nodule, presumably a soft tissue metastasis. No abnormal hypermetabolic activity within the liver, pancreas, adrenal glands, or spleen. No hypermetabolic lymph nodes in the abdomen or pelvis. Well-defined 2.8 cm low-attenuation lesion in the upper pole of the left kidney was previously characterized as a simple cyst. Extensive atherosclerosis throughout the abdominal and pelvic vasculature, without definite aneurysm on today's noncontrast CT examination.  SKELETON  No destructive hypermetabolic skeletal lesions to suggest osseous metastasis at this time.  IMPRESSION: 1. 7.3 x 8.0 cm right lung mass in the right hilar region with direct mediastinal invasion, potential lymphangitic spread of tumor in the base of the right lung, high right peritracheal lymphadenopathy, and metastatic soft tissue implant in the subcutaneous fat of the left flank. Findings are most compatible with stage IV lung cancer (T4, N2, M1b). 2. Atherosclerosis, including left main and 3 vessel coronary artery disease. 3. Additional incidental findings, as above.   Electronically Signed   By: Vinnie Langton M.D.   On: 06/18/2014 15:32   US Biopsy  06/30/2014   CLINICAL DATA:  Hypermetabolic right lung mass. Hypermetabolic nodule in the subcutaneous tissues of the left lateral flank.  EXAM: ULTRASOUND GUIDED CORE BIOPSY OF LEFT SUBCUTANEOUS NODULE  MEDICATIONS: Lidocaine 1% subcutaneous 10 mL  PROCEDURE: The procedure, risks, benefits, and alternatives were explained to the patient. Questions regarding the procedure were encouraged and answered. The patient understands and consents to the procedure.  Survey ultrasound of the left flank region was performed. A 3cm hypoechoic solid mass was localized corresponding to the hypermetabolic lesion on previous PET-CT. An appropriate skin  entry site was determined and marked.  The operative field was prepped with Betadine in a sterile fashion, and a sterile drape was applied covering the operative field. A sterile gown and sterile gloves were used for the procedure. Local anesthesia was provided with 1% Lidocaine.  Under real-time ultrasound guidance, a 17 gauge trocar needle was advanced to the margin of the lesion. Once needle tip position was confirmed, coaxial 18-gauge core biopsy samples were obtained, submitted in formalin to surgical pathology. The guide needle was removed. Postprocedure scans reveal no hematoma or other apparent complication.  COMPLICATIONS: None.  FINDINGS: 3 cm subcutaneous left flank mass localize corresponding to hypermetabolic lesion on PET-CT. Ultrasound-guided core biopsy performed.  IMPRESSION: 1. Technically successful ultrasound-guided core biopsy of left flank subcutaneous mass.   Electronically Signed   By: Arne Cleveland M.D.   On: 06/30/2014 16:16    ASSESSMENT AND PLAN: This is a very pleasant 58 years old African-American female with:  1) Extensive stage small cell lung cancer presented with a right lung mass in the right hilar area with direct mediastinal invasion as well as metastatic soft tissue implant in the subcutaneous fat end of the left flank area and few metastatic brain lesions. I had a lengthy discussion with the patient today about her current disease stage, prognosis and treatment  options. I recommended for the patient systemic chemotherapy with carboplatin for AUC of 5 on day 1 and etoposide 120 MG/M2 on days 1, 2 and 3 with Neulasta support on day 4. I discussed with the patient adverse effect of the chemotherapy including but not limited to alopecia, myelosuppression, nausea and vomiting, peripheral neuropathy, liver or renal dysfunction. As stated above, the patient only received days 1 and 2. The patient was discussed with and also seen by Dr. Julien Nordmann. She is neutropenic today with a  total white cell count of 1.1 and ANC of 0.6. She will receive a Neulasta injection today for neutrophil support. We will also place her on empiric antibiotic coverage with Cipro 500 mg by mouth twice daily for 5 days.THe patient was seen by Polo Riley, our social worker. Prescriptions for Cipro, dexamethasone and Compazine were sent to the [facility] in addition to a prescription for diflucan to treat her oral candidiasis.Marland Kitchen 2) metastatic brain lesions: Continue  Decadron 4 mg by mouth twice a day for the metastatic brain lesions. She was evaluated by Dr. Sondra Come and she would be considered for treatment in the future after control of her systemic disease.  She will continue weekly labs as scheduled. She will follow up in 2 weeks, prior to the start of cycle #2. The patient was advised to call immediately if she has any concerning symptoms in the interval. The patient voices understanding of current disease status and treatment options and is in agreement with the current care plan.  All questions were answered. The patient knows to call the clinic with any problems, questions or concerns. We can certainly see the patient much sooner if necessary.  Carlton Adam, PA-C 07/12/2014  ADDENDUM: Hematology/Oncology Attending: I had a face to face encounter with the patient today. I recommended her care plan. This is a very pleasant 58 years old African-American female with extensive stage small cell lung cancer currently undergoing systemic chemotherapy with carboplatin and etoposide status post 1 cycle. The patient missed day 3 of the first cycle as well as a Neulasta injection because of transportation issues. Her absolute neutrophil count is low today. I recommended for the patient to receive Neulasta injection today. I would also start the patient on prophylactic treatment with Cipro 500 mg by mouth twice a day for 5 days. I strongly encouraged her to keep her appointments  and she would see the social worker at the [facility] to help with her transportation. She would have repeat bloodwork next week. She would come back for follow-up visit in 2 weeks for reevaluation before starting cycle #2. The patient was advised to call immediately if she has any concerning symptoms in the interval.   Disclaimer: This note was dictated with voice recognition software. Similar sounding words can inadvertently be transcribed and may not be corrected upon review. Eilleen Kempf., MD 07/12/2014

## 2014-07-12 NOTE — Progress Notes (Signed)
Per Skip H ok for Mariah Park to write letter of support for patient. [facility] is helping her with housing. She has no means of support. 400.00 grant appproved.

## 2014-07-12 NOTE — Telephone Encounter (Signed)
Patient called and needed a ride to treatment.  I called her back and explained we don't have the resources to send transportation to get her to treatment.  I did tell her that Ander Purpura will be filling out [employer] transportation forms to help with future transportation needs.  He stated she would call around to find transportation to cancer center.

## 2014-07-21 ENCOUNTER — Telehealth: Payer: Self-pay | Admitting: Medical Oncology

## 2014-07-21 ENCOUNTER — Other Ambulatory Visit: Payer: Self-pay | Admitting: Medical Oncology

## 2014-07-21 DIAGNOSIS — C3490 Malignant neoplasm of unspecified part of unspecified bronchus or lung: Secondary | ICD-10-CM

## 2014-07-21 MED ORDER — OXYCODONE-ACETAMINOPHEN 5-325 MG PO TABS: 1.0000 | ORAL_TABLET | Freq: Four times a day (QID) | ORAL | Status: DC | PRN

## 2014-07-21 NOTE — Progress Notes (Signed)
Wayna Chalet, RN at [employer] pt needs refill. Refill locked up in injection room.

## 2014-07-21 NOTE — Telephone Encounter (Signed)
Refill done.  

## 2014-07-26 ENCOUNTER — Encounter: Payer: Self-pay | Admitting: Nurse Practitioner

## 2014-07-26 ENCOUNTER — Ambulatory Visit (HOSPITAL_BASED_OUTPATIENT_CLINIC_OR_DEPARTMENT_OTHER): Payer: Self-pay

## 2014-07-26 ENCOUNTER — Other Ambulatory Visit: Payer: Self-pay | Admitting: *Deleted

## 2014-07-26 ENCOUNTER — Other Ambulatory Visit: Payer: Self-pay | Admitting: Internal Medicine

## 2014-07-26 ENCOUNTER — Telehealth: Payer: Self-pay | Admitting: Internal Medicine

## 2014-07-26 ENCOUNTER — Ambulatory Visit (HOSPITAL_BASED_OUTPATIENT_CLINIC_OR_DEPARTMENT_OTHER): Payer: Self-pay | Admitting: Nurse Practitioner

## 2014-07-26 ENCOUNTER — Other Ambulatory Visit (HOSPITAL_BASED_OUTPATIENT_CLINIC_OR_DEPARTMENT_OTHER): Payer: Self-pay

## 2014-07-26 DIAGNOSIS — C7931 Secondary malignant neoplasm of brain: Secondary | ICD-10-CM

## 2014-07-26 DIAGNOSIS — Z5111 Encounter for antineoplastic chemotherapy: Secondary | ICD-10-CM

## 2014-07-26 DIAGNOSIS — G893 Neoplasm related pain (acute) (chronic): Secondary | ICD-10-CM | POA: Insufficient documentation

## 2014-07-26 DIAGNOSIS — B370 Candidal stomatitis: Secondary | ICD-10-CM

## 2014-07-26 DIAGNOSIS — C3491 Malignant neoplasm of unspecified part of right bronchus or lung: Secondary | ICD-10-CM

## 2014-07-26 LAB — CBC WITH DIFFERENTIAL/PLATELET
BASO%: 0.5 % (ref 0.0–2.0)
BASOS ABS: 0.3 10*3/uL — AB (ref 0.0–0.1)
EOS%: 0 % (ref 0.0–7.0)
Eosinophils Absolute: 0 10*3/uL (ref 0.0–0.5)
HEMATOCRIT: 38.7 % (ref 34.8–46.6)
HGB: 12.5 g/dL (ref 11.6–15.9)
LYMPH%: 11.5 % — AB (ref 14.0–49.7)
MCH: 30.3 pg (ref 25.1–34.0)
MCHC: 32.3 g/dL (ref 31.5–36.0)
MCV: 93.8 fL (ref 79.5–101.0)
MONO#: 1.4 10*3/uL — AB (ref 0.1–0.9)
MONO%: 3 % (ref 0.0–14.0)
NEUT#: 40.6 10*3/uL — ABNORMAL HIGH (ref 1.5–6.5)
NEUT%: 85 % — AB (ref 38.4–76.8)
PLATELETS: 254 10*3/uL (ref 145–400)
RBC: 4.12 10*6/uL (ref 3.70–5.45)
RDW: 18.3 % — ABNORMAL HIGH (ref 11.2–14.5)
WBC: 47.8 10*3/uL — ABNORMAL HIGH (ref 3.9–10.3)
lymph#: 5.5 10*3/uL — ABNORMAL HIGH (ref 0.9–3.3)

## 2014-07-26 LAB — COMPREHENSIVE METABOLIC PANEL (CC13)
ALT: 51 U/L (ref 0–55)
AST: 22 U/L (ref 5–34)
Albumin: 3.1 g/dL — ABNORMAL LOW (ref 3.5–5.0)
Alkaline Phosphatase: 125 U/L (ref 40–150)
Anion Gap: 11 mEq/L (ref 3–11)
BILIRUBIN TOTAL: 0.21 mg/dL (ref 0.20–1.20)
BUN: 22 mg/dL (ref 7.0–26.0)
CO2: 26 mEq/L (ref 22–29)
CREATININE: 0.7 mg/dL (ref 0.6–1.1)
Calcium: 9 mg/dL (ref 8.4–10.4)
Chloride: 103 mEq/L (ref 98–109)
Glucose: 97 mg/dl (ref 70–140)
Potassium: 4.5 mEq/L (ref 3.5–5.1)
Sodium: 140 mEq/L (ref 136–145)
Total Protein: 5.9 g/dL — ABNORMAL LOW (ref 6.4–8.3)

## 2014-07-26 MED ORDER — DEXAMETHASONE SODIUM PHOSPHATE 20 MG/5ML IJ SOLN
INTRAMUSCULAR | Status: AC
  Filled 2014-07-26: qty 5

## 2014-07-26 MED ORDER — SODIUM CHLORIDE 0.9 % IV SOLN
120.0000 mg/m2 | Freq: Once | INTRAVENOUS | Status: AC
  Administered 2014-07-26: 210 mg via INTRAVENOUS
  Filled 2014-07-26: qty 10.5

## 2014-07-26 MED ORDER — SODIUM CHLORIDE 0.9 % IV SOLN
Freq: Once | INTRAVENOUS | Status: AC
  Administered 2014-07-26: 10:00:00 via INTRAVENOUS

## 2014-07-26 MED ORDER — SODIUM CHLORIDE 0.9 % IV SOLN
554.5000 mg | Freq: Once | INTRAVENOUS | Status: AC
  Administered 2014-07-26: 550 mg via INTRAVENOUS
  Filled 2014-07-26: qty 55

## 2014-07-26 MED ORDER — DEXAMETHASONE 4 MG PO TABS: 4.0000 mg | ORAL_TABLET | Freq: Two times a day (BID) | ORAL | Status: DC

## 2014-07-26 MED ORDER — ONDANSETRON 16 MG/50ML IVPB (CHCC)
16.0000 mg | Freq: Once | INTRAVENOUS | Status: AC
  Administered 2014-07-26: 16 mg via INTRAVENOUS

## 2014-07-26 MED ORDER — DEXAMETHASONE SODIUM PHOSPHATE 20 MG/5ML IJ SOLN
20.0000 mg | Freq: Once | INTRAMUSCULAR | Status: AC
  Administered 2014-07-26: 20 mg via INTRAVENOUS

## 2014-07-26 MED ORDER — ONDANSETRON 16 MG/50ML IVPB (CHCC)
INTRAVENOUS | Status: AC
  Filled 2014-07-26: qty 16

## 2014-07-26 NOTE — Patient Instructions (Signed)
[facility] Discharge Instructions for Patients Receiving Chemotherapy  Today you received the following chemotherapy agents etoposide/carboplatin  To help prevent nausea and vomiting after your treatment, we encourage you to take your nausea medication as directed   If you develop nausea and vomiting that is not controlled by your nausea medication, call the clinic.   BELOW ARE SYMPTOMS THAT SHOULD BE REPORTED IMMEDIATELY:  *FEVER GREATER THAN 100.5 F  *CHILLS WITH OR WITHOUT FEVER  NAUSEA AND VOMITING THAT IS NOT CONTROLLED WITH YOUR NAUSEA MEDICATION  *UNUSUAL SHORTNESS OF BREATH  *UNUSUAL BRUISING OR BLEEDING  TENDERNESS IN MOUTH AND THROAT WITH OR WITHOUT PRESENCE OF ULCERS  *URINARY PROBLEMS  *BOWEL PROBLEMS  UNUSUAL RASH Items with * indicate a potential emergency and should be followed up as soon as possible.  Feel free to call the clinic you have any questions or concerns. The clinic phone number is [phone].

## 2014-07-26 NOTE — Assessment & Plan Note (Addendum)
Patient initiated cycle 1 of her carboplatin/etoposide chemotherapy regimen on 07/05/2014.  Due to transportation issues-patient was only able to receive cycle 1, day 1 and 2 only of her last chemotherapy cycle.  She also missed her scheduled Neulasta injection.  She was however, given a Neulasta injection on 07/12/2014.  Reviewed all of patient's lab results with her in detail today.  Blood counts remained stable; with an elevated  CBC secondary to Neulasta injection.  Patient will proceed today with cycle 2, day 1 of her carboplatin/etoposide chemotherapy regimen.  She has plans to return tomorrow for cycle 2, day 2 of etoposide only portion of her chemotherapy.  Patient will need to obtain a restaging CT with contrast of the chest/abdomen/pelvis on 08/11/2014.  Patient will need to return for cycle 3 of the same chemotherapy regimen on 08/16/2014.  Patient has already met with Dr. Sondra Come radiation oncologist for further evaluation; and possible radiation therapy post chemotherapy.  Patient was also advised that she needed to have some Claritin on hand to take when she receives her next Neulasta injection.

## 2014-07-26 NOTE — Assessment & Plan Note (Signed)
Patient has been diagnosed with brain metastasis.  She has been taken dexamethasone 4 mg twice daily as previously directed.  Patient is requesting a refill of the dexamethasone today.

## 2014-07-26 NOTE — Assessment & Plan Note (Signed)
Patient was noted to have some thrush a few weeks ago.  Patient has completed Diflucan 1 week.  It does appear that all thrush symptoms have since resolved; the patient has no further oral lesions.  Encouraged patient to continue with baking soda/salt swish and spit.

## 2014-07-26 NOTE — Assessment & Plan Note (Signed)
Patient continues to complain of some generalized pain; and specific pain to her chest wall when she coughs.  She denies any specific chest pain, chest pressure, worsening shortness of breath or pain with inspiration.  Patient states she has been taking Percocet 5/325 mg tablet one every 6 hours with only minimal effectiveness.  Advised patient that she could increase the Percocet tablets to 2 tablets every 4-6 hours and see if that helps.

## 2014-07-26 NOTE — Telephone Encounter (Signed)
added appts....pt will get sched in chemo

## 2014-07-26 NOTE — Progress Notes (Signed)
will   SYMPTOM MANAGEMENT CLINIC   HPI: Mariah Park 58 y.o. female diagnosed with lung cancer; with brain metastasis.  Currently undergoing carboplatin/etoposide chemotherapy regimen.  Patient presents today to receive cycle 2, day 1 of her carboplatin/etoposide chemotherapy regimen.  She initiated this regimen on 07/05/2014; but only received 2 out of the 3 scheduled days of chemotherapy due to transportation issues.  She also received her Neulasta injection late on 07/12/2014 due to transportation issues.  Patient states that since that time-she has met with the [place] worker and has all of her transportation arranged per [facility].  Patient has also been diagnosed with brain metastasis; and takes dexamethasone 4 mg twice daily on a regular basis.  She's requesting a refill of the dexamethasone today.  Patient states that she no longer has any thrush or oral lesions symptoms whatsoever.  She completed a course of oral Diflucan.  Patient is complaining of some chronic, productive cough and occasional wheezing at night.  She denies any recent fevers or chills.  Patient's also complaining of some continued, chest wall discomfort when she coughs.  She states she's been taking Percocet 1 tablet every 4-6 hours with only minimal effectiveness.   HPI  CURRENT THERAPY: Upcoming Treatment Dates - LUNG SMALL CELL Carboplatin D1 / Etoposide D1-3 q21d Days with orders from any treatment category:  07/27/2014      SCHEDULING COMMUNICATION      ondansetron (ZOFRAN) IVPB 8 mg      dexamethasone (DECADRON) injection 10 mg      etoposide (VEPESID) 210 mg in sodium chloride 0.9 % 600 mL chemo infusion      sodium chloride 0.9 % injection 10 mL      heparin lock flush 100 unit/mL      heparin lock flush 100 unit/mL      alteplase (CATHFLO ACTIVASE) injection 2 mg      sodium chloride 0.9 % injection 3 mL      Hot Pack 1 packet      0.9 %  sodium chloride infusion 07/28/2014  SCHEDULING COMMUNICATION      ondansetron (ZOFRAN) IVPB 8 mg      dexamethasone (DECADRON) injection 10 mg      etoposide (VEPESID) 210 mg in sodium chloride 0.9 % 600 mL chemo infusion      sodium chloride 0.9 % injection 10 mL      heparin lock flush 100 unit/mL      heparin lock flush 100 unit/mL      alteplase (CATHFLO ACTIVASE) injection 2 mg      sodium chloride 0.9 % injection 3 mL      Hot Pack 1 packet      0.9 %  sodium chloride infusion 07/29/2014      SCHEDULING COMMUNICATION INJECTION      pegfilgrastim (NEULASTA) injection 6 mg    ROS  Past Medical History  Diagnosis Date  . Brain metastases 06/21/2014    History reviewed. No pertinent past surgical history.  has Lung mass; Brain metastases; Small cell carcinoma of lung; Neoplasm related pain; and Candida infection, oral on her problem list.     has No Known Allergies.    Medication List       This list is accurate as of: 07/26/14 11:08 AM.  Always use your most recent med list.               dexamethasone 4 MG tablet  Commonly known as:  DECADRON  Take 1 tablet (4 mg total) by mouth 2 (two) times daily.     multivitamin with minerals tablet  Take 1 tablet by mouth daily.     oxyCODONE-acetaminophen 5-325 MG per tablet  Commonly known as:  PERCOCET  Take 1 tablet by mouth every 6 (six) hours as needed.     prochlorperazine 10 MG tablet  Commonly known as:  COMPAZINE  Take 1 tablet (10 mg total) by mouth every 6 (six) hours as needed for nausea or vomiting.         PHYSICAL EXAMINATION  Vitals: BP 125/79, HR 100, temp 98.8, 97%  Physical Exam  Constitutional: She is oriented to person, place, and time. Vital signs are normal. She appears unhealthy.  Patient appeared in fairly good spirits today.  Patient was observed eating ice cream; and preparation to eating both a sandwich and chips.  HENT:  Head: Normocephalic and atraumatic.  Mouth/Throat: Oropharynx is clear and moist.  No oral  lesions or thrush noted on exam today.  Eyes: Conjunctivae and EOM are normal. Pupils are equal, round, and reactive to light. Right eye exhibits no discharge. Left eye exhibits no discharge. No scleral icterus.  Neck: Normal range of motion. Neck supple. No JVD present. No tracheal deviation present. No thyromegaly present.  Cardiovascular: Normal rate, regular rhythm, normal heart sounds and intact distal pulses.   Pulmonary/Chest: Effort normal. No respiratory distress. She has no wheezes. She has no rales.  Patient does have some bilateral coarse breath sounds; but no active wheezing or coughing.  No acute shortness of breath noted on exam.  Abdominal: Soft. Bowel sounds are normal. She exhibits no distension and no mass. There is no tenderness. There is no rebound and no guarding.  Musculoskeletal: Normal range of motion. She exhibits no edema or tenderness.  Lymphadenopathy:    She has no cervical adenopathy.  Neurological: She is alert and oriented to person, place, and time.  Skin: Skin is warm and dry. No rash noted. No erythema.  Psychiatric: Affect normal.  Nursing note and vitals reviewed.   LABORATORY DATA:. Appointment on 07/26/2014  Component Date Value Ref Range Status  . WBC 07/26/2014 47.8* 3.9 - 10.3 10e3/uL Final  . NEUT# 07/26/2014 40.6* 1.5 - 6.5 10e3/uL Final  . HGB 07/26/2014 12.5  11.6 - 15.9 g/dL Final  . HCT 07/26/2014 38.7  34.8 - 46.6 % Final  . Platelets 07/26/2014 254  145 - 400 10e3/uL Final  . MCV 07/26/2014 93.8  79.5 - 101.0 fL Final  . MCH 07/26/2014 30.3  25.1 - 34.0 pg Final  . MCHC 07/26/2014 32.3  31.5 - 36.0 g/dL Final  . RBC 07/26/2014 4.12  3.70 - 5.45 10e6/uL Final  . RDW 07/26/2014 18.3* 11.2 - 14.5 % Final  . lymph# 07/26/2014 5.5* 0.9 - 3.3 10e3/uL Final  . MONO# 07/26/2014 1.4* 0.1 - 0.9 10e3/uL Final  . Eosinophils Absolute 07/26/2014 0.0  0.0 - 0.5 10e3/uL Final  . Basophils Absolute 07/26/2014 0.3* 0.0 - 0.1 10e3/uL Final  . NEUT%  07/26/2014 85.0* 38.4 - 76.8 % Final  . LYMPH% 07/26/2014 11.5* 14.0 - 49.7 % Final  . MONO% 07/26/2014 3.0  0.0 - 14.0 % Final  . EOS% 07/26/2014 0.0  0.0 - 7.0 % Final  . BASO% 07/26/2014 0.5  0.0 - 2.0 % Final  . Sodium 07/26/2014 140  136 - 145 mEq/L Final  . Potassium 07/26/2014 4.5  3.5 - 5.1 mEq/L Final  . Chloride 07/26/2014 103  98 - 109 mEq/L Final  . CO2 07/26/2014 26  22 - 29 mEq/L Final  . Glucose 07/26/2014 97  70 - 140 mg/dl Final  . BUN 07/26/2014 22.0  7.0 - 26.0 mg/dL Final  . Creatinine 07/26/2014 0.7  0.6 - 1.1 mg/dL Final  . Total Bilirubin 07/26/2014 0.21  0.20 - 1.20 mg/dL Final  . Alkaline Phosphatase 07/26/2014 125  40 - 150 U/L Final  . AST 07/26/2014 22  5 - 34 U/L Final  . ALT 07/26/2014 51  0 - 55 U/L Final  . Total Protein 07/26/2014 5.9* 6.4 - 8.3 g/dL Final  . Albumin 07/26/2014 3.1* 3.5 - 5.0 g/dL Final  . Calcium 07/26/2014 9.0  8.4 - 10.4 mg/dL Final  . Anion Gap 07/26/2014 11  3 - 11 mEq/L Final  . EGFR 07/26/2014 >90  >90 ml/min/1.73 m2 Final   eGFR is calculated using the CKD-EPI Creatinine Equation (2009)     RADIOGRAPHIC STUDIES: No results found.  ASSESSMENT/PLAN:    Brain metastases Patient has been diagnosed with brain metastasis.  She has been taken dexamethasone 4 mg twice daily as previously directed.  Patient is requesting a refill of the dexamethasone today.  Candida infection, oral Patient was noted to have some thrush a few weeks ago.  Patient has completed Diflucan 1 week.  It does appear that all thrush symptoms have since resolved; the patient has no further oral lesions.  Encouraged patient to continue with baking soda/salt swish and spit.  Neoplasm related pain Patient continues to complain of some generalized pain; and specific pain to her chest wall when she coughs.  She denies any specific chest pain, chest pressure, worsening shortness of breath or pain with inspiration.  Patient states she has been taking Percocet 5/325  mg tablet one every 6 hours with only minimal effectiveness.  Advised patient that she could increase the Percocet tablets to 2 tablets every 4-6 hours and see if that helps.  Small cell carcinoma of lung Patient initiated cycle 1 of her carboplatin/etoposide chemotherapy regimen on 07/05/2014.  Due to transportation issues-patient was only able to receive cycle 1, day 1 and 2 only of her last chemotherapy cycle.  She also missed her scheduled Neulasta injection.  She was however, given a Neulasta injection on 07/12/2014.  Reviewed all of patient's lab results with her in detail today.  Blood counts remained stable; with an elevated  CBC secondary to Neulasta injection.  Patient will proceed today with cycle 2, day 1 of her carboplatin/etoposide chemotherapy regimen.  She has plans to return tomorrow for cycle 2, day 2 of etoposide only portion of her chemotherapy.  Patient will need to obtain a restaging CT with contrast of the chest/abdomen/pelvis on 08/11/2014.  Patient will need to return for cycle 3 of the same chemotherapy regimen on 08/16/2014.  Patient has already met with Dr. Sondra Come radiation oncologist for further evaluation; and possible radiation therapy post chemotherapy.  Patient was also advised that she needed to have some Claritin on hand to take when she receives her next Neulasta injection.   Patient stated understanding of all instructions; and was in agreement with this plan of care. The patient knows to call the clinic with any problems, questions or concerns.   This was a shared visit with Dr. Julien Nordmann today.    Total time spent with patient was 25 minutes;  with greater than 75 percent of that time spent in face to face counseling regarding her symptoms, and coordination  of care and follow up.  Disclaimer: This note was dictated with voice recognition software. Similar sounding words can inadvertently be transcribed and may not be corrected upon review.   Drue Second,  NP 07/26/2014   ADDENDUM: Hematology/Oncology Attending: I had a face to face encounter with the patient today. I recommended her care plan. This is a very pleasant 58 years old African-American female recently diagnosed with extensive stage small cell lung cancer currently undergoing systemic chemotherapy with carboplatin and etoposide status post 1 cycle. The patient tolerated the first cycle of her treatment fairly well with no significant adverse effect except for mild fatigue. She is here today to start cycle #2 of her treatment. I recommended for the patient to proceed with her treatment as planned. She would come back for follow-up visit in 3 weeks after repeating CT scan of the chest, abdomen and pelvis for restaging of her disease. She was advised to call immediately if she has any concerning symptoms in the interval.  Disclaimer: This note was dictated with voice recognition software. Similar sounding words can inadvertently be transcribed and may be missed upon review. Eilleen Kempf., MD 07/26/2014

## 2014-07-27 ENCOUNTER — Ambulatory Visit (HOSPITAL_BASED_OUTPATIENT_CLINIC_OR_DEPARTMENT_OTHER): Payer: Self-pay

## 2014-07-27 DIAGNOSIS — Z5111 Encounter for antineoplastic chemotherapy: Secondary | ICD-10-CM

## 2014-07-27 DIAGNOSIS — C3491 Malignant neoplasm of unspecified part of right bronchus or lung: Secondary | ICD-10-CM

## 2014-07-27 DIAGNOSIS — C7931 Secondary malignant neoplasm of brain: Secondary | ICD-10-CM

## 2014-07-27 MED ORDER — DEXAMETHASONE SODIUM PHOSPHATE 10 MG/ML IJ SOLN
10.0000 mg | Freq: Once | INTRAMUSCULAR | Status: AC
  Administered 2014-07-27: 10 mg via INTRAVENOUS

## 2014-07-27 MED ORDER — SODIUM CHLORIDE 0.9 % IV SOLN
Freq: Once | INTRAVENOUS | Status: AC
  Administered 2014-07-27: 11:00:00 via INTRAVENOUS

## 2014-07-27 MED ORDER — ONDANSETRON 8 MG/NS 50 ML IVPB
INTRAVENOUS | Status: AC
  Filled 2014-07-27: qty 8

## 2014-07-27 MED ORDER — DEXAMETHASONE SODIUM PHOSPHATE 10 MG/ML IJ SOLN
INTRAMUSCULAR | Status: AC
  Filled 2014-07-27: qty 1

## 2014-07-27 MED ORDER — ONDANSETRON 8 MG/50ML IVPB (CHCC)
8.0000 mg | Freq: Once | INTRAVENOUS | Status: AC
  Administered 2014-07-27: 8 mg via INTRAVENOUS

## 2014-07-27 MED ORDER — SODIUM CHLORIDE 0.9 % IV SOLN
120.0000 mg/m2 | Freq: Once | INTRAVENOUS | Status: AC
  Administered 2014-07-27: 210 mg via INTRAVENOUS
  Filled 2014-07-27: qty 10.5

## 2014-07-27 NOTE — Patient Instructions (Signed)
[place] Discharge Instructions for Patients Receiving Chemotherapy  Today you received the following chemotherapy agents [id number] To help prevent nausea and vomiting after your treatment, we encourage you to take your nausea medication as prescribed.   If you develop nausea and vomiting that is not controlled by your nausea medication, call the clinic.   BELOW ARE SYMPTOMS THAT SHOULD BE REPORTED IMMEDIATELY:  *FEVER GREATER THAN 100.5 F  *CHILLS WITH OR WITHOUT FEVER  NAUSEA AND VOMITING THAT IS NOT CONTROLLED WITH YOUR NAUSEA MEDICATION  *UNUSUAL SHORTNESS OF BREATH  *UNUSUAL BRUISING OR BLEEDING  TENDERNESS IN MOUTH AND THROAT WITH OR WITHOUT PRESENCE OF ULCERS  *URINARY PROBLEMS  *BOWEL PROBLEMS  UNUSUAL RASH Items with * indicate a potential emergency and should be followed up as soon as possible.  Feel free to call the clinic you have any questions or concerns. The clinic phone number is [phone].

## 2014-07-28 ENCOUNTER — Ambulatory Visit (HOSPITAL_BASED_OUTPATIENT_CLINIC_OR_DEPARTMENT_OTHER): Payer: Self-pay

## 2014-07-28 DIAGNOSIS — C7931 Secondary malignant neoplasm of brain: Secondary | ICD-10-CM

## 2014-07-28 DIAGNOSIS — C3491 Malignant neoplasm of unspecified part of right bronchus or lung: Secondary | ICD-10-CM

## 2014-07-28 DIAGNOSIS — Z5111 Encounter for antineoplastic chemotherapy: Secondary | ICD-10-CM

## 2014-07-28 MED ORDER — ONDANSETRON 8 MG/50ML IVPB (CHCC)
8.0000 mg | Freq: Once | INTRAVENOUS | Status: AC
  Administered 2014-07-28: 8 mg via INTRAVENOUS

## 2014-07-28 MED ORDER — ONDANSETRON 8 MG/NS 50 ML IVPB
INTRAVENOUS | Status: AC
  Filled 2014-07-28: qty 8

## 2014-07-28 MED ORDER — DEXAMETHASONE SODIUM PHOSPHATE 10 MG/ML IJ SOLN
10.0000 mg | Freq: Once | INTRAMUSCULAR | Status: AC
  Administered 2014-07-28: 10 mg via INTRAVENOUS

## 2014-07-28 MED ORDER — DEXAMETHASONE SODIUM PHOSPHATE 10 MG/ML IJ SOLN
INTRAMUSCULAR | Status: AC
  Filled 2014-07-28: qty 1

## 2014-07-28 MED ORDER — SODIUM CHLORIDE 0.9 % IV SOLN
Freq: Once | INTRAVENOUS | Status: AC
  Administered 2014-07-28: 11:00:00 via INTRAVENOUS

## 2014-07-28 MED ORDER — SODIUM CHLORIDE 0.9 % IV SOLN
120.0000 mg/m2 | Freq: Once | INTRAVENOUS | Status: AC
  Administered 2014-07-28: 210 mg via INTRAVENOUS
  Filled 2014-07-28: qty 10.5

## 2014-07-28 NOTE — Patient Instructions (Signed)
[facility] Discharge Instructions for Patients Receiving Chemotherapy  Today you received the following chemotherapy agents:  Etoposide  To help prevent nausea and vomiting after your treatment, we encourage you to take your nausea medication as ordered per MD.   If you develop nausea and vomiting that is not controlled by your nausea medication, call the clinic.   BELOW ARE SYMPTOMS THAT SHOULD BE REPORTED IMMEDIATELY:  *FEVER GREATER THAN 100.5 F  *CHILLS WITH OR WITHOUT FEVER  NAUSEA AND VOMITING THAT IS NOT CONTROLLED WITH YOUR NAUSEA MEDICATION  *UNUSUAL SHORTNESS OF BREATH  *UNUSUAL BRUISING OR BLEEDING  TENDERNESS IN MOUTH AND THROAT WITH OR WITHOUT PRESENCE OF ULCERS  *URINARY PROBLEMS  *BOWEL PROBLEMS  UNUSUAL RASH Items with * indicate a potential emergency and should be followed up as soon as possible.  Feel free to call the clinic you have any questions or concerns. The clinic phone number is [phone].

## 2014-07-29 ENCOUNTER — Ambulatory Visit (HOSPITAL_BASED_OUTPATIENT_CLINIC_OR_DEPARTMENT_OTHER): Payer: Self-pay | Admitting: Nurse Practitioner

## 2014-07-29 ENCOUNTER — Encounter (HOSPITAL_COMMUNITY): Payer: Self-pay | Admitting: *Deleted

## 2014-07-29 ENCOUNTER — Ambulatory Visit: Payer: Self-pay

## 2014-07-29 ENCOUNTER — Ambulatory Visit (HOSPITAL_COMMUNITY)
Admission: RE | Admit: 2014-07-29 | Discharge: 2014-07-29 | Disposition: A | Discharge status: Home / Self Care | Payer: Medicaid Other | Source: Ambulatory Visit | Attending: Nurse Practitioner | Admitting: Nurse Practitioner

## 2014-07-29 ENCOUNTER — Inpatient Hospital Stay (HOSPITAL_COMMUNITY)
Admission: AD | Admit: 2014-07-29 | Discharge: 2014-07-31 | DRG: 871 | Disposition: A | Discharge status: Home / Self Care | Payer: Medicaid Other | Source: Ambulatory Visit | Attending: Internal Medicine | Admitting: Internal Medicine

## 2014-07-29 ENCOUNTER — Other Ambulatory Visit: Payer: Self-pay | Admitting: Nurse Practitioner

## 2014-07-29 DIAGNOSIS — C3491 Malignant neoplasm of unspecified part of right bronchus or lung: Secondary | ICD-10-CM

## 2014-07-29 DIAGNOSIS — C7931 Secondary malignant neoplasm of brain: Secondary | ICD-10-CM

## 2014-07-29 DIAGNOSIS — J189 Pneumonia, unspecified organism: Secondary | ICD-10-CM | POA: Diagnosis present

## 2014-07-29 DIAGNOSIS — F329 Major depressive disorder, single episode, unspecified: Secondary | ICD-10-CM | POA: Diagnosis present

## 2014-07-29 DIAGNOSIS — A419 Sepsis, unspecified organism: Secondary | ICD-10-CM | POA: Diagnosis present

## 2014-07-29 DIAGNOSIS — F1290 Cannabis use, unspecified, uncomplicated: Secondary | ICD-10-CM | POA: Diagnosis present

## 2014-07-29 DIAGNOSIS — C3490 Malignant neoplasm of unspecified part of unspecified bronchus or lung: Secondary | ICD-10-CM

## 2014-07-29 DIAGNOSIS — R599 Enlarged lymph nodes, unspecified: Secondary | ICD-10-CM

## 2014-07-29 DIAGNOSIS — Z5111 Encounter for antineoplastic chemotherapy: Secondary | ICD-10-CM | POA: Diagnosis not present

## 2014-07-29 DIAGNOSIS — J9600 Acute respiratory failure, unspecified whether with hypoxia or hypercapnia: Secondary | ICD-10-CM | POA: Diagnosis present

## 2014-07-29 DIAGNOSIS — Z87891 Personal history of nicotine dependence: Secondary | ICD-10-CM

## 2014-07-29 DIAGNOSIS — D638 Anemia in other chronic diseases classified elsewhere: Secondary | ICD-10-CM | POA: Diagnosis present

## 2014-07-29 DIAGNOSIS — Z79899 Other long term (current) drug therapy: Secondary | ICD-10-CM

## 2014-07-29 DIAGNOSIS — R509 Fever, unspecified: Secondary | ICD-10-CM | POA: Diagnosis present

## 2014-07-29 DIAGNOSIS — Z85118 Personal history of other malignant neoplasm of bronchus and lung: Secondary | ICD-10-CM

## 2014-07-29 LAB — CBC WITH DIFFERENTIAL/PLATELET
BASO%: 0 % (ref 0.0–2.0)
Basophils Absolute: 0 10*3/uL (ref 0.0–0.1)
EOS%: 0 % (ref 0.0–7.0)
Eosinophils Absolute: 0 10*3/uL (ref 0.0–0.5)
HEMATOCRIT: 36.4 % (ref 34.8–46.6)
HGB: 12 g/dL (ref 11.6–15.9)
LYMPH%: 7.6 % — AB (ref 14.0–49.7)
MCH: 30.5 pg (ref 25.1–34.0)
MCHC: 33 g/dL (ref 31.5–36.0)
MCV: 92.6 fL (ref 79.5–101.0)
MONO#: 0.1 10*3/uL (ref 0.1–0.9)
MONO%: 0.2 % (ref 0.0–14.0)
NEUT#: 25.3 10*3/uL — ABNORMAL HIGH (ref 1.5–6.5)
NEUT%: 92.2 % — ABNORMAL HIGH (ref 38.4–76.8)
PLATELETS: 224 10*3/uL (ref 145–400)
RBC: 3.93 10*6/uL (ref 3.70–5.45)
RDW: 19.6 % — ABNORMAL HIGH (ref 11.2–14.5)
WBC: 27.5 10*3/uL — ABNORMAL HIGH (ref 3.9–10.3)
lymph#: 2.1 10*3/uL (ref 0.9–3.3)

## 2014-07-29 LAB — COMPREHENSIVE METABOLIC PANEL (CC13)
ALT: 51 U/L (ref 0–55)
AST: 20 U/L (ref 5–34)
Albumin: 2.8 g/dL — ABNORMAL LOW (ref 3.5–5.0)
Alkaline Phosphatase: 108 U/L (ref 40–150)
Anion Gap: 12 mEq/L — ABNORMAL HIGH (ref 3–11)
BILIRUBIN TOTAL: 0.36 mg/dL (ref 0.20–1.20)
BUN: 13.4 mg/dL (ref 7.0–26.0)
CO2: 25 mEq/L (ref 22–29)
Calcium: 9.1 mg/dL (ref 8.4–10.4)
Chloride: 98 mEq/L (ref 98–109)
Creatinine: 0.6 mg/dL (ref 0.6–1.1)
EGFR: >90 mL/min/{1.73_m2} (ref >90)
Glucose: 114 mg/dl (ref 70–140)
Potassium: 4.5 mEq/L (ref 3.5–5.1)
Sodium: 135 mEq/L — ABNORMAL LOW (ref 136–145)
Total Protein: 6 g/dL — ABNORMAL LOW (ref 6.4–8.3)

## 2014-07-29 MED ORDER — HYDROMORPHONE HCL 1 MG/ML IJ SOLN
1.0000 mg | INTRAMUSCULAR | Status: DC | PRN
  Administered 2014-07-29 – 2014-07-31 (×10): 1 mg via INTRAVENOUS
  Filled 2014-07-29 (×11): qty 1

## 2014-07-29 MED ORDER — ACETAMINOPHEN 325 MG PO TABS
650.0000 mg | ORAL_TABLET | Freq: Once | ORAL | Status: AC
  Administered 2014-07-29: 650 mg via ORAL

## 2014-07-29 MED ORDER — VANCOMYCIN HCL IN DEXTROSE 750-5 MG/150ML-% IV SOLN
750.0000 mg | Freq: Two times a day (BID) | INTRAVENOUS | Status: DC
  Filled 2014-07-29: qty 150

## 2014-07-29 MED ORDER — VANCOMYCIN HCL 10 G IV SOLR
1250.0000 mg | Freq: Once | INTRAVENOUS | Status: DC
  Filled 2014-07-29: qty 1250

## 2014-07-29 MED ORDER — GUAIFENESIN-DM 100-10 MG/5ML PO SYRP: 5.0000 mL | ORAL_SOLUTION | ORAL | Status: DC | PRN

## 2014-07-29 MED ORDER — VANCOMYCIN HCL 10 G IV SOLR
1250.0000 mg | Freq: Once | INTRAVENOUS | Status: AC
  Administered 2014-07-29: 1250 mg via INTRAVENOUS
  Filled 2014-07-29: qty 1250

## 2014-07-29 MED ORDER — SODIUM CHLORIDE 0.9 % IV SOLN
INTRAVENOUS | Status: DC
  Administered 2014-07-29: 18:00:00 via INTRAVENOUS

## 2014-07-29 MED ORDER — ALBUTEROL SULFATE (2.5 MG/3ML) 0.083% IN NEBU
2.5000 mg | INHALATION_SOLUTION | Freq: Once | RESPIRATORY_TRACT | Status: AC
  Administered 2014-07-29: 2.5 mg via RESPIRATORY_TRACT
  Filled 2014-07-29: qty 3

## 2014-07-29 MED ORDER — SODIUM CHLORIDE 0.9 % IJ SOLN: 3.0000 mL | Freq: Two times a day (BID) | INTRAMUSCULAR | Status: DC

## 2014-07-29 MED ORDER — IPRATROPIUM-ALBUTEROL 0.5-2.5 (3) MG/3ML IN SOLN
3.0000 mL | Freq: Four times a day (QID) | RESPIRATORY_TRACT | Status: DC | PRN
  Administered 2014-07-30: 3 mL via RESPIRATORY_TRACT

## 2014-07-29 MED ORDER — ONDANSETRON HCL 4 MG PO TABS: 4.0000 mg | ORAL_TABLET | Freq: Four times a day (QID) | ORAL | Status: DC | PRN

## 2014-07-29 MED ORDER — PEGFILGRASTIM INJECTION 6 MG/0.6ML ~~LOC~~
6.0000 mg | PREFILLED_SYRINGE | Freq: Once | SUBCUTANEOUS | Status: DC
  Filled 2014-07-29: qty 0.6

## 2014-07-29 MED ORDER — ENOXAPARIN SODIUM 40 MG/0.4ML ~~LOC~~ SOLN
40.0000 mg | SUBCUTANEOUS | Status: DC
  Administered 2014-07-30: 40 mg via SUBCUTANEOUS
  Filled 2014-07-29 (×3): qty 0.4

## 2014-07-29 MED ORDER — PROCHLORPERAZINE MALEATE 10 MG PO TABS
10.0000 mg | ORAL_TABLET | Freq: Four times a day (QID) | ORAL | Status: DC | PRN
  Filled 2014-07-29: qty 1

## 2014-07-29 MED ORDER — ACETAMINOPHEN 325 MG PO TABS
ORAL_TABLET | ORAL | Status: AC
  Filled 2014-07-29: qty 2

## 2014-07-29 MED ORDER — ALBUTEROL SULFATE (2.5 MG/3ML) 0.083% IN NEBU
2.5000 mg | INHALATION_SOLUTION | RESPIRATORY_TRACT | Status: DC | PRN
Start: 2014-07-29 — End: 2014-07-29

## 2014-07-29 MED ORDER — ALBUTEROL SULFATE (2.5 MG/3ML) 0.083% IN NEBU
2.5000 mg | INHALATION_SOLUTION | Freq: Four times a day (QID) | RESPIRATORY_TRACT | Status: DC
  Administered 2014-07-29 (×2): 2.5 mg via RESPIRATORY_TRACT
  Filled 2014-07-29: qty 3

## 2014-07-29 MED ORDER — ALBUTEROL SULFATE (2.5 MG/3ML) 0.083% IN NEBU
2.5000 mg | INHALATION_SOLUTION | RESPIRATORY_TRACT | Status: DC | PRN
  Filled 2014-07-29: qty 3

## 2014-07-29 MED ORDER — VANCOMYCIN HCL IN DEXTROSE 750-5 MG/150ML-% IV SOLN
750.0000 mg | Freq: Two times a day (BID) | INTRAVENOUS | Status: DC
  Administered 2014-07-30 (×2): 750 mg via INTRAVENOUS
  Filled 2014-07-29 (×3): qty 150

## 2014-07-29 MED ORDER — GUAIFENESIN ER 600 MG PO TB12
600.0000 mg | ORAL_TABLET | Freq: Two times a day (BID) | ORAL | Status: DC
  Administered 2014-07-30 – 2014-07-31 (×3): 600 mg via ORAL
  Filled 2014-07-29 (×5): qty 1

## 2014-07-29 MED ORDER — ONDANSETRON HCL 4 MG/2ML IJ SOLN: 4.0000 mg | Freq: Four times a day (QID) | INTRAMUSCULAR | Status: DC | PRN

## 2014-07-29 MED ORDER — PIPERACILLIN-TAZOBACTAM 3.375 G IVPB
3.3750 g | Freq: Three times a day (TID) | INTRAVENOUS | Status: DC
  Administered 2014-07-29 – 2014-07-30 (×5): 3.375 g via INTRAVENOUS
  Filled 2014-07-29 (×7): qty 50

## 2014-07-29 MED ORDER — IPRATROPIUM-ALBUTEROL 0.5-2.5 (3) MG/3ML IN SOLN
3.0000 mL | Freq: Four times a day (QID) | RESPIRATORY_TRACT | Status: DC
  Administered 2014-07-30 (×2): 3 mL via RESPIRATORY_TRACT
  Filled 2014-07-29 (×3): qty 3

## 2014-07-29 MED ORDER — DEXAMETHASONE 4 MG PO TABS
4.0000 mg | ORAL_TABLET | Freq: Two times a day (BID) | ORAL | Status: DC
  Administered 2014-07-30 – 2014-07-31 (×3): 4 mg via ORAL
  Filled 2014-07-29 (×5): qty 1

## 2014-07-29 NOTE — Progress Notes (Signed)
ANTIBIOTIC CONSULT NOTE - INITIAL  Pharmacy Consult for Vancomycin & Zosyn Indication: rule out sepsis  No Known Allergies  Patient Measurements: Height: 5\' 4"  (162.6 cm) Weight: 148 lb 2.4 oz (67.2 kg) IBW/kg (Calculated) : 54.7  Vital Signs: Temp: 97.9 F (36.6 C) (12/17 1455) Temp Source: Oral (12/17 1455) BP: 96/54 mmHg (12/17 1455) Pulse Rate: 110 (12/17 1455) Intake/Output from previous day:   Intake/Output from this shift:    Labs:  Recent Labs  07/29/14 1238  WBC 27.5*  HGB 12.0  PLT 224  CREATININE 0.6   Estimated Creatinine Clearance: 74 mL/min (by C-G formula based on Cr of 0.6). No results for input(s): VANCOTROUGH, VANCOPEAK, VANCORANDOM, GENTTROUGH, GENTPEAK, GENTRANDOM, TOBRATROUGH, TOBRAPEAK, TOBRARND, AMIKACINPEAK, AMIKACINTROU, AMIKACIN in the last 72 hours.   Microbiology: Recent Results (from the past 720 hour(s))  TECHNOLOGIST REVIEW     Status: None   Collection Time: 07/02/14 11:43 AM  Result Value Ref Range Status   Technologist Review 2% myelocytes  Final   Medical History: Past Medical History  Diagnosis Date  . Brain metastases 06/21/2014   Medications:  Scheduled:  . albuterol  2.5 mg Nebulization Q6H  . dexamethasone  4 mg Oral BID  . enoxaparin (LOVENOX) injection  40 mg Subcutaneous Q24H  . guaiFENesin  600 mg Oral BID  . piperacillin-tazobactam (ZOSYN)  IV  3.375 g Intravenous Q8H  . sodium chloride  3 mL Intravenous Q12H  . vancomycin  1,250 mg Intravenous Once  . [START ON 07/30/2014] vancomycin  750 mg Intravenous Q12H   Anti-infectives    Start     Dose/Rate Route Frequency Ordered Stop   07/30/14 0400  vancomycin (VANCOCIN) IVPB 750 mg/150 ml premix     750 mg150 mL/hr over 60 Minutes Intravenous Every 12 hours 07/29/14 1554     07/29/14 1700  vancomycin (VANCOCIN) 1,250 mg in sodium chloride 0.9 % 250 mL IVPB     1,250 mg166.7 mL/hr over 90 Minutes Intravenous  Once 07/29/14 1554     07/29/14 1600   piperacillin-tazobactam (ZOSYN) IVPB 3.375 g     3.375 g12.5 mL/hr over 240 Minutes Intravenous Every 8 hours 07/29/14 1548       Assessment: Mariah Park admitted from [facility], planned Neulasta today 12/17, s/p chemo Carbo/Etop Cycle 2 completed 12/16 for Small cell Lung Ca/brain mets. Complaint of chest pain, SHOB, temp 101.5 at [facility] [place] held. CXray: post-obstructive PNA. Pharmacy requested to dose Zosyn and Vancomycin, rule out sepsis  Goal of Therapy:  Vancomycin trough level 15-20 mcg/ml  Plan:   Vancomycin 1250mg  x1, then 750mg  q12  Zosyn 3.375gm q8-4 hr infusion  Follow renal function, clinical course, trough level if needed  Minda Ditto PharmD Pager [phone] 07/29/2014, 3:59 PM

## 2014-07-29 NOTE — Progress Notes (Signed)
Pt complains of having chest pain.  Pt SOB.  States that she took a Beyer Aspirin this morning.  Vitals taken 101.5 oral, BP 119/83, heart rate 122.  Pt states she just wants to go home and take her pain medicine.  Selena Lesser, NP made aware of pt concerns.  Cyndee states she will see pt.  Neulasta injection held at this time until pt can be evaluated by NP.

## 2014-07-29 NOTE — H&P (Signed)
[facility] History and Physical  ARILYN BRIERLEY AOZ:[id number] DOB: [birth year] DOA: 07/29/2014  Referring physician: ED physician PCP: No PCP Per Patient   Chief Complaint: fever, dyspnea   HPI:  Pt is 58 yo female with lung cancer, follows with Dr. Julien Nordmann (on carboplantin ,etoposide , last chemo 12/15), came in to get neulasta at he cancer center and found to have PNA on CXR. VS in the office notable for BP 110/83, HR 120 's , on 2 L of oxygen, T 101.5 F. Pt is rather poor historian and somewhat tired upon arrival to [facility] so unable to provide much details. She explains she has been tired over the past week, has noted exertional dyspnea that has progressed to intermittent dyspnea at rest, associated with productive cough of yellow sputum, subjective fevers, chills, malaise, chest discomfort with coughing spells. Pt explains she is depressed about her diagnosis and does not feel like talking about it for now.   Upon arrival to the unit 5 E at [facility], VS notable for T 101.5 F, HR 110 bpm, BP 95/54 mmHg, oxygen saturations 93% on 2 L [place]. [facility] called to admit for further management of sepsis secondary to PNA.  Assessment and Plan: Active Problems: Sepsis  - criteria met on admission with VS noted above and with WBC 27K, source PNA - will place on broad spectrum ABX Vancomycin and Zosyn - obtain blood culture - will also check UA, urine culture  - provide IVF, bronchodilators scheduled and as needed - repeat CBC in AM to follow ABC trend  Acute respiratory failure - secondary to PNA, will treat as HCAP given immunocompromised state - provide oxygen via [place], BD's as noted above  Leukocytosis - secondary to the above, also from decadron effect - WBC actually down from recent value of 40 K - CBC in AM  Radiological Exams on Admission: Dg Chest 2 View  07/29/2014  Right hilar mass with postobstructive pneumonitis extending in the right middle lobe and right lower lobe. New opacity in the  left mid lung possibly in the left upper lobe inferiorly consistent with pneumonia or possibly metastatic involvement with surrounding pneumonia. Right paratracheal adenopathy.     Code Status: Full Family Communication: Pt at bedside Disposition Plan: Admit for further evaluation     Review of Systems:  Constitutional: Negative for diaphoresis.  HENT: Negative for hearing loss, ear pain, nosebleeds, congestion, sore throat, neck pain, tinnitus and ear discharge.   Eyes: Negative for blurred vision, double vision, photophobia, pain, discharge and redness.  Respiratory: Negative for wheezing and stridor.   Cardiovascular: Negative for claudication and leg swelling.  Gastrointestinal: Negative for heartburn, constipation, blood in stool and melena.  Genitourinary: Negative for dysuria, urgency, frequency, hematuria and flank pain.  Musculoskeletal: Negative for myalgias, back pain, joint pain and falls.  Skin: Negative for itching and rash.  Neurological: Negative for dizziness.  Endo/Heme/Allergies: Negative for environmental allergies and polydipsia. Does not bruise/bleed easily.  Psychiatric/Behavioral: Negative for suicidal ideas    Past Medical History  Diagnosis Date  . Brain metastases 06/21/2014    No past surgical history on file.  Social History:  reports that she quit smoking about 8 weeks ago. Her smoking use included Cigarettes. She smoked 0.50 packs per day. She does not have any smokeless tobacco history on file. She reports that she drinks alcohol. She reports that she uses illicit drugs (Marijuana) about 14 times per week.  No Known Allergies  Family History  Problem Relation Age  of Onset  . Stroke Mother   . Cancer Sister     Prior to Admission medications   Medication Sig Start Date End Date Taking? Authorizing Provider  dexamethasone (DECADRON) 4 MG tablet Take 1 tablet (4 mg total) by mouth 2 (two) times daily. 07/26/14   Drue Second, NP  Multiple  Vitamins-Minerals (MULTIVITAMIN WITH MINERALS) tablet Take 1 tablet by mouth daily.    Historical Provider, MD  oxyCODONE-acetaminophen (PERCOCET) 5-325 MG per tablet Take 1 tablet by mouth every 6 (six) hours as needed. 07/21/14   Curt Bears, MD  prochlorperazine (COMPAZINE) 10 MG tablet Take 1 tablet (10 mg total) by mouth every 6 (six) hours as needed for nausea or vomiting. 07/12/14   Carlton Adam, PA-C    Physical Exam: There were no vitals filed for this visit.  Physical Exam  Constitutional: Appears well-developed and well-nourished. No distress.  HENT: Normocephalic. External right and left ear normal. Dry MM Eyes: Conjunctivae and EOM are normal. PERRLA, no scleral icterus.  Neck: Normal ROM. Neck supple. No JVD. No tracheal deviation. No thyromegaly.  CVS: RRR, S1/S2 +, no murmurs, no gallops, no carotid bruit.  Pulmonary: Course breath sounds bilaterally with rhonchi at bases  Abdominal: Soft. BS +,  no distension, tenderness, rebound or guarding.  Musculoskeletal: Normal range of motion.  Lymphadenopathy: No lymphadenopathy noted, cervical, inguinal. Neuro: Alert. Normal reflexes, muscle tone coordination. No cranial nerve deficit. Skin: Skin is warm and dry. No rash noted. Not diaphoretic. No erythema. No pallor.  Psychiatric: Normal mood and affect.   Labs on Admission:  Basic Metabolic Panel:  Recent Labs Lab 07/26/14 0838 07/29/14 1238  NA 140 135*  K 4.5 4.5  CO2 26 25  GLUCOSE 97 114  BUN 22.0 13.4  CREATININE 0.7 0.6  CALCIUM 9.0 9.1   Liver Function Tests:  Recent Labs Lab 07/26/14 0838 07/29/14 1238  AST 22 20  ALT 51 51  ALKPHOS 125 108  BILITOT 0.21 0.36  PROT 5.9* 6.0*  ALBUMIN 3.1* 2.8*   CBC:  Recent Labs Lab 07/26/14 0837 07/29/14 1238  WBC 47.8* 27.5*  NEUTROABS 40.6* 25.3*  HGB 12.5 12.0  HCT 38.7 36.4  MCV 93.8 92.6  PLT 254 224   EKG: Normal sinus rhythm, no ST/T wave changes  Faye Ramsay, MD  [facility] Pager [phone]  If 7PM-7AM, please contact night-coverage [email or web address] Password [email or web address] 07/29/2014, 3:24 PM

## 2014-07-29 NOTE — Progress Notes (Signed)
Patient ID: Mariah Park, female   DOB: 03-31-1956, 58 y.o.   MRN: [id number]  Lung cancer, carboplantin ,etoposide , last chemo 12/15, came in to get neulasta,found to have PNA ' BP 11/83, hr 120 's , on 2 L of oxygen Fever 101.5, sob  cxr shows PNA ,Progression of cancer Pt of Dr Inda Merlin

## 2014-07-29 NOTE — Progress Notes (Signed)
Pt refuses to answer admission nursing history. Yelling at admitting nurse and saying "It is same thing over and over. I do not want to answer this"  T. Doroteo Bradford BSN, RN-BC Admissions RN  07/29/2014 7:13 PM

## 2014-07-30 DIAGNOSIS — J189 Pneumonia, unspecified organism: Secondary | ICD-10-CM

## 2014-07-30 DIAGNOSIS — A419 Sepsis, unspecified organism: Principal | ICD-10-CM

## 2014-07-30 DIAGNOSIS — C3491 Malignant neoplasm of unspecified part of right bronchus or lung: Secondary | ICD-10-CM

## 2014-07-30 LAB — BASIC METABOLIC PANEL
Anion gap: 13 (ref 5–15)
BUN: 11 mg/dL (ref 6–23)
CALCIUM: 8.9 mg/dL (ref 8.4–10.5)
CO2: 26 mEq/L (ref 19–32)
Chloride: 99 mEq/L (ref 96–112)
Creatinine, Ser: 0.45 mg/dL — ABNORMAL LOW (ref 0.50–1.10)
Glucose, Bld: 100 mg/dL — ABNORMAL HIGH (ref 70–99)
POTASSIUM: 4.2 meq/L (ref 3.7–5.3)
SODIUM: 138 meq/L (ref 137–147)

## 2014-07-30 LAB — URINALYSIS, ROUTINE W REFLEX MICROSCOPIC
BILIRUBIN URINE: NEGATIVE
Glucose, UA: NEGATIVE mg/dL
Hgb urine dipstick: NEGATIVE
Ketones, ur: NEGATIVE mg/dL
LEUKOCYTES UA: NEGATIVE
NITRITE: NEGATIVE
PH: 6.5 (ref 5.0–8.0)
Protein, ur: 30 mg/dL — AB
Specific Gravity, Urine: 1.022 (ref 1.005–1.030)
UROBILINOGEN UA: 0.2 mg/dL (ref 0.0–1.0)

## 2014-07-30 LAB — CBC
HCT: 32 % — ABNORMAL LOW (ref 36.0–46.0)
Hemoglobin: 10.6 g/dL — ABNORMAL LOW (ref 12.0–15.0)
MCH: 31.1 pg (ref 26.0–34.0)
MCHC: 33.1 g/dL (ref 30.0–36.0)
MCV: 93.8 fL (ref 78.0–100.0)
PLATELETS: 188 10*3/uL (ref 150–400)
RBC: 3.41 MIL/uL — AB (ref 3.87–5.11)
RDW: 19.7 % — AB (ref 11.5–15.5)
WBC: 18.9 10*3/uL — ABNORMAL HIGH (ref 4.0–10.5)

## 2014-07-30 LAB — URINE MICROSCOPIC-ADD ON

## 2014-07-30 MED ORDER — SODIUM CHLORIDE 0.9 % IV BOLUS (SEPSIS)
500.0000 mL | Freq: Once | INTRAVENOUS | Status: AC
  Administered 2014-07-30: 500 mL via INTRAVENOUS

## 2014-07-30 MED ORDER — ACETAMINOPHEN 325 MG PO TABS
650.0000 mg | ORAL_TABLET | ORAL | Status: DC | PRN
  Administered 2014-07-30: 650 mg via ORAL

## 2014-07-30 MED ORDER — IPRATROPIUM-ALBUTEROL 0.5-2.5 (3) MG/3ML IN SOLN
3.0000 mL | Freq: Four times a day (QID) | RESPIRATORY_TRACT | Status: DC
  Administered 2014-07-31: 3 mL via RESPIRATORY_TRACT
  Filled 2014-07-30 (×2): qty 3

## 2014-07-30 MED ORDER — HYDROMORPHONE HCL 1 MG/ML IJ SOLN
0.5000 mg | Freq: Once | INTRAMUSCULAR | Status: AC
  Administered 2014-07-30: 0.5 mg via INTRAVENOUS
  Filled 2014-07-30: qty 1

## 2014-07-30 NOTE — Progress Notes (Signed)
Patient ID: Mariah Park, female   DOB: 1955/12/13, 58 y.o.   MRN: [id number]  [facility] PROGRESS NOTE  Mariah Park:[id number] DOB: 1955/10/16 DOA: 07/29/2014 PCP: No PCP Per Patient  Brief narrative:   Pt is 58 yo female with lung cancer, follows with Dr. Julien Nordmann (on carboplantin ,etoposide , last chemo 12/15), came in to get neulasta at he cancer center and found to have PNA on CXR. VS in the office notable for BP 110/83, HR 120 's , on 2 L of oxygen, T 101.5 F. Pt is rather poor historian and somewhat tired upon arrival to [facility] so unable to provide much details. She explains she has been tired over the past week, has noted exertional dyspnea that has progressed to intermittent dyspnea at rest, associated with productive cough of yellow sputum, subjective fevers, chills, malaise, chest discomfort with coughing spells. Pt explains she is depressed about her diagnosis and does not feel like talking about it for now.   Upon arrival to the unit 5 E at [facility], VS notable for T 101.5 F, HR 110 bpm, BP 95/54 mmHg, oxygen saturations 93% on 2 L [place]. TR[facility] called to admit for further management of sepsis secondary to PNA.  Assessment and Plan: Active Problems: Sepsis  - criteria met on admission with VS noted above and with WBC 27K, source PNA - continue Vancomycin and Zosyn day #2 - I was unable to preform physical exam as pt asked everyone to leave the room and has refused to be examined  - blood and urine culture pending  - provide IVF, bronchodilators scheduled and as needed - repeat CBC in AM to follow WBC trend  - WBC is trending down  Acute respiratory failure - secondary to PNA, will treat as HCAP given immunocompromised state - provide oxygen via [place], BD's as noted above  - unable to perform exam as noted above  Leukocytosis - secondary to the above, also from decadron effect - WBC actually down from recent value of 40 K and still trending down  - CBC in AM Anemia of  chronic disease, lung ca - drop in Hg dilutional due to IVF pt has received - no signs of active bleeding - repeat CBC in AM  DVT Prophylaxis: Lovenox SQ  Code Status: Full.  Family Communication:  Pt refused to discuss plan this AM Disposition Plan: Home when stable.   IV access:   Peripheral IV Procedures and diagnostic studies:     Dg Chest 2 View  07/29/2014  Right hilar mass with postobstructive pneumonitis extending in the right middle lobe and right lower lobe. 2. New opacity in the left mid lung possibly in the left upper lobe inferiorly consistent with pneumonia or possibly metastatic involvement with surrounding pneumonia. 3. Right paratracheal adenopathy.  Medical Consultants:   None  Other Consultants:   None IAnti-Infectives:    Vancomycin 12/17 -->  Zosyn 12/17 -->   Leisa Lenz, MD  [facility] Pager [phone]  If 7PM-7AM, please contact night-coverage [email or web address] Password [facility] 07/30/2014, 10:53 AM   LOS: 1 day    HPI/Subjective: No acute overnight events.  Objective: Filed Vitals:   07/29/14 1932 07/29/14 2118 07/30/14 0606 07/30/14 0855  BP:  117/65 111/63   Pulse:  96 95   Temp:  98.9 F (37.2 C) 101.2 F (38.4 C)   TempSrc:  Oral Oral   Resp:  16 18   Height:      Weight:  SpO2: 97% 92% 92% 92%    Intake/Output Summary (Last 24 hours) at 07/30/14 1053 Last data filed at 07/30/14 0507  Gross per 24 hour  Intake 913.75 ml  Output    600 ml  Net 313.75 ml    Exam:  Pt refused physical exam this AM  Data Reviewed: Basic Metabolic Panel:  Recent Labs Lab 07/26/14 0838 07/29/14 1238 07/30/14 0504  NA 140 135* 138  K 4.5 4.5 4.2  CL  --   --  99  CO2 26 25 26   GLUCOSE 97 114 100*  BUN 22.0 13.4 11  CREATININE 0.7 0.6 0.45*  CALCIUM 9.0 9.1 8.9   Liver Function Tests:  Recent Labs Lab 07/26/14 0838 07/29/14 1238  AST 22 20  ALT 51 51  ALKPHOS 125 108  BILITOT 0.21 0.36  PROT 5.9* 6.0*  ALBUMIN  3.1* 2.8*   CBC:  Recent Labs Lab 07/26/14 0837 07/29/14 1238 07/30/14 0504  WBC 47.8* 27.5* 18.9*  NEUTROABS 40.6* 25.3*  --   HGB 12.5 12.0 10.6*  HCT 38.7 36.4 32.0*  MCV 93.8 92.6 93.8  PLT 254 224 188   Scheduled Meds: . dexamethasone  4 mg Oral BID  . enoxaparin (LOVENOX) injection  40 mg Subcutaneous Q24H  . guaiFENesin  600 mg Oral BID  . ipratropium-albuterol  3 mL Nebulization Q6H WA  . piperacillin-tazobactam (ZOSYN)  IV  3.375 g Intravenous Q8H  . sodium chloride  3 mL Intravenous Q12H  . vancomycin  750 mg Intravenous Q12H   Continuous Infusions: . sodium chloride 75 mL/hr at 07/29/14 1736

## 2014-07-30 NOTE — Progress Notes (Signed)
DIAGNOSIS: Extensive stage small cell lung cancer diagnosed in November 2015 presented with large right hilar mass with direct mediastinal invasion in addition to mediastinal lymphadenopathy and metastatic lesion in the subcutaneous fat of the left flank.  PRIOR THERAPY: None  CURRENT THERAPY: Systemic chemotherapy with carboplatin for AUC of 5 on day 1 and etoposide 120 MG/M2 on days 1, 2 and 3 with Neulasta support on day 4. First cycle on 07/05/2014. Status post 2 cycles.  Subjective: The patient is seen and examined today. She is feeling a little bit better. She was admitted yesterday to [facility] with pneumonia. She presented to the [facility] yesterday complaining of fever and chest x-ray showed new opacity in the left midlung and possibly in the left upper lobe inferiorly consistent with pneumonia. She was started on treatment with Zosyn and vancomycin and she is feeling much better. She denied having any current fever or chills, no nausea or vomiting.  Objective: Vital signs in last 24 hours: Temp:  [97.4 F (36.3 C)-101.2 F (38.4 C)] 97.4 F (36.3 C) (12/18 2135) Pulse Rate:  [94-95] 94 (12/18 2135) Resp:  [18-20] 20 (12/18 2135) BP: (111-129)/(63-72) 129/72 mmHg (12/18 2135) SpO2:  [92 %-99 %] 99 % (12/18 2135)  Intake/Output from previous day: 12/17 0701 - 12/18 0700 In: 913.8 [I.V.:863.8; IV Piggyback:50] Out: 600 [Urine:600] Intake/Output this shift:    General appearance: alert, cooperative, fatigued and no distress Resp: wheezes bilaterally Cardio: regular rate and rhythm, S1, S2 normal, no murmur, click, rub or gallop GI: soft, non-tender; bowel sounds normal; no masses,  no organomegaly Extremities: extremities normal, atraumatic, no cyanosis or edema  Lab Results:   Recent Labs  07/29/14 1238 07/30/14 0504  WBC 27.5* 18.9*  HGB 12.0 10.6*  HCT 36.4 32.0*  PLT 224 188   BMET  Recent Labs  07/29/14 1238 07/30/14 0504  NA 135* 138  K  4.5 4.2  CL  --  99  CO2 25 26  GLUCOSE 114 100*  BUN 13.4 11  CREATININE 0.6 0.45*  CALCIUM 9.1 8.9    Studies/Results: Dg Chest 2 View  07/29/2014   CLINICAL DATA:  History of lung carcinoma, wheezing, short of breath, fever  EXAM: CHEST  2 VIEW  COMPARISON:  Chest x-ray of 06/01/2014 and CT chest of the same date  FINDINGS: The large right hilar mass is again noted with postobstructive pneumonitis extending into the right middle lobe and right lower lobe. However there is a new opacity in the left mid lung which appears to be in the left upper lobe on the lateral view with adjacent parenchymal infiltrate as well. Although this could be inflammatory, metastatic involvement of the left lung cannot be excluded. There is evidence of right paratracheal adenopathy. No pleural effusion is seen. Heart size is stable.  IMPRESSION: 1. Right hilar mass with postobstructive pneumonitis extending in the right middle lobe and right lower lobe. 2. New opacity in the left mid lung possibly in the left upper lobe inferiorly consistent with pneumonia or possibly metastatic involvement with surrounding pneumonia. 3. Right paratracheal adenopathy.   Electronically Signed   By: Ivar Drape M.D.   On: 07/29/2014 12:18    Medications: I have reviewed the patient's current medications.   Assessment/Plan: 1) extensive stage small cell lung cancer currently undergoing systemic chemotherapy with carboplatin and etoposide status post 2 cycles. She just received cycle #2 earlier this week. She would have repeat CT scan of the chest, abdomen and pelvis  in 3 weeks for restaging of her disease. 2) left lung pneumonia: Continue current treatment with Zosyn and vancomycin. May consider switched to Levaquin before discharge. 3) disposition: The patient would like to go home tomorrow. She was very reluctant to be admitted to the hospital. If she feels good she may be able to go home on oral Levaquin and have a follow-up  appointment at an outpatient basis. Thank you for taking good care of Mariah Park, I will continue to follow up the patient with you and assist in her management on as-needed basis.  LOS: 1 day    Macai Sisneros K. 07/30/2014

## 2014-07-31 ENCOUNTER — Encounter: Payer: Self-pay | Admitting: Nurse Practitioner

## 2014-07-31 DIAGNOSIS — J189 Pneumonia, unspecified organism: Secondary | ICD-10-CM | POA: Insufficient documentation

## 2014-07-31 LAB — BASIC METABOLIC PANEL
Anion gap: 17 — ABNORMAL HIGH (ref 5–15)
BUN: 10 mg/dL (ref 6–23)
CHLORIDE: 94 meq/L — AB (ref 96–112)
CO2: 25 meq/L (ref 19–32)
Calcium: 9.8 mg/dL (ref 8.4–10.5)
Creatinine, Ser: 0.41 mg/dL — ABNORMAL LOW (ref 0.50–1.10)
GFR calc Af Amer: >90 mL/min (ref >90)
GFR calc non Af Amer: >90 mL/min (ref >90)
Glucose, Bld: 212 mg/dL — ABNORMAL HIGH (ref 70–99)
Potassium: 4.5 mEq/L (ref 3.7–5.3)
Sodium: 136 mEq/L — ABNORMAL LOW (ref 137–147)

## 2014-07-31 LAB — URINE CULTURE
COLONY COUNT: NO GROWTH
CULTURE: NO GROWTH

## 2014-07-31 LAB — CBC
HEMATOCRIT: 31.6 % — AB (ref 36.0–46.0)
Hemoglobin: 10.4 g/dL — ABNORMAL LOW (ref 12.0–15.0)
MCH: 30.7 pg (ref 26.0–34.0)
MCHC: 32.9 g/dL (ref 30.0–36.0)
MCV: 93.2 fL (ref 78.0–100.0)
Platelets: 221 10*3/uL (ref 150–400)
RBC: 3.39 MIL/uL — AB (ref 3.87–5.11)
RDW: 19.1 % — ABNORMAL HIGH (ref 11.5–15.5)
WBC: 16.5 10*3/uL — AB (ref 4.0–10.5)

## 2014-07-31 MED ORDER — OXYCODONE-ACETAMINOPHEN 5-325 MG PO TABS
1.0000 | ORAL_TABLET | Freq: Four times a day (QID) | ORAL | Status: DC | PRN
Start: 2014-07-31 — End: 2014-08-02

## 2014-07-31 MED ORDER — IPRATROPIUM-ALBUTEROL 0.5-2.5 (3) MG/3ML IN SOLN: 3.0000 mL | Freq: Four times a day (QID) | RESPIRATORY_TRACT | Status: DC | PRN

## 2014-07-31 MED ORDER — LEVOFLOXACIN 500 MG PO TABS: 500.0000 mg | ORAL_TABLET | Freq: Every day | ORAL | Status: DC

## 2014-07-31 MED ORDER — ONDANSETRON HCL 4 MG PO TABS: 4.0000 mg | ORAL_TABLET | Freq: Four times a day (QID) | ORAL | Status: DC | PRN

## 2014-07-31 NOTE — Progress Notes (Signed)
Contacted [facility] DME rep for delivery of nebulizer machine for home. Jonnie Finner RN CCM Case Mgmt phone [phone]

## 2014-07-31 NOTE — Progress Notes (Signed)
will   SYMPTOM MANAGEMENT CLINIC   HPI: Mariah Park 58 y.o. female diagnosed with lung cancer. Currently undergoing carboplatin/etoposide chemotherapy regimen.  Patient initiated cycle 2, day 1 of her carboplatin/the chemotherapy regimen on the 14th 2015. She returned to the [facility] today to receive her Neulasta injection for growth factor support. However, patient was noted to have a fever of 101.5, tachycardia at the rate of 123, increased dyspnea, and audible wheezing on exam. She also appear fairly weak as well. Patient was placed on O2 via nasal cannula at 2 L. Obtained a new CBC/differential, complete metabolic panel, blood cultures x2, and a two-view chest x-ray which revealed pneumonia and possible progression of her disease. Patient was unable to give a urine for urinalysis or culture.   HPI  CURRENT THERAPY: Upcoming Treatment Dates - LUNG SMALL CELL Carboplatin D1 / Etoposide D1-3 q21d Days with orders from any treatment category:  08/16/2014      SCHEDULING COMMUNICATION      CBC with Differential      Comprehensive metabolic panel      ondansetron (ZOFRAN) IVPB 16 mg      Dexamethasone Sodium Phosphate (DECADRON) injection 20 mg      CARBOplatin (PARAPLATIN) in sodium chloride 0.9 % 100 mL chemo infusion      etoposide (VEPESID) 210 mg in sodium chloride 0.9 % 600 mL chemo infusion      sodium chloride 0.9 % injection 10 mL      heparin lock flush 100 unit/mL      heparin lock flush 100 unit/mL      alteplase (CATHFLO ACTIVASE) injection 2 mg      sodium chloride 0.9 % injection 3 mL      Hot Pack 1 packet      0.9 %  sodium chloride infusion      TREATMENT CONDITIONS 08/17/2014      SCHEDULING COMMUNICATION      ondansetron (ZOFRAN) IVPB 8 mg      dexamethasone (DECADRON) injection 10 mg      etoposide (VEPESID) 210 mg in sodium chloride 0.9 % 600 mL chemo infusion      sodium chloride 0.9 % injection 10 mL      heparin lock flush 100 unit/mL      heparin  lock flush 100 unit/mL      alteplase (CATHFLO ACTIVASE) injection 2 mg      sodium chloride 0.9 % injection 3 mL      Hot Pack 1 packet      0.9 %  sodium chloride infusion 08/18/2014      SCHEDULING COMMUNICATION      ondansetron (ZOFRAN) IVPB 8 mg      dexamethasone (DECADRON) injection 10 mg      etoposide (VEPESID) 210 mg in sodium chloride 0.9 % 600 mL chemo infusion      sodium chloride 0.9 % injection 10 mL      heparin lock flush 100 unit/mL      heparin lock flush 100 unit/mL      alteplase (CATHFLO ACTIVASE) injection 2 mg      sodium chloride 0.9 % injection 3 mL      Hot Pack 1 packet      0.9 %  sodium chloride infusion    ROS  Past Medical History  Diagnosis Date  . Brain metastases 06/21/2014    History reviewed. No pertinent past surgical history.  has Lung mass; Brain metastases; Small cell carcinoma of  lung; Neoplasm related pain; Candida infection, oral; Sepsis; and Pneumonia on her problem list.     has No Known Allergies.    Medication List       This list is accurate as of: 07/29/14  3:21 PM.  Always use your most recent med list.               dexamethasone 4 MG tablet  Commonly known as:  DECADRON  Take 1 tablet (4 mg total) by mouth 2 (two) times daily.     ipratropium-albuterol 0.5-2.5 (3) MG/3ML Soln  Commonly known as:  DUONEB  Take 3 mLs by nebulization every 6 (six) hours as needed.     levofloxacin 500 MG tablet  Commonly known as:  LEVAQUIN  Take 1 tablet (500 mg total) by mouth daily.     multivitamin with minerals tablet  Take 1 tablet by mouth daily.     ondansetron 4 MG tablet  Commonly known as:  ZOFRAN  Take 1 tablet (4 mg total) by mouth every 6 (six) hours as needed for nausea.     oxyCODONE-acetaminophen 5-325 MG per tablet  Commonly known as:  PERCOCET  Take 1 tablet by mouth every 6 (six) hours as needed.     oxyCODONE-acetaminophen 5-325 MG per tablet  Commonly known as:  PERCOCET  Take 1 tablet by mouth  every 6 (six) hours as needed.     prochlorperazine 10 MG tablet  Commonly known as:  COMPAZINE  Take 1 tablet (10 mg total) by mouth every 6 (six) hours as needed for nausea or vomiting.         PHYSICAL EXAMINATION  Vitals: BP 119/83, HR 122, temp 101.5, sat 93  Physical Exam  Constitutional: She is oriented to person, place, and time. She appears dehydrated. She appears unhealthy. She has a sickly appearance.  HENT:  Head: Normocephalic and atraumatic.  Mouth/Throat: Oropharynx is clear and moist.  Eyes: Conjunctivae and EOM are normal. Pupils are equal, round, and reactive to light. Right eye exhibits no discharge. Left eye exhibits no discharge. No scleral icterus.  Neck: Normal range of motion. Neck supple. No JVD present. No tracheal deviation present. No thyromegaly present.  Cardiovascular: Regular rhythm, normal heart sounds and intact distal pulses.   Tachycardic rate of 122  Pulmonary/Chest: She is in respiratory distress. She has wheezes. She has rales. She exhibits no tenderness.  Abdominal: Soft. Bowel sounds are normal. She exhibits no distension and no mass. There is no tenderness. There is no rebound and no guarding.  Musculoskeletal: Normal range of motion. She exhibits no edema.  Lymphadenopathy:    She has no cervical adenopathy.  Neurological: She is alert and oriented to person, place, and time.  Skin: Skin is warm and dry. No rash noted. No erythema.  Psychiatric: Affect normal.  Nursing note and vitals reviewed.   LABORATORY DATA:. Clinical Support on 07/29/2014  Component Date Value Ref Range Status  . WBC 07/29/2014 27.5* 3.9 - 10.3 10e3/uL Final  . NEUT# 07/29/2014 25.3* 1.5 - 6.5 10e3/uL Final  . HGB 07/29/2014 12.0  11.6 - 15.9 g/dL Final  . HCT 07/29/2014 36.4  34.8 - 46.6 % Final  . Platelets 07/29/2014 224  145 - 400 10e3/uL Final  . MCV 07/29/2014 92.6  79.5 - 101.0 fL Final  . MCH 07/29/2014 30.5  25.1 - 34.0 pg Final  . MCHC 07/29/2014  33.0  31.5 - 36.0 g/dL Final  . RBC 07/29/2014 3.93  3.70 -  5.45 10e6/uL Final  . RDW 07/29/2014 19.6* 11.2 - 14.5 % Final  . lymph# 07/29/2014 2.1  0.9 - 3.3 10e3/uL Final  . MONO# 07/29/2014 0.1  0.1 - 0.9 10e3/uL Final  . Eosinophils Absolute 07/29/2014 0.0  0.0 - 0.5 10e3/uL Final  . Basophils Absolute 07/29/2014 0.0  0.0 - 0.1 10e3/uL Final  . NEUT% 07/29/2014 92.2* 38.4 - 76.8 % Final  . LYMPH% 07/29/2014 7.6* 14.0 - 49.7 % Final  . MONO% 07/29/2014 0.2  0.0 - 14.0 % Final  . EOS% 07/29/2014 0.0  0.0 - 7.0 % Final  . BASO% 07/29/2014 0.0  0.0 - 2.0 % Final  . Sodium 07/29/2014 135* 136 - 145 mEq/L Final  . Potassium 07/29/2014 4.5  3.5 - 5.1 mEq/L Final  . Chloride 07/29/2014 98  98 - 109 mEq/L Final  . CO2 07/29/2014 25  22 - 29 mEq/L Final  . Glucose 07/29/2014 114  70 - 140 mg/dl Final  . BUN 07/29/2014 13.4  7.0 - 26.0 mg/dL Final  . Creatinine 07/29/2014 0.6  0.6 - 1.1 mg/dL Final  . Total Bilirubin 07/29/2014 0.36  0.20 - 1.20 mg/dL Final  . Alkaline Phosphatase 07/29/2014 108  40 - 150 U/L Final  . AST 07/29/2014 20  5 - 34 U/L Final  . ALT 07/29/2014 51  0 - 55 U/L Final  . Total Protein 07/29/2014 6.0* 6.4 - 8.3 g/dL Final  . Albumin 07/29/2014 2.8* 3.5 - 5.0 g/dL Final  . Calcium 07/29/2014 9.1  8.4 - 10.4 mg/dL Final  . Anion Gap 07/29/2014 12* 3 - 11 mEq/L Final  . EGFR 07/29/2014 >90  >90 ml/min/1.73 m2 Final   eGFR is calculated using the CKD-EPI Creatinine Equation (2009)     RADIOGRAPHIC STUDIES: Dg Chest 2 View  07/29/2014   CLINICAL DATA:  History of lung carcinoma, wheezing, short of breath, fever  EXAM: CHEST  2 VIEW  COMPARISON:  Chest x-ray of 06/01/2014 and CT chest of the same date  FINDINGS: The large right hilar mass is again noted with postobstructive pneumonitis extending into the right middle lobe and right lower lobe. However there is a new opacity in the left mid lung which appears to be in the left upper lobe on the lateral view with adjacent  parenchymal infiltrate as well. Although this could be inflammatory, metastatic involvement of the left lung cannot be excluded. There is evidence of right paratracheal adenopathy. No pleural effusion is seen. Heart size is stable.  IMPRESSION: 1. Right hilar mass with postobstructive pneumonitis extending in the right middle lobe and right lower lobe. 2. New opacity in the left mid lung possibly in the left upper lobe inferiorly consistent with pneumonia or possibly metastatic involvement with surrounding pneumonia. 3. Right paratracheal adenopathy.   Electronically Signed   By: Ivar Drape M.D.   On: 07/29/2014 12:18    ASSESSMENT/PLAN:    Pneumonia Patient presented to the [facility] today 0 101.5, heart rate of 123, worsening dyspnea and audible wheezes. Chills appear fairly weak. O2 sat was in the low 90s on room air. Patient was placed on O2 via nasal cannula at 2 L. Patient was also given an albuterol nebulizer treatment while at the [facility]; which did help relieve some of her wheezing. Chest x-ray obtained today revealed pneumonia and possible progression of her disease. Patient was direct admitted to the hospital under the care of hospitalist Dr.Abrol.  Brief history and report was given to Dr. Allyson Sabal prior  to patient being transported to the hospital for direct admission with O2 per cancer Center nurse.  Small cell carcinoma of lung Patient initiated cycle 2, day 1 of her carboplatin/etoposide chemotherapy regimen on 07/26/2014. She received all 3 days of the etoposide; but did not receive the Neulasta portion of her chemotherapy regimen today due to  new diagnosis of pneumonia. Patient has plans to initiate cycle 3 of the same regimen on 08/16/2014.  Patient stated understanding of all instructions; and was in agreement with this plan of care. The patient knows to call the clinic with any problems, questions or concerns.   This was a shared visit with Dr. Julien Nordmann today.  Total time  spent with patient was 40 minutes;  with greater than 75 percent of that time spent in face to face counseling regarding her symptoms, and coordination of care and follow up.  Disclaimer: This note was dictated with voice recognition software. Similar sounding words can inadvertently be transcribed and may not be corrected upon review.   Drue Second, NP 07/31/2014   ADDENDUM: Hematology/Oncology Attending: I had a face to face encounter with the patient. I recommended her care plan. This is a very pleasant 58 years old African-American female recently diagnosed with extensive stage small cell lung cancer status post 2 cycles of systemic chemotherapy with carboplatin and etoposide. The second cycle was given few days ago. The patient is here today to receive Neulasta injection after the second cycle of her chemotherapy and complained of fever up to 101.5. She is feeling fatigued and tired. Chest x-ray performed earlier today showed findings suspicious for pneumonia. I strongly recommended for the patient to be admitted to [facility] for further evaluation and IV antibiotic coverage. She was reluctant at the beginning but the patient finally agreed to proceed with the admission. I will continue to follow her in the hospital and give further recommendation as needed. The patient would come back for follow-up visit as previously scheduled. She was advised to call immediately if she has any other concerning symptoms.  Disclaimer: This note was dictated with voice recognition software. Similar sounding words can inadvertently be transcribed and may be missed upon review. Eilleen Kempf., MD 08/01/2014

## 2014-07-31 NOTE — Discharge Instructions (Signed)

## 2014-07-31 NOTE — Progress Notes (Signed)
CARE MANAGEMENT NOTE 07/31/2014  Patient:  Mariah Park, Mariah Park   Account Number:  [id number]  Date Initiated:  07/31/2014  Documentation initiated by:  [facility]  Subjective/Objective Assessment:   cancer     Action/Plan:   Anticipated DC Date:  07/31/2014   Anticipated DC Plan:  [place]  CM consult      Choice offered to / List presented to:     DME arranged  NEBULIZER MACHINE      DME agency  [place].        Status of service:  Completed, signed off Medicare Important Message given?   (If response is "NO", the following Medicare IM given date fields will be blank) Date Medicare IM given:   Medicare IM given by:   Date Additional Medicare IM given:   Additional Medicare IM given by:    Discharge Disposition:  HOME/SELF CARE  Per UR Regulation:    If discussed at Long Length of Stay Meetings, dates discussed:    Comments:  07/31/2014 1030 NCM spoke to pt and states she is with the [facility] program. Her RN, Baron Hamper # [phone]. NCM contacted RN and states they will assist with getting pt her medications. [facility] notfied for nebulizer machine for home. Pt stays at the [facility] in [place]. Provided by the [employer]. CSW provided voucher for taxi ride home.  Jonnie Finner RN CCM Case Mgmt phone [phone]

## 2014-07-31 NOTE — Clinical Social Work Note (Signed)
  CSW received call from RN who stated that MD was requesting a taxi voucher for pt to discharge today  CSW brought RN taxi voucher for Pt  No further CSW needs  CSW signing off  .Dede Query, LCSW [facility] Clinical Social Worker - Weekend Coverage cell #: [phone]

## 2014-07-31 NOTE — Assessment & Plan Note (Signed)
Patient presented to the [facility] today 0 101.5, heart rate of 123, worsening dyspnea and audible wheezes. Chills appear fairly weak. O2 sat was in the low 90s on room air. Patient was placed on O2 via nasal cannula at 2 L. Patient was also given an albuterol nebulizer treatment while at the [facility]; which did help relieve some of her wheezing. Chest x-ray obtained today revealed pneumonia and possible progression of her disease. Patient was direct admitted to the hospital under the care of hospitalist Dr.Abrol.  Brief history and report was given to Dr. Allyson Sabal prior to patient being transported to the hospital for direct admission with O2 per [place].

## 2014-07-31 NOTE — Discharge Summary (Signed)
Physician Discharge Summary  Mariah Park JJO:[id number] DOB: 08/15/1955 DOA: 07/29/2014  PCP: No PCP Per Patient  Admit date: 07/29/2014 Discharge date: 07/31/2014  Recommendations for Outpatient Follow-up:  1. Pt will need to follow up with PCP in 2-3 weeks post discharge 2. Please obtain BMP to evaluate electrolytes and kidney function 3. Please also check CBC to evaluate Hg and Hct levels 4. Continue Levaquin for 7 days 5. Please note that pt insisting on going home today  Discharge Diagnoses:  Active Problems:   Sepsis   Discharge Condition: Stable  Diet recommendation: Heart healthy diet discussed in details   History of present illness:  Brief narrative:   Pt is 58 yo female with lung cancer, follows with Dr. Julien Nordmann (on carboplantin ,etoposide , last chemo 12/15), came in to get neulasta at he cancer center and found to have PNA on CXR. VS in the office notable for BP 110/83, HR 120 's , on 2 L of oxygen, T 101.5 F. Pt is rather poor historian and somewhat tired upon arrival to [facility] so unable to provide much details. She explains she has been tired over the past week, has noted exertional dyspnea that has progressed to intermittent dyspnea at rest, associated with productive cough of yellow sputum, subjective fevers, chills, malaise, chest discomfort with coughing spells. Pt explains she is depressed about her diagnosis and does not feel like talking about it for now.   Upon arrival to the unit 5 E at [facility], VS notable for T 101.5 F, HR 110 bpm, BP 95/54 mmHg, oxygen saturations 93% on 2 L [place]. [facility] called to admit for further management of sepsis secondary to PNA.  Assessment and Plan: Active Problems: Sepsis  - criteria met on admission with VS noted above and with WBC 27K, source PNA - continued Vancomycin and Zosyn day #3 and since pt wants to go home today will transition to oral Levaquin upon discharge  - pt eating and ambulation, insisting on going home Acute  respiratory failure - secondary to PNA, will treat as HCAP given immunocompromised state - maintaining oxygen saturations at target range  Leukocytosis - secondary to the above, also from decadron effect - WBC actually down from recent value of 40 K and still trending down  Anemia of chronic disease, lung ca - drop in Hg dilutional due to IVF pt has received - no signs of active bleeding Lung cancer - advised to follow up with Dr. Julien Nordmann upon discharge    Code Status: Full.  Family Communication: Pt  Disposition Plan: Home   IV access:   Peripheral IV Procedures and diagnostic studies:    Dg Chest 2 View 07/29/2014 Right hilar mass with postobstructive pneumonitis extending in the right middle lobe and right lower lobe. 2. New opacity in the left mid lung possibly in the left upper lobe inferiorly consistent with pneumonia or possibly metastatic involvement with surrounding pneumonia. 3. Right paratracheal adenopathy.  Medical Consultants:   None  Other Consultants:   None IAnti-Infectives:    Vancomycin 12/17 --> 12/19  Zosyn 12/17 --> 12/19  Levaquin 12/19   Discharge Exam: Filed Vitals:   07/30/14 2135  BP: 129/72  Pulse: 94  Temp: 97.4 F (36.3 C)  Resp: 20   Filed Vitals:   07/30/14 1820 07/30/14 2023 07/30/14 2135 07/31/14 0741  BP:   129/72   Pulse:   94   Temp:   97.4 F (36.3 C)   TempSrc:   Oral   Resp:  20   Height:      Weight:      SpO2: 94% 93% 99% 89%    General: Pt is alert, follows commands appropriately, not in acute distress Cardiovascular: Regular rate and rhythm, S1/S2 +, no murmurs, no rubs, no gallops Respiratory: Clear to auscultation bilaterally, no wheezing, rhonchi at bases  Abdominal: Soft, non tender, non distended, bowel sounds +, no guarding  Discharge Instructions  Discharge Instructions    Diet - low sodium heart healthy    Complete by:  As directed      Increase activity slowly    Complete by:  As  directed             Medication List    TAKE these medications        aspirin 81 MG tablet  Take 81 mg by mouth daily as needed for pain (chest pain).     dexamethasone 4 MG tablet  Commonly known as:  DECADRON  Take 1 tablet (4 mg total) by mouth 2 (two) times daily.     ipratropium-albuterol 0.5-2.5 (3) MG/3ML Soln  Commonly known as:  DUONEB  Take 3 mLs by nebulization every 6 (six) hours as needed.     levofloxacin 500 MG tablet  Commonly known as:  LEVAQUIN  Take 1 tablet (500 mg total) by mouth daily.     multivitamin with minerals tablet  Take 1 tablet by mouth daily.     ondansetron 4 MG tablet  Commonly known as:  ZOFRAN  Take 1 tablet (4 mg total) by mouth every 6 (six) hours as needed for nausea.     oxyCODONE-acetaminophen 5-325 MG per tablet  Commonly known as:  PERCOCET  Take 1 tablet by mouth every 6 (six) hours as needed.     prochlorperazine 10 MG tablet  Commonly known as:  COMPAZINE  Take 1 tablet (10 mg total) by mouth every 6 (six) hours as needed for nausea or vomiting.           Follow-up Information    Follow up with Faye Ramsay, MD.   Specialty:  Internal Medicine   Why:  As needed, If symptoms worsen   Contact information:   [address] [place] [address] [phone]        The results of significant diagnostics from this hospitalization (including imaging, microbiology, ancillary and laboratory) are listed below for reference.     Microbiology: No results found for this or any previous visit (from the past 240 hour(s)).   Labs: Basic Metabolic Panel:  Recent Labs Lab 07/26/14 0838 07/29/14 1238 07/30/14 0504 07/31/14 0542  NA 140 135* 138 136*  K 4.5 4.5 4.2 4.5  CL  --   --  99 94*  CO2 _0 GLUCOSE 97 114 100* 212*  BUN 22.0 13._1 CREATININE 0.7 0.6 0.45* 0.41*  CALCIUM 9.0 9.1 8.9 9.8   Liver Function Tests:  Recent Labs Lab 07/26/14 0838 07/29/14 1238   AST 22 20  ALT 51 51  ALKPHOS 125 108  BILITOT 0.21 0.36  PROT 5.9* 6.0*  ALBUMIN 3.1* 2.8*   CBC:  Recent Labs Lab 07/26/14 0837 07/29/14 1238 07/30/14 0504 07/31/14 0542  WBC 47.8* 27.5* 18.9* 16.5*  NEUTROABS 40.6* 25.3*  --   --   HGB 12.5 12.0 10.6* 10.4*  HCT 38.7 36.4 32.0* 31.6*  MCV 93.8 92.6 93.8 93.2  PLT 254 224 188 221  BNP (last 3 results)  Recent Labs  06/01/14 1614  PROBNP 68.9   SIGNED: Time coordinating discharge: Over 30 minutes  Faye Ramsay, MD  [facility] 07/31/2014, 8:28 AM Pager [phone]  If 7PM-7AM, please contact night-coverage [email or web address] Password [email or web address]

## 2014-07-31 NOTE — Assessment & Plan Note (Signed)
Patient initiated cycle 2, day 1 of her carboplatin/etoposide chemotherapy regimen on 07/26/2014. She received all 3 days of the etoposide; but did not receive the Neulasta portion of her chemotherapy regimen today due to  new diagnosis of pneumonia. Patient has plans to initiate cycle 3 of the same regimen on 08/16/2014.

## 2014-08-02 ENCOUNTER — Telehealth: Payer: Self-pay | Admitting: *Deleted

## 2014-08-02 ENCOUNTER — Other Ambulatory Visit: Payer: Self-pay | Admitting: *Deleted

## 2014-08-02 ENCOUNTER — Ambulatory Visit (HOSPITAL_BASED_OUTPATIENT_CLINIC_OR_DEPARTMENT_OTHER): Payer: Self-pay | Admitting: Lab

## 2014-08-02 DIAGNOSIS — C3491 Malignant neoplasm of unspecified part of right bronchus or lung: Secondary | ICD-10-CM

## 2014-08-02 DIAGNOSIS — C3490 Malignant neoplasm of unspecified part of unspecified bronchus or lung: Secondary | ICD-10-CM

## 2014-08-02 LAB — CBC WITH DIFFERENTIAL/PLATELET
BASO%: 0.4 % (ref 0.0–2.0)
Basophils Absolute: 0 10*3/uL (ref 0.0–0.1)
EOS ABS: 0 10*3/uL (ref 0.0–0.5)
EOS%: 0 % (ref 0.0–7.0)
HCT: 31.5 % — ABNORMAL LOW (ref 34.8–46.6)
HEMOGLOBIN: 10.4 g/dL — AB (ref 11.6–15.9)
LYMPH%: 17.6 % (ref 14.0–49.7)
MCH: 30.7 pg (ref 25.1–34.0)
MCHC: 33 g/dL (ref 31.5–36.0)
MCV: 92.8 fL (ref 79.5–101.0)
MONO#: 0.1 10*3/uL (ref 0.1–0.9)
MONO%: 1.1 % (ref 0.0–14.0)
NEUT#: 4.4 10*3/uL (ref 1.5–6.5)
NEUT%: 80.9 % — AB (ref 38.4–76.8)
Platelets: 252 10*3/uL (ref 145–400)
RBC: 3.4 10*6/uL — ABNORMAL LOW (ref 3.70–5.45)
RDW: 18 % — ABNORMAL HIGH (ref 11.2–14.5)
WBC: 5.5 10*3/uL (ref 3.9–10.3)
lymph#: 1 10*3/uL (ref 0.9–3.3)

## 2014-08-02 LAB — COMPREHENSIVE METABOLIC PANEL (CC13)
ALT: 41 U/L (ref 0–55)
ANION GAP: 11 meq/L (ref 3–11)
AST: 16 U/L (ref 5–34)
Albumin: 2.7 g/dL — ABNORMAL LOW (ref 3.5–5.0)
Alkaline Phosphatase: 82 U/L (ref 40–150)
BUN: 16.8 mg/dL (ref 7.0–26.0)
CALCIUM: 9.6 mg/dL (ref 8.4–10.4)
CHLORIDE: 104 meq/L (ref 98–109)
CO2: 24 mEq/L (ref 22–29)
CREATININE: 0.5 mg/dL — AB (ref 0.6–1.1)
EGFR: >90 mL/min/{1.73_m2} (ref >90)
GLUCOSE: 156 mg/dL — AB (ref 70–140)
Potassium: 4.2 mEq/L (ref 3.5–5.1)
Sodium: 139 mEq/L (ref 136–145)
Total Bilirubin: <0.2 mg/dL (ref 0.20–1.20)
Total Protein: 6.6 g/dL (ref 6.4–8.3)

## 2014-08-02 MED ORDER — OXYCODONE-ACETAMINOPHEN 5-325 MG PO TABS: 1.0000 | ORAL_TABLET | Freq: Four times a day (QID) | ORAL | Status: DC | PRN

## 2014-08-02 MED ORDER — LEVOFLOXACIN 500 MG PO TABS: 500.0000 mg | ORAL_TABLET | Freq: Every day | ORAL | Status: DC

## 2014-08-02 MED ORDER — IPRATROPIUM-ALBUTEROL 0.5-2.5 (3) MG/3ML IN SOLN: 3.0000 mL | Freq: Four times a day (QID) | RESPIRATORY_TRACT | Status: AC | PRN

## 2014-08-02 MED ORDER — ONDANSETRON HCL 8 MG PO TABS: 8.0000 mg | ORAL_TABLET | Freq: Three times a day (TID) | ORAL | Status: DC | PRN

## 2014-08-02 NOTE — Telephone Encounter (Signed)
Received call from Rochelle.  She is working with patient to help get her rx's home and help pt in the home.  Felicia states that rx's duoneb, zofran, levaquin, and percocet rx need to be written by Dr Vista Mink in order for the rx's to be covered.  Ok per Dr Vista Mink to fill rx's.  Informed Felicia.  Ph# [phone]

## 2014-08-02 NOTE — Telephone Encounter (Signed)
-----   Message from Drue Second, NP sent at 07/31/2014 11:05 PM EST ----- PROVIDER: ACTION REQUESTED / SHARED VISIT Triage: follow up call 24-48 hours please.

## 2014-08-02 NOTE — Telephone Encounter (Signed)
Called patient for follow up. States she was discharged from hospital on Saturday and is doing well. Is at [facility] currently for labs

## 2014-08-04 LAB — CULTURE, BLOOD (SINGLE)

## 2014-08-05 LAB — CULTURE, BLOOD (ROUTINE X 2)
Culture: NO GROWTH
Culture: NO GROWTH

## 2014-08-09 ENCOUNTER — Other Ambulatory Visit (HOSPITAL_BASED_OUTPATIENT_CLINIC_OR_DEPARTMENT_OTHER): Payer: Self-pay

## 2014-08-09 DIAGNOSIS — C7931 Secondary malignant neoplasm of brain: Secondary | ICD-10-CM

## 2014-08-09 DIAGNOSIS — C3491 Malignant neoplasm of unspecified part of right bronchus or lung: Secondary | ICD-10-CM

## 2014-08-09 LAB — COMPREHENSIVE METABOLIC PANEL (CC13)
ALBUMIN: 3.1 g/dL — AB (ref 3.5–5.0)
ALT: 47 U/L (ref 0–55)
ANION GAP: 9 meq/L (ref 3–11)
AST: 23 U/L (ref 5–34)
Alkaline Phosphatase: 77 U/L (ref 40–150)
BUN: 14.6 mg/dL (ref 7.0–26.0)
CALCIUM: 9.5 mg/dL (ref 8.4–10.4)
CHLORIDE: 101 meq/L (ref 98–109)
CO2: 28 meq/L (ref 22–29)
CREATININE: 0.6 mg/dL (ref 0.6–1.1)
EGFR: >90 mL/min/{1.73_m2} (ref >90)
Glucose: 110 mg/dl (ref 70–140)
POTASSIUM: 4.6 meq/L (ref 3.5–5.1)
Sodium: 138 mEq/L (ref 136–145)
TOTAL PROTEIN: 6.2 g/dL — AB (ref 6.4–8.3)
Total Bilirubin: 0.24 mg/dL (ref 0.20–1.20)

## 2014-08-09 LAB — CBC WITH DIFFERENTIAL/PLATELET
BASO%: 1.8 % (ref 0.0–2.0)
BASOS ABS: 0.1 10*3/uL (ref 0.0–0.1)
EOS%: 0 % (ref 0.0–7.0)
Eosinophils Absolute: 0 10*3/uL (ref 0.0–0.5)
HEMATOCRIT: 33.6 % — AB (ref 34.8–46.6)
HEMOGLOBIN: 11.2 g/dL — AB (ref 11.6–15.9)
LYMPH#: 2.6 10*3/uL (ref 0.9–3.3)
LYMPH%: 45.9 % (ref 14.0–49.7)
MCH: 31.2 pg (ref 25.1–34.0)
MCHC: 33.3 g/dL (ref 31.5–36.0)
MCV: 93.6 fL (ref 79.5–101.0)
MONO#: 0.9 10*3/uL (ref 0.1–0.9)
MONO%: 16.6 % — ABNORMAL HIGH (ref 0.0–14.0)
NEUT#: 2 10*3/uL (ref 1.5–6.5)
NEUT%: 35.7 % — AB (ref 38.4–76.8)
Platelets: 201 10*3/uL (ref 145–400)
RBC: 3.59 10*6/uL — ABNORMAL LOW (ref 3.70–5.45)
RDW: 21.3 % — ABNORMAL HIGH (ref 11.2–14.5)
WBC: 5.6 10*3/uL (ref 3.9–10.3)
nRBC: 11 % — ABNORMAL HIGH (ref 0–0)

## 2014-08-11 ENCOUNTER — Encounter (HOSPITAL_COMMUNITY): Payer: Self-pay

## 2014-08-11 ENCOUNTER — Other Ambulatory Visit: Payer: Self-pay | Admitting: Nurse Practitioner

## 2014-08-11 ENCOUNTER — Ambulatory Visit (HOSPITAL_COMMUNITY)
Admission: RE | Admit: 2014-08-11 | Discharge: 2014-08-11 | Disposition: A | Discharge status: Home / Self Care | Payer: Medicaid Other | Source: Ambulatory Visit | Attending: Nurse Practitioner | Admitting: Nurse Practitioner

## 2014-08-11 DIAGNOSIS — K5900 Constipation, unspecified: Secondary | ICD-10-CM | POA: Insufficient documentation

## 2014-08-11 DIAGNOSIS — R222 Localized swelling, mass and lump, trunk: Secondary | ICD-10-CM | POA: Insufficient documentation

## 2014-08-11 DIAGNOSIS — C3490 Malignant neoplasm of unspecified part of unspecified bronchus or lung: Secondary | ICD-10-CM | POA: Diagnosis present

## 2014-08-11 DIAGNOSIS — R590 Localized enlarged lymph nodes: Secondary | ICD-10-CM | POA: Insufficient documentation

## 2014-08-11 DIAGNOSIS — I2510 Atherosclerotic heart disease of native coronary artery without angina pectoris: Secondary | ICD-10-CM | POA: Insufficient documentation

## 2014-08-11 DIAGNOSIS — I700 Atherosclerosis of aorta: Secondary | ICD-10-CM | POA: Diagnosis not present

## 2014-08-11 DIAGNOSIS — J432 Centrilobular emphysema: Secondary | ICD-10-CM | POA: Insufficient documentation

## 2014-08-11 DIAGNOSIS — C3491 Malignant neoplasm of unspecified part of right bronchus or lung: Secondary | ICD-10-CM

## 2014-08-11 DIAGNOSIS — R079 Chest pain, unspecified: Secondary | ICD-10-CM | POA: Diagnosis not present

## 2014-08-11 MED ORDER — IOHEXOL 300 MG/ML  SOLN
50.0000 mL | Freq: Once | INTRAMUSCULAR | Status: AC | PRN
  Administered 2014-08-11: 50 mL via ORAL

## 2014-08-11 MED ORDER — IOHEXOL 300 MG/ML  SOLN
100.0000 mL | Freq: Once | INTRAMUSCULAR | Status: AC | PRN
  Administered 2014-08-11: 100 mL via INTRAVENOUS

## 2014-08-12 ENCOUNTER — Other Ambulatory Visit: Payer: Self-pay | Admitting: *Deleted

## 2014-08-12 DIAGNOSIS — C3490 Malignant neoplasm of unspecified part of unspecified bronchus or lung: Secondary | ICD-10-CM

## 2014-08-12 MED ORDER — OXYCODONE-ACETAMINOPHEN 5-325 MG PO TABS: 1.0000 | ORAL_TABLET | Freq: Four times a day (QID) | ORAL | Status: DC | PRN

## 2014-08-16 ENCOUNTER — Ambulatory Visit (HOSPITAL_BASED_OUTPATIENT_CLINIC_OR_DEPARTMENT_OTHER): Payer: Self-pay | Admitting: Internal Medicine

## 2014-08-16 ENCOUNTER — Encounter: Payer: Self-pay | Admitting: Internal Medicine

## 2014-08-16 ENCOUNTER — Encounter: Payer: Self-pay | Admitting: *Deleted

## 2014-08-16 ENCOUNTER — Ambulatory Visit (HOSPITAL_BASED_OUTPATIENT_CLINIC_OR_DEPARTMENT_OTHER): Payer: Self-pay

## 2014-08-16 ENCOUNTER — Other Ambulatory Visit (HOSPITAL_BASED_OUTPATIENT_CLINIC_OR_DEPARTMENT_OTHER): Payer: Self-pay

## 2014-08-16 VITALS — BP 153/73 | HR 103 | Temp 99.7°F | Resp 19 | Ht 64.0 in | Wt 163.1 lb

## 2014-08-16 DIAGNOSIS — C3491 Malignant neoplasm of unspecified part of right bronchus or lung: Secondary | ICD-10-CM

## 2014-08-16 DIAGNOSIS — C7931 Secondary malignant neoplasm of brain: Secondary | ICD-10-CM

## 2014-08-16 DIAGNOSIS — Z5111 Encounter for antineoplastic chemotherapy: Secondary | ICD-10-CM

## 2014-08-16 DIAGNOSIS — C3490 Malignant neoplasm of unspecified part of unspecified bronchus or lung: Secondary | ICD-10-CM

## 2014-08-16 DIAGNOSIS — G893 Neoplasm related pain (acute) (chronic): Secondary | ICD-10-CM

## 2014-08-16 LAB — CBC WITH DIFFERENTIAL/PLATELET
BASO%: 0.9 % (ref 0.0–2.0)
BASOS ABS: 0.2 10*3/uL — AB (ref 0.0–0.1)
EOS ABS: 0 10*3/uL (ref 0.0–0.5)
EOS%: 0 % (ref 0.0–7.0)
HCT: 35.9 % (ref 34.8–46.6)
HGB: 11.7 g/dL (ref 11.6–15.9)
LYMPH%: 12.8 % — ABNORMAL LOW (ref 14.0–49.7)
MCH: 31.5 pg (ref 25.1–34.0)
MCHC: 32.6 g/dL (ref 31.5–36.0)
MCV: 96.8 fL (ref 79.5–101.0)
MONO#: 1.1 10*3/uL — ABNORMAL HIGH (ref 0.1–0.9)
MONO%: 4.7 % (ref 0.0–14.0)
NEUT%: 81.6 % — ABNORMAL HIGH (ref 38.4–76.8)
NEUTROS ABS: 18.4 10*3/uL — AB (ref 1.5–6.5)
Platelets: 404 10*3/uL — ABNORMAL HIGH (ref 145–400)
RBC: 3.71 10*6/uL (ref 3.70–5.45)
RDW: 23.4 % — AB (ref 11.2–14.5)
WBC: 22.5 10*3/uL — ABNORMAL HIGH (ref 3.9–10.3)
lymph#: 2.9 10*3/uL (ref 0.9–3.3)

## 2014-08-16 LAB — COMPREHENSIVE METABOLIC PANEL (CC13)
ALBUMIN: 3.3 g/dL — AB (ref 3.5–5.0)
ALT: 89 U/L — ABNORMAL HIGH (ref 0–55)
AST: 38 U/L — AB (ref 5–34)
Alkaline Phosphatase: 110 U/L (ref 40–150)
Anion Gap: 13 mEq/L — ABNORMAL HIGH (ref 3–11)
BILIRUBIN TOTAL: 0.26 mg/dL (ref 0.20–1.20)
BUN: 19.4 mg/dL (ref 7.0–26.0)
CO2: 26 mEq/L (ref 22–29)
Calcium: 9.2 mg/dL (ref 8.4–10.4)
Chloride: 99 mEq/L (ref 98–109)
Creatinine: 0.7 mg/dL (ref 0.6–1.1)
EGFR: >90 mL/min/{1.73_m2} (ref >90)
Glucose: 287 mg/dl — ABNORMAL HIGH (ref 70–140)
POTASSIUM: 4.3 meq/L (ref 3.5–5.1)
SODIUM: 139 meq/L (ref 136–145)
Total Protein: 6.3 g/dL — ABNORMAL LOW (ref 6.4–8.3)

## 2014-08-16 LAB — TECHNOLOGIST REVIEW

## 2014-08-16 MED ORDER — SODIUM CHLORIDE 0.9 % IV SOLN
Freq: Once | INTRAVENOUS | Status: AC
  Administered 2014-08-16: 13:00:00 via INTRAVENOUS

## 2014-08-16 MED ORDER — DEXAMETHASONE SODIUM PHOSPHATE 20 MG/5ML IJ SOLN
20.0000 mg | Freq: Once | INTRAMUSCULAR | Status: AC
  Administered 2014-08-16: 20 mg via INTRAVENOUS

## 2014-08-16 MED ORDER — ONDANSETRON 16 MG/50ML IVPB (CHCC)
16.0000 mg | Freq: Once | INTRAVENOUS | Status: AC
  Administered 2014-08-16: 16 mg via INTRAVENOUS

## 2014-08-16 MED ORDER — DEXAMETHASONE SODIUM PHOSPHATE 20 MG/5ML IJ SOLN
INTRAMUSCULAR | Status: AC
  Filled 2014-08-16: qty 5

## 2014-08-16 MED ORDER — SODIUM CHLORIDE 0.9 % IV SOLN
554.5000 mg | Freq: Once | INTRAVENOUS | Status: AC
  Administered 2014-08-16: 550 mg via INTRAVENOUS
  Filled 2014-08-16: qty 55

## 2014-08-16 MED ORDER — SODIUM CHLORIDE 0.9 % IV SOLN
120.0000 mg/m2 | Freq: Once | INTRAVENOUS | Status: AC
  Administered 2014-08-16: 210 mg via INTRAVENOUS
  Filled 2014-08-16: qty 10.5

## 2014-08-16 MED ORDER — ONDANSETRON 16 MG/50ML IVPB (CHCC)
INTRAVENOUS | Status: AC
  Filled 2014-08-16: qty 16

## 2014-08-16 NOTE — Progress Notes (Signed)
[place] [phone]   [phone]  OFFICE PROGRESS NOTE  No PCP Per Patient No address on file  DIAGNOSIS: Extensive stage small cell lung cancer diagnosed in November 2015 presented with large right hilar mass with direct mediastinal invasion in addition to mediastinal lymphadenopathy and metastatic lesion in the subcutaneous fat of the left flank.  PRIOR THERAPY: None  CURRENT THERAPY: Systemic chemotherapy with carboplatin for AUC of 5 on day 1 and etoposide 120 MG/M2 on days 1, 2 and 3 with Neulasta support on day 4. First cycle on 07/05/2014. Status post 2 cycles.  INTERVAL HISTORY: Mariah Park 59 y.o. female returns to the clinic today for follow-up visit. The patient is tolerating her systemic chemotherapy with carboplatin and etoposide fairly well with no significant adverse effects. She denied having any significant nausea or vomiting, she has low-grade fever but no chills. She denied having any significant chest pain and continues to have improvement in her shortness of breath with no cough or hemoptysis. The patient denied having any significant weight loss or night sweats. She had repeat CT scan of the chest, abdomen and pelvis performed recently and she is here for evaluation and discussion of her scan results.  MEDICAL HISTORY: Past Medical History  Diagnosis Date  . Brain metastases 06/21/2014    ALLERGIES:  has No Known Allergies.  MEDICATIONS:  Current Outpatient Prescriptions  Medication Sig Dispense Refill  . aspirin 81 MG tablet Take 81 mg by mouth daily as needed for pain (chest pain).    Marland Kitchen dexamethasone (DECADRON) 4 MG tablet Take 1 tablet (4 mg total) by mouth 2 (two) times daily. 60 tablet 0  . ipratropium-albuterol (DUONEB) 0.5-2.5 (3) MG/3ML SOLN Take 3 mLs by nebulization every 6 (six) hours as needed. 360 mL 1  . levofloxacin (LEVAQUIN) 500 MG tablet Take 1 tablet (500 mg total) by mouth daily. 7 tablet 0  .  Multiple Vitamins-Minerals (MULTIVITAMIN WITH MINERALS) tablet Take 1 tablet by mouth daily.    . ondansetron (ZOFRAN) 8 MG tablet Take 1 tablet (8 mg total) by mouth every 8 (eight) hours as needed for nausea or vomiting. 30 tablet 0  . oxyCODONE-acetaminophen (PERCOCET) 5-325 MG per tablet Take 1 tablet by mouth every 6 (six) hours as needed. 30 tablet 0  . prochlorperazine (COMPAZINE) 10 MG tablet Take 1 tablet (10 mg total) by mouth every 6 (six) hours as needed for nausea or vomiting. 30 tablet 0   No current facility-administered medications for this visit.    SURGICAL HISTORY: No past surgical history on file.  REVIEW OF SYSTEMS:  Constitutional: negative Eyes: negative Ears, nose, mouth, throat, and face: negative Respiratory: positive for cough and dyspnea on exertion Cardiovascular: negative Gastrointestinal: negative Genitourinary:negative Integument/breast: negative Hematologic/lymphatic: negative Musculoskeletal:negative Neurological: negative Behavioral/Psych: negative Endocrine: negative Allergic/Immunologic: negative   PHYSICAL EXAMINATION: General appearance: alert, cooperative and no distress Head: Normocephalic, without obvious abnormality, atraumatic Neck: no adenopathy, no JVD, supple, symmetrical, trachea midline and thyroid not enlarged, symmetric, no tenderness/mass/nodules Lymph nodes: Cervical, supraclavicular, and axillary nodes normal. Resp: clear to auscultation bilaterally Back: symmetric, no curvature. ROM normal. No CVA tenderness. Cardio: regular rate and rhythm, S1, S2 normal, no murmur, click, rub or gallop GI: soft, non-tender; bowel sounds normal; no masses,  no organomegaly Extremities: extremities normal, atraumatic, no cyanosis or edema Neurologic: Alert and oriented X 3, normal strength and tone. Normal symmetric reflexes. Normal coordination and gait  ECOG PERFORMANCE STATUS: 1 -  Symptomatic but completely ambulatory  There were no  vitals taken for this visit.  LABORATORY DATA: Lab Results  Component Value Date   WBC 22.5* 08/16/2014   HGB 11.7 08/16/2014   HCT 35.9 08/16/2014   MCV 96.8 08/16/2014   PLT 404* 08/16/2014      Chemistry      Component Value Date/Time   NA 138 08/09/2014 1036   NA 136* 07/31/2014 0542   K 4.6 08/09/2014 1036   K 4.5 07/31/2014 0542   CL 94* 07/31/2014 0542   CO2 28 08/09/2014 1036   CO2 25 07/31/2014 0542   BUN 14.6 08/09/2014 1036   BUN 10 07/31/2014 0542   CREATININE 0.6 08/09/2014 1036   CREATININE 0.41* 07/31/2014 0542      Component Value Date/Time   CALCIUM 9.5 08/09/2014 1036   CALCIUM 9.8 07/31/2014 0542   ALKPHOS 77 08/09/2014 1036   AST 23 08/09/2014 1036   ALT 47 08/09/2014 1036   BILITOT 0.24 08/09/2014 1036       RADIOGRAPHIC STUDIES: Dg Chest 2 View  07/29/2014   CLINICAL DATA:  History of lung carcinoma, wheezing, short of breath, fever  EXAM: CHEST  2 VIEW  COMPARISON:  Chest x-ray of 06/01/2014 and CT chest of the same date  FINDINGS: The large right hilar mass is again noted with postobstructive pneumonitis extending into the right middle lobe and right lower lobe. However there is a new opacity in the left mid lung which appears to be in the left upper lobe on the lateral view with adjacent parenchymal infiltrate as well. Although this could be inflammatory, metastatic involvement of the left lung cannot be excluded. There is evidence of right paratracheal adenopathy. No pleural effusion is seen. Heart size is stable.  IMPRESSION: 1. Right hilar mass with postobstructive pneumonitis extending in the right middle lobe and right lower lobe. 2. New opacity in the left mid lung possibly in the left upper lobe inferiorly consistent with pneumonia or possibly metastatic involvement with surrounding pneumonia. 3. Right paratracheal adenopathy.   Electronically Signed   By: Ivar Drape M.D.   On: 07/29/2014 12:18   Ct Chest W Contrast  08/11/2014   CLINICAL  DATA:  Small cell lung cancer with ongoing chemotherapy. Mild chest pain.  EXAM: CT CHEST, ABDOMEN, AND PELVIS WITH CONTRAST  TECHNIQUE: Multidetector CT imaging of the chest, abdomen and pelvis was performed following the standard protocol during bolus administration of intravenous contrast.  CONTRAST:  55mL OMNIPAQUE IOHEXOL 300 MG/ML SOLN, 167mL OMNIPAQUE IOHEXOL 300 MG/ML SOLN  COMPARISON:  PET 06/18/2014, CT chest 06/01/2014 and CT abdomen pelvis 08/21/2007.  FINDINGS: CT CHEST FINDINGS  Necrotic appearing mediastinal adenopathy measures up to 2.2 x 2.5 cm in the lower right paratracheal station (previously 3.1 x 3.1 cm). Right hilar mass is contiguous with subcarinal adenopathy and collectively, measures 5.0 x 6.1 cm (previously 7.2 x 7.5 cm). There is mass effect on the adjacent right pulmonary artery right-sided bronchi.  Heart size normal. Coronary artery calcification. No pericardial effusion.  Centrilobular emphysema. Image quality is degraded by respiratory motion. Patchy ground-glass in the left upper lobe, and possibly left lower lobe, is new. Mild volume loss in the right upper lobe. No pleural fluid.  CT ABDOMEN AND PELVIS FINDINGS  Hepatobiliary: The liver and gallbladder are unremarkable. No biliary ductal dilatation.  Pancreas: Negative.  Spleen: Negative.  Adrenals/Urinary Tract: Adrenal glands and right kidney are unremarkable. Fluid density lesion in the upper pole left kidney measures  3.3 cm, stable. Ureters are decompressed. Bladder is unremarkable.  Stomach/Bowel: Stomach, small bowel and appendix are unremarkable. Stool is seen throughout the colon.  Vascular/Lymphatic: Atherosclerotic calcification of the arterial vasculature without abdominal aortic aneurysm. No pathologically enlarged lymph nodes.  Reproductive: Uterus and ovaries are visualized.  Other: A subcutaneous nodule along the left mid abdomen measures 1.5 x 1.9 cm (series 2, image 74), previously 2.1 x 2.4 cm on 06/18/2014. No  definite free fluid. Mesenteries and peritoneum are unremarkable.  Musculoskeletal: No worrisome lytic or sclerotic lesions.  IMPRESSION: 1. Interval response to therapy as evidenced by decrease in size of mediastinal and right hilar adenopathy, as well as a subcutaneous nodule along the lateral left mid abdomen. 2. Coronary artery calcification. 3. Patchy ground-glass in the left upper lobe, and possibly left lower lobe, is new and may be infectious or inflammatory in etiology. 4. Stool throughout the colon is indicative of constipation.   Electronically Signed   By: Lorin Picket M.D.   On: 08/11/2014 15:10   Ct Abdomen Pelvis W Contrast  08/11/2014   CLINICAL DATA:  Small cell lung cancer with ongoing chemotherapy. Mild chest pain.  EXAM: CT CHEST, ABDOMEN, AND PELVIS WITH CONTRAST  TECHNIQUE: Multidetector CT imaging of the chest, abdomen and pelvis was performed following the standard protocol during bolus administration of intravenous contrast.  CONTRAST:  64mL OMNIPAQUE IOHEXOL 300 MG/ML SOLN, 120mL OMNIPAQUE IOHEXOL 300 MG/ML SOLN  COMPARISON:  PET 06/18/2014, CT chest 06/01/2014 and CT abdomen pelvis 08/21/2007.  FINDINGS: CT CHEST FINDINGS  Necrotic appearing mediastinal adenopathy measures up to 2.2 x 2.5 cm in the lower right paratracheal station (previously 3.1 x 3.1 cm). Right hilar mass is contiguous with subcarinal adenopathy and collectively, measures 5.0 x 6.1 cm (previously 7.2 x 7.5 cm). There is mass effect on the adjacent right pulmonary artery right-sided bronchi.  Heart size normal. Coronary artery calcification. No pericardial effusion.  Centrilobular emphysema. Image quality is degraded by respiratory motion. Patchy ground-glass in the left upper lobe, and possibly left lower lobe, is new. Mild volume loss in the right upper lobe. No pleural fluid.  CT ABDOMEN AND PELVIS FINDINGS  Hepatobiliary: The liver and gallbladder are unremarkable. No biliary ductal dilatation.  Pancreas:  Negative.  Spleen: Negative.  Adrenals/Urinary Tract: Adrenal glands and right kidney are unremarkable. Fluid density lesion in the upper pole left kidney measures 3.3 cm, stable. Ureters are decompressed. Bladder is unremarkable.  Stomach/Bowel: Stomach, small bowel and appendix are unremarkable. Stool is seen throughout the colon.  Vascular/Lymphatic: Atherosclerotic calcification of the arterial vasculature without abdominal aortic aneurysm. No pathologically enlarged lymph nodes.  Reproductive: Uterus and ovaries are visualized.  Other: A subcutaneous nodule along the left mid abdomen measures 1.5 x 1.9 cm (series 2, image 74), previously 2.1 x 2.4 cm on 06/18/2014. No definite free fluid. Mesenteries and peritoneum are unremarkable.  Musculoskeletal: No worrisome lytic or sclerotic lesions.  IMPRESSION: 1. Interval response to therapy as evidenced by decrease in size of mediastinal and right hilar adenopathy, as well as a subcutaneous nodule along the lateral left mid abdomen. 2. Coronary artery calcification. 3. Patchy ground-glass in the left upper lobe, and possibly left lower lobe, is new and may be infectious or inflammatory in etiology. 4. Stool throughout the colon is indicative of constipation.   Electronically Signed   By: Lorin Picket M.D.   On: 08/11/2014 15:10    ASSESSMENT AND PLAN: This is a very pleasant 59 years old African-American female with:  1) Extensive stage small cell lung cancer presented with a right lung mass in the right hilar area with direct mediastinal invasion as well as metastatic soft tissue implant in the subcutaneous fat end of the left flank area and few metastatic brain lesions. She is currently undergoing systemic chemotherapy with carboplatin and etoposide status post 2 cycles. She is tolerating her treatment fairly well with no significant adverse effects. The recent CT scan of the chest, abdomen and pelvis showed significant improvement in her disease. I  discussed the scan results and showed the images to the patient today. I recommended for her to continue her current treatment with systemic chemotherapy. She will receive cycle #3 today. The patient would come back for follow-up visit in 3 weeks with the start of cycle #4.  2) metastatic brain lesions: We will continue to monitor this closely and have repeat MRI of the brain after cycle #4 and may consider the patient for whole brain irradiation at that time if she continues to have progressive disease in the brain. She was evaluated by Dr. Sondra Come.  The patient was advised to call immediately if she has any concerning symptoms in the interval. The patient voices understanding of current disease status and treatment options and is in agreement with the current care plan.  All questions were answered. The patient knows to call the clinic with any problems, questions or concerns. We can certainly see the patient much sooner if necessary.  I spent 15 minutes counseling the patient face to face. The total time spent in the appointment was 25 minutes.  Disclaimer: This note was dictated with voice recognition software. Similar sounding words can inadvertently be transcribed and may not be corrected upon review.

## 2014-08-16 NOTE — Patient Instructions (Signed)
[place] Discharge Instructions for Patients Receiving Chemotherapy  Today you received the following chemotherapy agents etoposide/carboplatin  To help prevent nausea and vomiting after your treatment, we encourage you to take your nausea medication as directed   If you develop nausea and vomiting that is not controlled by your nausea medication, call the clinic.   BELOW ARE SYMPTOMS THAT SHOULD BE REPORTED IMMEDIATELY:  *FEVER GREATER THAN 100.5 F  *CHILLS WITH OR WITHOUT FEVER  NAUSEA AND VOMITING THAT IS NOT CONTROLLED WITH YOUR NAUSEA MEDICATION  *UNUSUAL SHORTNESS OF BREATH  *UNUSUAL BRUISING OR BLEEDING  TENDERNESS IN MOUTH AND THROAT WITH OR WITHOUT PRESENCE OF ULCERS  *URINARY PROBLEMS  *BOWEL PROBLEMS  UNUSUAL RASH Items with * indicate a potential emergency and should be followed up as soon as possible.  Feel free to call the clinic you have any questions or concerns. The clinic phone number is [phone].

## 2014-08-16 NOTE — [facility] Oncology Navigator Note (Unsigned)
Spoke with patient today at [facility].  She states she is feeling well.  Dr. Julien Nordmann gave her great news regarding cancer is responding to treatment.  She is very excited.  She has no other concerns at this time.

## 2014-08-17 ENCOUNTER — Ambulatory Visit: Payer: Self-pay

## 2014-08-18 ENCOUNTER — Telehealth: Payer: Self-pay | Admitting: Internal Medicine

## 2014-08-18 ENCOUNTER — Telehealth: Payer: Self-pay | Admitting: *Deleted

## 2014-08-18 ENCOUNTER — Other Ambulatory Visit: Payer: Self-pay | Admitting: Internal Medicine

## 2014-08-18 ENCOUNTER — Ambulatory Visit (HOSPITAL_BASED_OUTPATIENT_CLINIC_OR_DEPARTMENT_OTHER): Payer: Self-pay

## 2014-08-18 DIAGNOSIS — C7931 Secondary malignant neoplasm of brain: Secondary | ICD-10-CM

## 2014-08-18 DIAGNOSIS — Z5111 Encounter for antineoplastic chemotherapy: Secondary | ICD-10-CM

## 2014-08-18 DIAGNOSIS — C3491 Malignant neoplasm of unspecified part of right bronchus or lung: Secondary | ICD-10-CM

## 2014-08-18 MED ORDER — ONDANSETRON 8 MG/NS 50 ML IVPB
INTRAVENOUS | Status: AC
  Filled 2014-08-18: qty 8

## 2014-08-18 MED ORDER — DEXAMETHASONE SODIUM PHOSPHATE 10 MG/ML IJ SOLN
INTRAMUSCULAR | Status: AC
  Filled 2014-08-18: qty 1

## 2014-08-18 MED ORDER — ONDANSETRON 8 MG/50ML IVPB (CHCC)
8.0000 mg | Freq: Once | INTRAVENOUS | Status: AC
  Administered 2014-08-18: 8 mg via INTRAVENOUS

## 2014-08-18 MED ORDER — SODIUM CHLORIDE 0.9 % IV SOLN
Freq: Once | INTRAVENOUS | Status: AC
  Administered 2014-08-18: 11:00:00 via INTRAVENOUS

## 2014-08-18 MED ORDER — DEXAMETHASONE SODIUM PHOSPHATE 10 MG/ML IJ SOLN
10.0000 mg | Freq: Once | INTRAMUSCULAR | Status: AC
  Administered 2014-08-18: 10 mg via INTRAVENOUS

## 2014-08-18 MED ORDER — ETOPOSIDE CHEMO INJECTION 1 GM/50ML
120.0000 mg/m2 | Freq: Once | INTRAVENOUS | Status: AC
  Administered 2014-08-18: 210 mg via INTRAVENOUS
  Filled 2014-08-18: qty 10.5

## 2014-08-18 NOTE — Telephone Encounter (Signed)
s.w. pt and advised that i add all Jan 2016 appts....pt will get new sched tomorrow

## 2014-08-18 NOTE — Patient Instructions (Signed)
[place] Discharge Instructions for Patients Receiving Chemotherapy  Today you received the following chemotherapy agents:  Etoposide  To help prevent nausea and vomiting after your treatment, we encourage you to take your nausea medication as ordered per MD.   If you develop nausea and vomiting that is not controlled by your nausea medication, call the clinic.   BELOW ARE SYMPTOMS THAT SHOULD BE REPORTED IMMEDIATELY:  *FEVER GREATER THAN 100.5 F  *CHILLS WITH OR WITHOUT FEVER  NAUSEA AND VOMITING THAT IS NOT CONTROLLED WITH YOUR NAUSEA MEDICATION  *UNUSUAL SHORTNESS OF BREATH  *UNUSUAL BRUISING OR BLEEDING  TENDERNESS IN MOUTH AND THROAT WITH OR WITHOUT PRESENCE OF ULCERS  *URINARY PROBLEMS  *BOWEL PROBLEMS  UNUSUAL RASH Items with * indicate a potential emergency and should be followed up as soon as possible.  Feel free to call the clinic you have any questions or concerns. The clinic phone number is [phone].

## 2014-08-18 NOTE — Telephone Encounter (Signed)
Per staff note from desk RN and pharmacy I have moved appts

## 2014-08-19 ENCOUNTER — Other Ambulatory Visit: Payer: Self-pay | Admitting: *Deleted

## 2014-08-19 ENCOUNTER — Ambulatory Visit (HOSPITAL_BASED_OUTPATIENT_CLINIC_OR_DEPARTMENT_OTHER): Payer: Self-pay

## 2014-08-19 ENCOUNTER — Ambulatory Visit: Payer: Self-pay

## 2014-08-19 DIAGNOSIS — C3400 Malignant neoplasm of unspecified main bronchus: Secondary | ICD-10-CM

## 2014-08-19 DIAGNOSIS — C7931 Secondary malignant neoplasm of brain: Secondary | ICD-10-CM

## 2014-08-19 DIAGNOSIS — Z5111 Encounter for antineoplastic chemotherapy: Secondary | ICD-10-CM

## 2014-08-19 DIAGNOSIS — C3491 Malignant neoplasm of unspecified part of right bronchus or lung: Secondary | ICD-10-CM

## 2014-08-19 MED ORDER — ONDANSETRON 8 MG/NS 50 ML IVPB
INTRAVENOUS | Status: AC
  Filled 2014-08-19: qty 8

## 2014-08-19 MED ORDER — DEXAMETHASONE SODIUM PHOSPHATE 10 MG/ML IJ SOLN
10.0000 mg | Freq: Once | INTRAMUSCULAR | Status: AC
  Administered 2014-08-19: 10 mg via INTRAVENOUS

## 2014-08-19 MED ORDER — DEXAMETHASONE SODIUM PHOSPHATE 10 MG/ML IJ SOLN
INTRAMUSCULAR | Status: AC
Start: 2014-08-19 — End: 2014-08-19
  Filled 2014-08-19: qty 1

## 2014-08-19 MED ORDER — SODIUM CHLORIDE 0.9 % IV SOLN
120.0000 mg/m2 | Freq: Once | INTRAVENOUS | Status: AC
  Administered 2014-08-19: 210 mg via INTRAVENOUS
  Filled 2014-08-19: qty 10.5

## 2014-08-19 MED ORDER — ONDANSETRON 8 MG/50ML IVPB (CHCC)
8.0000 mg | Freq: Once | INTRAVENOUS | Status: AC
  Administered 2014-08-19: 8 mg via INTRAVENOUS

## 2014-08-19 MED ORDER — SODIUM CHLORIDE 0.9 % IV SOLN
Freq: Once | INTRAVENOUS | Status: AC
  Administered 2014-08-19: 13:00:00 via INTRAVENOUS

## 2014-08-19 MED ORDER — DEXTROMETHORPHAN POLISTIREX 30 MG/5ML PO LQCR: 30.0000 mg | Freq: Two times a day (BID) | ORAL | Status: DC | PRN

## 2014-08-19 NOTE — Patient Instructions (Signed)
[place] Discharge Instructions for Patients Receiving Chemotherapy  Today you received the following chemotherapy agents Etopside  To help prevent nausea and vomiting after your treatment, we encourage you to take your nausea medication as directed.   If you develop nausea and vomiting that is not controlled by your nausea medication, call the clinic.   BELOW ARE SYMPTOMS THAT SHOULD BE REPORTED IMMEDIATELY:  *FEVER GREATER THAN 100.5 F  *CHILLS WITH OR WITHOUT FEVER  NAUSEA AND VOMITING THAT IS NOT CONTROLLED WITH YOUR NAUSEA MEDICATION  *UNUSUAL SHORTNESS OF BREATH  *UNUSUAL BRUISING OR BLEEDING  TENDERNESS IN MOUTH AND THROAT WITH OR WITHOUT PRESENCE OF ULCERS  *URINARY PROBLEMS  *BOWEL PROBLEMS  UNUSUAL RASH Items with * indicate a potential emergency and should be followed up as soon as possible.  Feel free to call the clinic you have any questions or concerns. The clinic phone number is [phone].

## 2014-08-20 ENCOUNTER — Telehealth: Payer: Self-pay | Admitting: *Deleted

## 2014-08-20 ENCOUNTER — Telehealth: Payer: Self-pay | Admitting: Internal Medicine

## 2014-08-20 ENCOUNTER — Ambulatory Visit: Payer: Self-pay

## 2014-08-20 NOTE — Telephone Encounter (Signed)
s.w pt and r/s inj per [place]....done....pt ok and aware

## 2014-08-20 NOTE — Telephone Encounter (Signed)
Pt asked to reschedule neulasta injection appt due to injection appt being too soon after chemo infusion.  Pt states she cant reschedule for today and would need to reschedule for tomorrow.  Scheduling notified of pt request.

## 2014-08-21 ENCOUNTER — Ambulatory Visit (HOSPITAL_BASED_OUTPATIENT_CLINIC_OR_DEPARTMENT_OTHER): Payer: MEDICAID

## 2014-08-21 DIAGNOSIS — C3491 Malignant neoplasm of unspecified part of right bronchus or lung: Secondary | ICD-10-CM

## 2014-08-21 DIAGNOSIS — C7931 Secondary malignant neoplasm of brain: Secondary | ICD-10-CM

## 2014-08-21 DIAGNOSIS — Z5189 Encounter for other specified aftercare: Secondary | ICD-10-CM

## 2014-08-21 MED ORDER — PEGFILGRASTIM INJECTION 6 MG/0.6ML ~~LOC~~
6.0000 mg | PREFILLED_SYRINGE | Freq: Once | SUBCUTANEOUS | Status: AC
  Administered 2014-08-21: 6 mg via SUBCUTANEOUS

## 2014-08-21 NOTE — Patient Instructions (Signed)
Pegfilgrastim injection What is this medicine? PEGFILGRASTIM (peg fil GRA stim) is a long-acting granulocyte colony-stimulating factor that stimulates the growth of neutrophils, a type of white blood cell important in the body's fight against infection. It is used to reduce the incidence of fever and infection in patients with certain types of cancer who are receiving chemotherapy that affects the bone marrow. This medicine may be used for other purposes; ask your health care provider or pharmacist if you have questions. COMMON BRAND NAME(S): Neulasta What should I tell my health care provider before I take this medicine? They need to know if you have any of these conditions: -latex allergy -ongoing radiation therapy -sickle cell disease -skin reactions to acrylic adhesives (On-Body Injector only) -an unusual or allergic reaction to pegfilgrastim, filgrastim, other medicines, foods, dyes, or preservatives -pregnant or trying to get pregnant -breast-feeding How should I use this medicine? This medicine is for injection under the skin. If you get this medicine at home, you will be taught how to prepare and give the pre-filled syringe or how to use the On-body Injector. Refer to the patient Instructions for Use for detailed instructions. Use exactly as directed. Take your medicine at regular intervals. Do not take your medicine more often than directed. It is important that you put your used needles and syringes in a special sharps container. Do not put them in a trash can. If you do not have a sharps container, call your pharmacist or healthcare provider to get one. Talk to your pediatrician regarding the use of this medicine in children. Special care may be needed. Overdosage: If you think you have taken too much of this medicine contact a poison control center or emergency room at once. NOTE: This medicine is only for you. Do not share this medicine with others. What if I miss a dose? It is  important not to miss your dose. Call your doctor or health care professional if you miss your dose. If you miss a dose due to an On-body Injector failure or leakage, a new dose should be administered as soon as possible using a single prefilled syringe for manual use. What may interact with this medicine? Interactions have not been studied. Give your health care provider a list of all the medicines, herbs, non-prescription drugs, or dietary supplements you use. Also tell them if you smoke, drink alcohol, or use illegal drugs. Some items may interact with your medicine. This list may not describe all possible interactions. Give your health care provider a list of all the medicines, herbs, non-prescription drugs, or dietary supplements you use. Also tell them if you smoke, drink alcohol, or use illegal drugs. Some items may interact with your medicine. What should I watch for while using this medicine? You may need blood work done while you are taking this medicine. If you are going to need a MRI, CT scan, or other procedure, tell your doctor that you are using this medicine (On-Body Injector only). What side effects may I notice from receiving this medicine? Side effects that you should report to your doctor or health care professional as soon as possible: -allergic reactions like skin rash, itching or hives, swelling of the face, lips, or tongue -dizziness -fever -pain, redness, or irritation at site where injected -pinpoint red spots on the skin -shortness of breath or breathing problems -stomach or side pain, or pain at the shoulder -swelling -tiredness -trouble passing urine Side effects that usually do not require medical attention (report to your doctor   or health care professional if they continue or are bothersome): -bone pain -muscle pain This list may not describe all possible side effects. Call your doctor for medical advice about side effects. You may report side effects to FDA at  1-800-FDA-1088. Where should I keep my medicine? Keep out of the reach of children. Store pre-filled syringes in a refrigerator between 2 and 8 degrees C (36 and 46 degrees F). Do not freeze. Keep in carton to protect from light. Throw away this medicine if it is left out of the refrigerator for more than 48 hours. Throw away any unused medicine after the expiration date. NOTE: This sheet is a summary. It may not cover all possible information. If you have questions about this medicine, talk to your doctor, pharmacist, or health care provider.  2015, Elsevier/Gold Standard. (2013-10-29 16:14:05)  

## 2014-08-23 ENCOUNTER — Other Ambulatory Visit: Payer: Self-pay

## 2014-08-27 ENCOUNTER — Other Ambulatory Visit: Payer: Self-pay | Admitting: *Deleted

## 2014-08-27 DIAGNOSIS — C3490 Malignant neoplasm of unspecified part of unspecified bronchus or lung: Secondary | ICD-10-CM

## 2014-08-27 MED ORDER — OXYCODONE-ACETAMINOPHEN 5-325 MG PO TABS: 1.0000 | ORAL_TABLET | Freq: Four times a day (QID) | ORAL | Status: DC | PRN

## 2014-08-30 ENCOUNTER — Other Ambulatory Visit (HOSPITAL_BASED_OUTPATIENT_CLINIC_OR_DEPARTMENT_OTHER): Payer: Self-pay

## 2014-08-30 DIAGNOSIS — C7931 Secondary malignant neoplasm of brain: Secondary | ICD-10-CM

## 2014-08-30 DIAGNOSIS — C3491 Malignant neoplasm of unspecified part of right bronchus or lung: Secondary | ICD-10-CM

## 2014-08-30 LAB — CBC WITH DIFFERENTIAL/PLATELET
BASO%: 1.6 % (ref 0.0–2.0)
Basophils Absolute: 0.5 10*3/uL — ABNORMAL HIGH (ref 0.0–0.1)
EOS%: 0.1 % (ref 0.0–7.0)
Eosinophils Absolute: 0 10*3/uL (ref 0.0–0.5)
HCT: 26.9 % — ABNORMAL LOW (ref 34.8–46.6)
HEMOGLOBIN: 9.2 g/dL — AB (ref 11.6–15.9)
LYMPH#: 6.6 10*3/uL — AB (ref 0.9–3.3)
LYMPH%: 20.8 % (ref 14.0–49.7)
MCH: 32.1 pg (ref 25.1–34.0)
MCHC: 34.2 g/dL (ref 31.5–36.0)
MCV: 93.7 fL (ref 79.5–101.0)
MONO#: 1.5 10*3/uL — ABNORMAL HIGH (ref 0.1–0.9)
MONO%: 4.7 % (ref 0.0–14.0)
NEUT#: 23 10*3/uL — ABNORMAL HIGH (ref 1.5–6.5)
NEUT%: 72.8 % (ref 38.4–76.8)
Platelets: 105 10*3/uL — ABNORMAL LOW (ref 145–400)
RBC: 2.87 10*6/uL — ABNORMAL LOW (ref 3.70–5.45)
RDW: 20.6 % — ABNORMAL HIGH (ref 11.2–14.5)
WBC: 31.6 10*3/uL — ABNORMAL HIGH (ref 3.9–10.3)
nRBC: 5 % — ABNORMAL HIGH (ref 0–0)

## 2014-08-30 LAB — COMPREHENSIVE METABOLIC PANEL (CC13)
ALT: 91 U/L — AB (ref 0–55)
ANION GAP: 13 meq/L — AB (ref 3–11)
AST: 64 U/L — AB (ref 5–34)
Albumin: 2.8 g/dL — ABNORMAL LOW (ref 3.5–5.0)
Alkaline Phosphatase: 152 U/L — ABNORMAL HIGH (ref 40–150)
BUN: 10.8 mg/dL (ref 7.0–26.0)
CALCIUM: 8.8 mg/dL (ref 8.4–10.4)
CHLORIDE: 104 meq/L (ref 98–109)
CO2: 25 meq/L (ref 22–29)
CREATININE: 0.6 mg/dL (ref 0.6–1.1)
Glucose: 128 mg/dl (ref 70–140)
Potassium: 2.6 mEq/L — CL (ref 3.5–5.1)
Sodium: 142 mEq/L (ref 136–145)
TOTAL PROTEIN: 6 g/dL — AB (ref 6.4–8.3)
Total Bilirubin: <0.2 mg/dL (ref 0.20–1.20)

## 2014-08-31 ENCOUNTER — Telehealth: Payer: Self-pay | Admitting: *Deleted

## 2014-08-31 DIAGNOSIS — C3490 Malignant neoplasm of unspecified part of unspecified bronchus or lung: Secondary | ICD-10-CM

## 2014-08-31 MED ORDER — POTASSIUM CHLORIDE CRYS ER 20 MEQ PO TBCR: 40.0000 meq | EXTENDED_RELEASE_TABLET | ORAL | Status: DC

## 2014-08-31 NOTE — Progress Notes (Signed)
Quick Note:  Call patient with the result and order K Dur 40 meq po qd X 10 days ______

## 2014-08-31 NOTE — Telephone Encounter (Signed)
Called and left msg with instructions for k-dur 29meq x 10 days

## 2014-08-31 NOTE — Telephone Encounter (Signed)
-----   Message from Curt Bears, MD sent at 08/31/2014 12:17 AM EST ----- Call patient with the result and order K Dur 40 meq po qd X 10 days

## 2014-09-03 ENCOUNTER — Other Ambulatory Visit: Payer: Self-pay | Admitting: Physician Assistant

## 2014-09-03 DIAGNOSIS — E876 Hypokalemia: Secondary | ICD-10-CM

## 2014-09-03 MED ORDER — POTASSIUM CHLORIDE CRYS ER 20 MEQ PO TBCR: 40.0000 meq | EXTENDED_RELEASE_TABLET | Freq: Two times a day (BID) | ORAL | Status: DC

## 2014-09-03 NOTE — Progress Notes (Signed)
Called patient made her aware of her low potassium. Patient had not received potassium prescription. She states that she was using the [facility] with prescription for potassium chloride 40 mEq by mouth daily for 10 days. Patient states that she would be unable to get the prescription until Monday, 09/06/2014. I encouraged the patient to need a lot of of potassium-rich foods over the weekend. She voiced understanding.  Wynetta Emery, Westlee Devita E , PA-C

## 2014-09-06 ENCOUNTER — Ambulatory Visit (HOSPITAL_COMMUNITY)
Admission: RE | Admit: 2014-09-06 | Discharge: 2014-09-06 | Disposition: A | Discharge status: Home / Self Care | Payer: Medicaid Other | Source: Ambulatory Visit | Attending: Physician Assistant | Admitting: Physician Assistant

## 2014-09-06 ENCOUNTER — Encounter: Payer: Self-pay | Admitting: Physician Assistant

## 2014-09-06 ENCOUNTER — Ambulatory Visit (HOSPITAL_BASED_OUTPATIENT_CLINIC_OR_DEPARTMENT_OTHER): Payer: Self-pay | Admitting: Physician Assistant

## 2014-09-06 ENCOUNTER — Ambulatory Visit: Payer: Self-pay

## 2014-09-06 ENCOUNTER — Other Ambulatory Visit (HOSPITAL_BASED_OUTPATIENT_CLINIC_OR_DEPARTMENT_OTHER): Payer: Self-pay

## 2014-09-06 ENCOUNTER — Telehealth: Payer: Self-pay | Admitting: Physician Assistant

## 2014-09-06 VITALS — BP 141/85 | HR 109 | Temp 98.4°F | Resp 18 | Ht 64.0 in | Wt 160.3 lb

## 2014-09-06 DIAGNOSIS — C3491 Malignant neoplasm of unspecified part of right bronchus or lung: Secondary | ICD-10-CM

## 2014-09-06 DIAGNOSIS — C7989 Secondary malignant neoplasm of other specified sites: Secondary | ICD-10-CM

## 2014-09-06 DIAGNOSIS — R059 Cough, unspecified: Secondary | ICD-10-CM

## 2014-09-06 DIAGNOSIS — R079 Chest pain, unspecified: Secondary | ICD-10-CM | POA: Diagnosis not present

## 2014-09-06 DIAGNOSIS — C7931 Secondary malignant neoplasm of brain: Secondary | ICD-10-CM

## 2014-09-06 DIAGNOSIS — C3400 Malignant neoplasm of unspecified main bronchus: Secondary | ICD-10-CM

## 2014-09-06 DIAGNOSIS — R05 Cough: Secondary | ICD-10-CM | POA: Insufficient documentation

## 2014-09-06 DIAGNOSIS — C3492 Malignant neoplasm of unspecified part of left bronchus or lung: Secondary | ICD-10-CM | POA: Insufficient documentation

## 2014-09-06 DIAGNOSIS — C3490 Malignant neoplasm of unspecified part of unspecified bronchus or lung: Secondary | ICD-10-CM

## 2014-09-06 LAB — CBC WITH DIFFERENTIAL/PLATELET
BASO%: 0.3 % (ref 0.0–2.0)
Basophils Absolute: 0.1 10*3/uL (ref 0.0–0.1)
EOS ABS: 0 10*3/uL (ref 0.0–0.5)
EOS%: 0 % (ref 0.0–7.0)
HEMATOCRIT: 33.3 % — AB (ref 34.8–46.6)
HEMOGLOBIN: 10.5 g/dL — AB (ref 11.6–15.9)
LYMPH%: 21.3 % (ref 14.0–49.7)
MCH: 31.6 pg (ref 25.1–34.0)
MCHC: 31.5 g/dL (ref 31.5–36.0)
MCV: 100.3 fL (ref 79.5–101.0)
MONO#: 1.4 10*3/uL — AB (ref 0.1–0.9)
MONO%: 6.4 % (ref 0.0–14.0)
NEUT#: 15.6 10*3/uL — ABNORMAL HIGH (ref 1.5–6.5)
NEUT%: 72 % (ref 38.4–76.8)
Platelets: 232 10*3/uL (ref 145–400)
RBC: 3.32 10*6/uL — ABNORMAL LOW (ref 3.70–5.45)
RDW: 25 % — ABNORMAL HIGH (ref 11.2–14.5)
WBC: 21.7 10*3/uL — AB (ref 3.9–10.3)
lymph#: 4.6 10*3/uL — ABNORMAL HIGH (ref 0.9–3.3)
nRBC: 2 % — ABNORMAL HIGH (ref 0–0)

## 2014-09-06 LAB — COMPREHENSIVE METABOLIC PANEL (CC13)
ALK PHOS: 125 U/L (ref 40–150)
ALT: 57 U/L — AB (ref 0–55)
ANION GAP: 12 meq/L — AB (ref 3–11)
AST: 23 U/L (ref 5–34)
Albumin: 2.8 g/dL — ABNORMAL LOW (ref 3.5–5.0)
BUN: 12.7 mg/dL (ref 7.0–26.0)
CALCIUM: 9.1 mg/dL (ref 8.4–10.4)
CHLORIDE: 105 meq/L (ref 98–109)
CO2: 25 meq/L (ref 22–29)
CREATININE: 0.6 mg/dL (ref 0.6–1.1)
EGFR: >90 mL/min/{1.73_m2} (ref >90)
Glucose: 192 mg/dl — ABNORMAL HIGH (ref 70–140)
POTASSIUM: 4 meq/L (ref 3.5–5.1)
Sodium: 142 mEq/L (ref 136–145)
Total Bilirubin: 0.2 mg/dL (ref 0.20–1.20)
Total Protein: 6.1 g/dL — ABNORMAL LOW (ref 6.4–8.3)

## 2014-09-06 MED ORDER — OXYCODONE-ACETAMINOPHEN 5-325 MG PO TABS: 1.0000 | ORAL_TABLET | Freq: Four times a day (QID) | ORAL | Status: DC | PRN

## 2014-09-06 MED ORDER — DEXTROMETHORPHAN POLISTIREX 30 MG/5ML PO LQCR: 30.0000 mg | Freq: Two times a day (BID) | ORAL | Status: DC | PRN

## 2014-09-06 MED ORDER — ONDANSETRON HCL 8 MG PO TABS: 8.0000 mg | ORAL_TABLET | Freq: Three times a day (TID) | ORAL | Status: DC | PRN

## 2014-09-06 MED ORDER — LEVOFLOXACIN 500 MG PO TABS: 500.0000 mg | ORAL_TABLET | Freq: Every day | ORAL | Status: DC

## 2014-09-06 NOTE — Telephone Encounter (Signed)
Gave avs & calendar for February. Sent message to schedule treatment. °

## 2014-09-06 NOTE — Progress Notes (Signed)
[facility] [phone]   [phone]  OFFICE PROGRESS NOTE  No PCP Per Patient No address on file  DIAGNOSIS: Extensive stage small cell lung cancer diagnosed in November 2015 presented with large right hilar mass with direct mediastinal invasion in addition to mediastinal lymphadenopathy and metastatic lesion in the subcutaneous fat of the left flank.  PRIOR THERAPY: None  CURRENT THERAPY: Systemic chemotherapy with carboplatin for AUC of 5 on day 1 and etoposide 120 MG/M2 on days 1, 2 and 3 with Neulasta support on day 4. First cycle on 07/05/2014. Status post 3 cycles.  INTERVAL HISTORY: [facility] 59 y.o. female returns to the clinic today for follow-up visit. She complains of right shoulder pain radiating through to her chest with a nonproductive cough that started over the weekend. She rates the pain as a 10 on a 0-10 scale. She reports that the pain is similar to when she had pneumonia before and she is concerned that she has pneumonia again. She denied any fever or chills. She requests a refill for her Percocet, Zofran and cough syrup. The patient is tolerating her systemic chemotherapy with carboplatin and etoposide fairly well with no significant adverse effects. She denied having any significant nausea or vomiting.  She denied having any significant chest pain and continues to have improvement in her shortness of breath with no hemoptysis. The patient denied having any significant weight loss or night sweats. She had repeat CT scan of the chest, abdomen and pelvis performed recently and she is here for evaluation and discussion of her scan results.  MEDICAL HISTORY: Past Medical History  Diagnosis Date  . Brain metastases 06/21/2014    ALLERGIES:  has No Known Allergies.  MEDICATIONS:  Current Outpatient Prescriptions  Medication Sig Dispense Refill  . aspirin 81 MG tablet Take 81 mg by mouth daily as needed for pain (chest pain).     Marland Kitchen dexamethasone (DECADRON) 4 MG tablet Take 1 tablet (4 mg total) by mouth 2 (two) times daily. 60 tablet 0  . dextromethorphan (DELSYM) 30 MG/5ML liquid Take 5 mLs (30 mg total) by mouth every 12 (twelve) hours as needed for cough. 500 mL 1  . ipratropium-albuterol (DUONEB) 0.5-2.5 (3) MG/3ML SOLN Take 3 mLs by nebulization every 6 (six) hours as needed. 360 mL 1  . levofloxacin (LEVAQUIN) 500 MG tablet Take 1 tablet (500 mg total) by mouth daily. 7 tablet 0  . Multiple Vitamins-Minerals (MULTIVITAMIN WITH MINERALS) tablet Take 1 tablet by mouth daily.    . ondansetron (ZOFRAN) 8 MG tablet Take 1 tablet (8 mg total) by mouth every 8 (eight) hours as needed for nausea or vomiting. 30 tablet 0  . oxyCODONE-acetaminophen (PERCOCET) 5-325 MG per tablet Take 1 tablet by mouth every 6 (six) hours as needed. 30 tablet 0  . potassium chloride SA (K-DUR,KLOR-CON) 20 MEQ tablet Take 2 tablets (40 mEq total) by mouth as directed. Take 34meq x 10 days 20 tablet 0  . potassium chloride SA (K-DUR,KLOR-CON) 20 MEQ tablet Take 2 tablets (40 mEq total) by mouth 2 (two) times daily. 20 tablet 0  . prochlorperazine (COMPAZINE) 10 MG tablet Take 1 tablet (10 mg total) by mouth every 6 (six) hours as needed for nausea or vomiting. 30 tablet 0  . levofloxacin (LEVAQUIN) 500 MG tablet Take 1 tablet (500 mg total) by mouth daily. 5 tablet 0   No current facility-administered medications for this visit.    SURGICAL HISTORY: History  reviewed. No pertinent past surgical history.  REVIEW OF SYSTEMS:  Constitutional: negative Eyes: negative Ears, nose, mouth, throat, and face: negative Respiratory: positive for cough and dyspnea on exertion Cardiovascular: negative Gastrointestinal: negative Genitourinary:negative Integument/breast: negative Hematologic/lymphatic: negative Musculoskeletal:negative Neurological: negative Behavioral/Psych: negative Endocrine: negative Allergic/Immunologic: negative   PHYSICAL  EXAMINATION: General appearance: alert, cooperative and no distress Head: Normocephalic, without obvious abnormality, atraumatic Neck: no adenopathy, no JVD, supple, symmetrical, trachea midline and thyroid not enlarged, symmetric, no tenderness/mass/nodules Lymph nodes: Cervical, supraclavicular, and axillary nodes normal. Resp: clear to auscultation bilaterally Back: symmetric, no curvature. ROM normal. No CVA tenderness. Cardio: regular rate and rhythm, S1, S2 normal, no murmur, click, rub or gallop GI: soft, non-tender; bowel sounds normal; no masses,  no organomegaly Extremities: extremities normal, atraumatic, no cyanosis or edema Neurologic: Alert and oriented X 3, normal strength and tone. Normal symmetric reflexes. Normal coordination and gait  ECOG PERFORMANCE STATUS: 1 - Symptomatic but completely ambulatory  Blood pressure 141/85, pulse 109, temperature 98.4 F (36.9 C), temperature source Oral, resp. rate 18, height 5\' 4"  (1.626 m), weight 160 lb 4.8 oz (72.712 kg), SpO2 97 %.  LABORATORY DATA: Lab Results  Component Value Date   WBC 21.7* 09/06/2014   HGB 10.5* 09/06/2014   HCT 33.3* 09/06/2014   MCV 100.3 09/06/2014   PLT 232 09/06/2014      Chemistry      Component Value Date/Time   NA 142 09/06/2014 1144   NA 136* 07/31/2014 0542   K 4.0 09/06/2014 1144   K 4.5 07/31/2014 0542   CL 94* 07/31/2014 0542   CO2 25 09/06/2014 1144   CO2 25 07/31/2014 0542   BUN 12.7 09/06/2014 1144   BUN 10 07/31/2014 0542   CREATININE 0.6 09/06/2014 1144   CREATININE 0.41* 07/31/2014 0542      Component Value Date/Time   CALCIUM 9.1 09/06/2014 1144   CALCIUM 9.8 07/31/2014 0542   ALKPHOS 125 09/06/2014 1144   AST 23 09/06/2014 1144   ALT 57* 09/06/2014 1144   BILITOT 0.20 09/06/2014 1144       RADIOGRAPHIC STUDIES: Dg Chest 2 View  09/06/2014   CLINICAL DATA:  New cough of 3 days duration. Right upper chest pain. Lung carcinoma, metastatic to brain.  EXAM: CHEST  2  VIEW  COMPARISON:  07/29/2014.  CT 08/11/2014  FINDINGS: Central right lung mass with narrowed interlobar bronchus and with ipsilateral mediastinal adenopathy, probably unchanged from the 08/11/2014 CT. The lungs are clear except for accentuated markings in the bases which likely relate to the shallow degree of inspiration. No effusions are evident.  IMPRESSION: Neoplastic changes in the thorax are probably not significantly different from 08/11/2014. No superimposed acute finding is evident.   Electronically Signed   By: Andreas Newport M.D.   On: 09/06/2014 13:20   Ct Chest W Contrast  08/11/2014   CLINICAL DATA:  Small cell lung cancer with ongoing chemotherapy. Mild chest pain.  EXAM: CT CHEST, ABDOMEN, AND PELVIS WITH CONTRAST  TECHNIQUE: Multidetector CT imaging of the chest, abdomen and pelvis was performed following the standard protocol during bolus administration of intravenous contrast.  CONTRAST:  17mL OMNIPAQUE IOHEXOL 300 MG/ML SOLN, 174mL OMNIPAQUE IOHEXOL 300 MG/ML SOLN  COMPARISON:  PET 06/18/2014, CT chest 06/01/2014 and CT abdomen pelvis 08/21/2007.  FINDINGS: CT CHEST FINDINGS  Necrotic appearing mediastinal adenopathy measures up to 2.2 x 2.5 cm in the lower right paratracheal station (previously 3.1 x 3.1 cm). Right hilar mass is contiguous with  subcarinal adenopathy and collectively, measures 5.0 x 6.1 cm (previously 7.2 x 7.5 cm). There is mass effect on the adjacent right pulmonary artery right-sided bronchi.  Heart size normal. Coronary artery calcification. No pericardial effusion.  Centrilobular emphysema. Image quality is degraded by respiratory motion. Patchy ground-glass in the left upper lobe, and possibly left lower lobe, is new. Mild volume loss in the right upper lobe. No pleural fluid.  CT ABDOMEN AND PELVIS FINDINGS  Hepatobiliary: The liver and gallbladder are unremarkable. No biliary ductal dilatation.  Pancreas: Negative.  Spleen: Negative.  Adrenals/Urinary Tract:  Adrenal glands and right kidney are unremarkable. Fluid density lesion in the upper pole left kidney measures 3.3 cm, stable. Ureters are decompressed. Bladder is unremarkable.  Stomach/Bowel: Stomach, small bowel and appendix are unremarkable. Stool is seen throughout the colon.  Vascular/Lymphatic: Atherosclerotic calcification of the arterial vasculature without abdominal aortic aneurysm. No pathologically enlarged lymph nodes.  Reproductive: Uterus and ovaries are visualized.  Other: A subcutaneous nodule along the left mid abdomen measures 1.5 x 1.9 cm (series 2, image 74), previously 2.1 x 2.4 cm on 06/18/2014. No definite free fluid. Mesenteries and peritoneum are unremarkable.  Musculoskeletal: No worrisome lytic or sclerotic lesions.  IMPRESSION: 1. Interval response to therapy as evidenced by decrease in size of mediastinal and right hilar adenopathy, as well as a subcutaneous nodule along the lateral left mid abdomen. 2. Coronary artery calcification. 3. Patchy ground-glass in the left upper lobe, and possibly left lower lobe, is new and may be infectious or inflammatory in etiology. 4. Stool throughout the colon is indicative of constipation.   Electronically Signed   By: Lorin Picket M.D.   On: 08/11/2014 15:10   Ct Abdomen Pelvis W Contrast  08/11/2014   CLINICAL DATA:  Small cell lung cancer with ongoing chemotherapy. Mild chest pain.  EXAM: CT CHEST, ABDOMEN, AND PELVIS WITH CONTRAST  TECHNIQUE: Multidetector CT imaging of the chest, abdomen and pelvis was performed following the standard protocol during bolus administration of intravenous contrast.  CONTRAST:  21mL OMNIPAQUE IOHEXOL 300 MG/ML SOLN, 163mL OMNIPAQUE IOHEXOL 300 MG/ML SOLN  COMPARISON:  PET 06/18/2014, CT chest 06/01/2014 and CT abdomen pelvis 08/21/2007.  FINDINGS: CT CHEST FINDINGS  Necrotic appearing mediastinal adenopathy measures up to 2.2 x 2.5 cm in the lower right paratracheal station (previously 3.1 x 3.1 cm). Right  hilar mass is contiguous with subcarinal adenopathy and collectively, measures 5.0 x 6.1 cm (previously 7.2 x 7.5 cm). There is mass effect on the adjacent right pulmonary artery right-sided bronchi.  Heart size normal. Coronary artery calcification. No pericardial effusion.  Centrilobular emphysema. Image quality is degraded by respiratory motion. Patchy ground-glass in the left upper lobe, and possibly left lower lobe, is new. Mild volume loss in the right upper lobe. No pleural fluid.  CT ABDOMEN AND PELVIS FINDINGS  Hepatobiliary: The liver and gallbladder are unremarkable. No biliary ductal dilatation.  Pancreas: Negative.  Spleen: Negative.  Adrenals/Urinary Tract: Adrenal glands and right kidney are unremarkable. Fluid density lesion in the upper pole left kidney measures 3.3 cm, stable. Ureters are decompressed. Bladder is unremarkable.  Stomach/Bowel: Stomach, small bowel and appendix are unremarkable. Stool is seen throughout the colon.  Vascular/Lymphatic: Atherosclerotic calcification of the arterial vasculature without abdominal aortic aneurysm. No pathologically enlarged lymph nodes.  Reproductive: Uterus and ovaries are visualized.  Other: A subcutaneous nodule along the left mid abdomen measures 1.5 x 1.9 cm (series 2, image 74), previously 2.1 x 2.4 cm on 06/18/2014. No definite  free fluid. Mesenteries and peritoneum are unremarkable.  Musculoskeletal: No worrisome lytic or sclerotic lesions.  IMPRESSION: 1. Interval response to therapy as evidenced by decrease in size of mediastinal and right hilar adenopathy, as well as a subcutaneous nodule along the lateral left mid abdomen. 2. Coronary artery calcification. 3. Patchy ground-glass in the left upper lobe, and possibly left lower lobe, is new and may be infectious or inflammatory in etiology. 4. Stool throughout the colon is indicative of constipation.   Electronically Signed   By: Lorin Picket M.D.   On: 08/11/2014 15:10    ASSESSMENT AND  PLAN: This is a very pleasant 59 years old African-American female with:  1) Extensive stage small cell lung cancer presented with a right lung mass in the right hilar area with direct mediastinal invasion as well as metastatic soft tissue implant in the subcutaneous fat end of the left flank area and few metastatic brain lesions. She is currently undergoing systemic chemotherapy with carboplatin and etoposide status post 2 cycles. She is tolerating her treatment fairly well with no significant adverse effects. The recent CT scan of the chest, abdomen and pelvis showed significant improvement in her disease. I discussed the scan results and showed the images to the patient today. I recommended for her to continue her current treatment with systemic chemotherapy. Her chest x-ray was negative for pneumonia. Her symptoms are concerning for bronchitis and as such we will place her on a 5 day course of Levaquin 500 mg by mouth daily for the next 5 days. This prescription as well as a refill prescription for her Delsym cough syrup was sent to her pharmacy of record via E scribed. She was given a refill prescription for her Percocet 5/325 mg tablets a total of 30 with no refill. Her Zofran was also refilled. We will postpone cycle #4 x 1 week and she will return in 4 weeks prior to the start of cycle #5 with a restaging CT scan of the chest, abdomen and pelvis with contrast to reevaluate her disease.   2) metastatic brain lesions: We will continue to monitor this closely. She will be scheduled for a repeat MRI of the brain after cycle #4 and may consider the patient for whole brain irradiation at that time if she continues to have progressive disease in the brain. She was evaluated by Dr. Sondra Come.  The patient was advised to call immediately if she has any concerning symptoms in the interval. The patient voices understanding of current disease status and treatment options and is in agreement with the current care  plan.  All questions were answered. The patient knows to call the clinic with any problems, questions or concerns. We can certainly see the patient much sooner if necessary.  Carlton Adam, PA-C    Disclaimer: This note was dictated with voice recognition software. Similar sounding words can inadvertently be transcribed and may not be corrected upon review.   ADDENDUM: Hematology/Oncology Attending: I had a face to face encounter with the patient today. I recommended her care plan. This is a very pleasant 59 years old African-American female with extensive stage small cell lung cancer who is currently undergoing systemic chemotherapy with carboplatin and etoposide status post 3 cycles and tolerating her treatment fairly well. The patient presented today with cough and chest congestion but no significant fever or chills. She was supposed to start cycle #4 of her chemotherapy today. We will recommended for the patient to have chest x-ray performed which showed  no acute abnormalities.  We will start the patient on Levaquin 500 mg by mouth daily for 5 days. We will delay her chemotherapy by 1 week to give her more time to recover from the questionable acute bronchitis. She would come back for follow-up visit in one week for reevaluation before starting the fourth cycle of her treatment. The patient was advised to call immediately if she has any concerning symptoms in the interval.  Disclaimer: This note was dictated with voice recognition software. Similar sounding words can inadvertently be transcribed and may be missed upon review.  Eilleen Kempf., MD 09/06/2014

## 2014-09-07 ENCOUNTER — Telehealth: Payer: Self-pay | Admitting: *Deleted

## 2014-09-07 ENCOUNTER — Ambulatory Visit: Payer: Self-pay

## 2014-09-07 NOTE — Telephone Encounter (Signed)
Per staff message and POF I have scheduled appts. Advised scheduler of appts and first available appts given . JMW

## 2014-09-07 NOTE — Patient Instructions (Signed)
Take the antibiotic as prescribed for your bronchitis Return in 1 week for you chemotherapy. Continue weekly labs Follow up in 4 weeks with a restaging CT scan of your chest, abdomen and pelvis and MRI of your brain to re-evaluate your disease

## 2014-09-08 ENCOUNTER — Ambulatory Visit: Payer: Self-pay

## 2014-09-09 ENCOUNTER — Ambulatory Visit: Payer: Self-pay

## 2014-09-10 ENCOUNTER — Ambulatory Visit: Payer: Self-pay

## 2014-09-13 ENCOUNTER — Telehealth: Payer: Self-pay | Admitting: *Deleted

## 2014-09-13 ENCOUNTER — Telehealth: Payer: Self-pay | Admitting: Internal Medicine

## 2014-09-13 ENCOUNTER — Other Ambulatory Visit: Payer: Self-pay

## 2014-09-13 ENCOUNTER — Telehealth: Payer: Self-pay | Admitting: Medical Oncology

## 2014-09-13 ENCOUNTER — Ambulatory Visit: Payer: Self-pay

## 2014-09-13 NOTE — Telephone Encounter (Signed)
Per staff message and POF I have scheduled appts. Advised scheduler of appts. JMW  

## 2014-09-13 NOTE — Telephone Encounter (Signed)
Confirm appointments for 02/02. Patient will get new calendar at that appointment. Sent mesage to schedule treatment.

## 2014-09-13 NOTE — Telephone Encounter (Signed)
PT FORGOT HER CHEMO APPT TODAY . SHE SAID SHE WILL COME BACK TOMORROW . I TOLD HER I NEED TO CHECK WITH SCHEDULER TO SEE IF THERE IS ENOUGH TIME SCHEDULED.

## 2014-09-14 ENCOUNTER — Telehealth: Payer: Self-pay | Admitting: Medical Oncology

## 2014-09-14 ENCOUNTER — Telehealth: Payer: Self-pay | Admitting: Internal Medicine

## 2014-09-14 ENCOUNTER — Other Ambulatory Visit: Payer: Medicaid Other

## 2014-09-14 ENCOUNTER — Ambulatory Visit: Payer: Self-pay

## 2014-09-14 NOTE — Telephone Encounter (Signed)
Spoke with patient to inform that are treatment area has no later appointment and will needs labs done befroe treatment. Patient confirmed.

## 2014-09-14 NOTE — Telephone Encounter (Signed)
Pt could not get transportation today.Onc rtx requests sent to r/s for wed, Thursday , Friday and sat

## 2014-09-15 ENCOUNTER — Other Ambulatory Visit: Payer: Self-pay | Admitting: *Deleted

## 2014-09-15 ENCOUNTER — Ambulatory Visit (HOSPITAL_BASED_OUTPATIENT_CLINIC_OR_DEPARTMENT_OTHER): Payer: Medicaid Other

## 2014-09-15 ENCOUNTER — Other Ambulatory Visit (HOSPITAL_BASED_OUTPATIENT_CLINIC_OR_DEPARTMENT_OTHER): Payer: Medicaid Other

## 2014-09-15 ENCOUNTER — Other Ambulatory Visit: Payer: Medicaid Other

## 2014-09-15 DIAGNOSIS — Z5111 Encounter for antineoplastic chemotherapy: Secondary | ICD-10-CM | POA: Diagnosis present

## 2014-09-15 DIAGNOSIS — C3490 Malignant neoplasm of unspecified part of unspecified bronchus or lung: Secondary | ICD-10-CM

## 2014-09-15 DIAGNOSIS — C3491 Malignant neoplasm of unspecified part of right bronchus or lung: Secondary | ICD-10-CM

## 2014-09-15 DIAGNOSIS — C7931 Secondary malignant neoplasm of brain: Secondary | ICD-10-CM

## 2014-09-15 LAB — COMPREHENSIVE METABOLIC PANEL (CC13)
ALBUMIN: 2.9 g/dL — AB (ref 3.5–5.0)
ALT: 41 U/L (ref 0–55)
AST: 21 U/L (ref 5–34)
Alkaline Phosphatase: 106 U/L (ref 40–150)
Anion Gap: 13 mEq/L — ABNORMAL HIGH (ref 3–11)
BUN: 15.9 mg/dL (ref 7.0–26.0)
CO2: 19 meq/L — AB (ref 22–29)
Calcium: 8.5 mg/dL (ref 8.4–10.4)
Chloride: 108 mEq/L (ref 98–109)
Creatinine: 0.7 mg/dL (ref 0.6–1.1)
EGFR: >90 mL/min/{1.73_m2} (ref >90)
Glucose: 275 mg/dl — ABNORMAL HIGH (ref 70–140)
POTASSIUM: 3.9 meq/L (ref 3.5–5.1)
SODIUM: 139 meq/L (ref 136–145)
Total Protein: 6.3 g/dL — ABNORMAL LOW (ref 6.4–8.3)

## 2014-09-15 LAB — CBC WITH DIFFERENTIAL/PLATELET
BASO%: 0.8 % (ref 0.0–2.0)
Basophils Absolute: 0.1 10*3/uL (ref 0.0–0.1)
EOS%: 0.2 % (ref 0.0–7.0)
Eosinophils Absolute: 0 10*3/uL (ref 0.0–0.5)
HCT: 32 % — ABNORMAL LOW (ref 34.8–46.6)
HEMOGLOBIN: 10.2 g/dL — AB (ref 11.6–15.9)
LYMPH%: 21.7 % (ref 14.0–49.7)
MCH: 32.5 pg (ref 25.1–34.0)
MCHC: 31.8 g/dL (ref 31.5–36.0)
MCV: 102.3 fL — ABNORMAL HIGH (ref 79.5–101.0)
MONO#: 1 10*3/uL — ABNORMAL HIGH (ref 0.1–0.9)
MONO%: 6.9 % (ref 0.0–14.0)
NEUT%: 70.4 % (ref 38.4–76.8)
NEUTROS ABS: 9.8 10*3/uL — AB (ref 1.5–6.5)
Platelets: 309 10*3/uL (ref 145–400)
RBC: 3.12 10*6/uL — ABNORMAL LOW (ref 3.70–5.45)
RDW: 23.8 % — ABNORMAL HIGH (ref 11.2–14.5)
WBC: 13.9 10*3/uL — ABNORMAL HIGH (ref 3.9–10.3)
lymph#: 3 10*3/uL (ref 0.9–3.3)

## 2014-09-15 MED ORDER — SODIUM CHLORIDE 0.9 % IV SOLN
554.5000 mg | Freq: Once | INTRAVENOUS | Status: AC
  Administered 2014-09-15: 550 mg via INTRAVENOUS
  Filled 2014-09-15: qty 55

## 2014-09-15 MED ORDER — SODIUM CHLORIDE 0.9 % IV SOLN
Freq: Once | INTRAVENOUS | Status: AC
  Administered 2014-09-15: 09:00:00 via INTRAVENOUS

## 2014-09-15 MED ORDER — DEXAMETHASONE SODIUM PHOSPHATE 20 MG/5ML IJ SOLN
20.0000 mg | Freq: Once | INTRAMUSCULAR | Status: AC
  Administered 2014-09-15: 20 mg via INTRAVENOUS

## 2014-09-15 MED ORDER — DEXAMETHASONE 4 MG PO TABS: 4.0000 mg | ORAL_TABLET | ORAL | Status: DC

## 2014-09-15 MED ORDER — ONDANSETRON 16 MG/50ML IVPB (CHCC)
INTRAVENOUS | Status: AC
  Filled 2014-09-15: qty 16

## 2014-09-15 MED ORDER — ONDANSETRON 16 MG/50ML IVPB (CHCC)
16.0000 mg | Freq: Once | INTRAVENOUS | Status: AC
  Administered 2014-09-15: 16 mg via INTRAVENOUS

## 2014-09-15 MED ORDER — SODIUM CHLORIDE 0.9 % IV SOLN
120.0000 mg/m2 | Freq: Once | INTRAVENOUS | Status: AC
  Administered 2014-09-15: 210 mg via INTRAVENOUS
  Filled 2014-09-15: qty 10.5

## 2014-09-15 MED ORDER — DEXAMETHASONE SODIUM PHOSPHATE 20 MG/5ML IJ SOLN
INTRAMUSCULAR | Status: AC
  Filled 2014-09-15: qty 5

## 2014-09-15 MED ORDER — OXYCODONE-ACETAMINOPHEN 5-325 MG PO TABS: 1.0000 | ORAL_TABLET | Freq: Four times a day (QID) | ORAL | Status: DC | PRN

## 2014-09-15 NOTE — Patient Instructions (Signed)
[facility] Discharge Instructions for Patients Receiving Chemotherapy  Today you received the following chemotherapy agents Carboplatin/Etoposide.   To help prevent nausea and vomiting after your treatment, we encourage you to take your nausea medication as directed.    If you develop nausea and vomiting that is not controlled by your nausea medication, call the clinic.   BELOW ARE SYMPTOMS THAT SHOULD BE REPORTED IMMEDIATELY:  *FEVER GREATER THAN 100.5 F  *CHILLS WITH OR WITHOUT FEVER  NAUSEA AND VOMITING THAT IS NOT CONTROLLED WITH YOUR NAUSEA MEDICATION  *UNUSUAL SHORTNESS OF BREATH  *UNUSUAL BRUISING OR BLEEDING  TENDERNESS IN MOUTH AND THROAT WITH OR WITHOUT PRESENCE OF ULCERS  *URINARY PROBLEMS  *BOWEL PROBLEMS  UNUSUAL RASH Items with * indicate a potential emergency and should be followed up as soon as possible.  Feel free to call the clinic you have any questions or concerns. The clinic phone number is [phone].

## 2014-09-16 ENCOUNTER — Ambulatory Visit: Payer: Self-pay

## 2014-09-16 ENCOUNTER — Ambulatory Visit (HOSPITAL_BASED_OUTPATIENT_CLINIC_OR_DEPARTMENT_OTHER): Payer: Medicaid Other

## 2014-09-16 DIAGNOSIS — C7931 Secondary malignant neoplasm of brain: Secondary | ICD-10-CM

## 2014-09-16 DIAGNOSIS — C3491 Malignant neoplasm of unspecified part of right bronchus or lung: Secondary | ICD-10-CM

## 2014-09-16 DIAGNOSIS — Z5111 Encounter for antineoplastic chemotherapy: Secondary | ICD-10-CM

## 2014-09-16 MED ORDER — ONDANSETRON 8 MG/50ML IVPB (CHCC)
8.0000 mg | Freq: Once | INTRAVENOUS | Status: AC
  Administered 2014-09-16: 8 mg via INTRAVENOUS

## 2014-09-16 MED ORDER — ONDANSETRON 8 MG/NS 50 ML IVPB
INTRAVENOUS | Status: AC
  Filled 2014-09-16: qty 8

## 2014-09-16 MED ORDER — DEXAMETHASONE SODIUM PHOSPHATE 10 MG/ML IJ SOLN
10.0000 mg | Freq: Once | INTRAMUSCULAR | Status: AC
  Administered 2014-09-16: 10 mg via INTRAVENOUS

## 2014-09-16 MED ORDER — SODIUM CHLORIDE 0.9 % IV SOLN
120.0000 mg/m2 | Freq: Once | INTRAVENOUS | Status: AC
  Administered 2014-09-16: 210 mg via INTRAVENOUS
  Filled 2014-09-16: qty 10.5

## 2014-09-16 MED ORDER — SODIUM CHLORIDE 0.9 % IV SOLN
Freq: Once | INTRAVENOUS | Status: AC
  Administered 2014-09-16: 16:00:00 via INTRAVENOUS

## 2014-09-16 MED ORDER — DEXAMETHASONE SODIUM PHOSPHATE 10 MG/ML IJ SOLN
INTRAMUSCULAR | Status: AC
  Filled 2014-09-16: qty 1

## 2014-09-16 NOTE — Patient Instructions (Signed)
[facility] Discharge Instructions for Patients Receiving Chemotherapy  Today you received the following chemotherapy agents etoposide  To help prevent nausea and vomiting after your treatment, we encourage you to take your nausea medication as directed   If you develop nausea and vomiting that is not controlled by your nausea medication, call the clinic.   BELOW ARE SYMPTOMS THAT SHOULD BE REPORTED IMMEDIATELY:  *FEVER GREATER THAN 100.5 F  *CHILLS WITH OR WITHOUT FEVER  NAUSEA AND VOMITING THAT IS NOT CONTROLLED WITH YOUR NAUSEA MEDICATION  *UNUSUAL SHORTNESS OF BREATH  *UNUSUAL BRUISING OR BLEEDING  TENDERNESS IN MOUTH AND THROAT WITH OR WITHOUT PRESENCE OF ULCERS  *URINARY PROBLEMS  *BOWEL PROBLEMS  UNUSUAL RASH Items with * indicate a potential emergency and should be followed up as soon as possible.  Feel free to call the clinic you have any questions or concerns. The clinic phone number is [phone].

## 2014-09-17 ENCOUNTER — Ambulatory Visit (HOSPITAL_BASED_OUTPATIENT_CLINIC_OR_DEPARTMENT_OTHER): Payer: Medicaid Other

## 2014-09-17 ENCOUNTER — Ambulatory Visit: Payer: Self-pay

## 2014-09-17 ENCOUNTER — Other Ambulatory Visit: Payer: Self-pay | Admitting: *Deleted

## 2014-09-17 DIAGNOSIS — Z5111 Encounter for antineoplastic chemotherapy: Secondary | ICD-10-CM

## 2014-09-17 DIAGNOSIS — C3491 Malignant neoplasm of unspecified part of right bronchus or lung: Secondary | ICD-10-CM

## 2014-09-17 DIAGNOSIS — C7931 Secondary malignant neoplasm of brain: Secondary | ICD-10-CM

## 2014-09-17 MED ORDER — SODIUM CHLORIDE 0.9 % IV SOLN
Freq: Once | INTRAVENOUS | Status: AC
  Administered 2014-09-17: 14:00:00 via INTRAVENOUS

## 2014-09-17 MED ORDER — ONDANSETRON 8 MG/50ML IVPB (CHCC)
8.0000 mg | Freq: Once | INTRAVENOUS | Status: AC
  Administered 2014-09-17: 8 mg via INTRAVENOUS

## 2014-09-17 MED ORDER — DEXAMETHASONE SODIUM PHOSPHATE 10 MG/ML IJ SOLN
10.0000 mg | Freq: Once | INTRAMUSCULAR | Status: AC
  Administered 2014-09-17: 10 mg via INTRAVENOUS

## 2014-09-17 MED ORDER — OXYCODONE-ACETAMINOPHEN 5-325 MG PO TABS
ORAL_TABLET | ORAL | Status: AC
  Filled 2014-09-17: qty 1

## 2014-09-17 MED ORDER — SODIUM CHLORIDE 0.9 % IV SOLN
120.0000 mg/m2 | Freq: Once | INTRAVENOUS | Status: AC
  Administered 2014-09-17: 210 mg via INTRAVENOUS
  Filled 2014-09-17: qty 10.5

## 2014-09-17 MED ORDER — DEXAMETHASONE SODIUM PHOSPHATE 10 MG/ML IJ SOLN
INTRAMUSCULAR | Status: AC
  Filled 2014-09-17: qty 1

## 2014-09-17 MED ORDER — OXYCODONE-ACETAMINOPHEN 5-325 MG PO TABS
1.0000 | ORAL_TABLET | Freq: Once | ORAL | Status: AC
  Administered 2014-09-17: 1 via ORAL

## 2014-09-17 MED ORDER — ONDANSETRON 8 MG/NS 50 ML IVPB
INTRAVENOUS | Status: AC
  Filled 2014-09-17: qty 8

## 2014-09-17 NOTE — Patient Instructions (Signed)
[facility] Discharge Instructions for Patients Receiving Chemotherapy  Today you received the following chemotherapy agents Etoposide  To help prevent nausea and vomiting after your treatment, we encourage you to take your nausea medication as directed.   If you develop nausea and vomiting that is not controlled by your nausea medication, call the clinic.   BELOW ARE SYMPTOMS THAT SHOULD BE REPORTED IMMEDIATELY:  *FEVER GREATER THAN 100.5 F  *CHILLS WITH OR WITHOUT FEVER  NAUSEA AND VOMITING THAT IS NOT CONTROLLED WITH YOUR NAUSEA MEDICATION  *UNUSUAL SHORTNESS OF BREATH  *UNUSUAL BRUISING OR BLEEDING  TENDERNESS IN MOUTH AND THROAT WITH OR WITHOUT PRESENCE OF ULCERS  *URINARY PROBLEMS  *BOWEL PROBLEMS  UNUSUAL RASH Items with * indicate a potential emergency and should be followed up as soon as possible.  Feel free to call the clinic you have any questions or concerns. The clinic phone number is [phone].

## 2014-09-18 ENCOUNTER — Ambulatory Visit: Payer: Self-pay

## 2014-09-20 ENCOUNTER — Telehealth: Payer: Self-pay | Admitting: *Deleted

## 2014-09-20 ENCOUNTER — Ambulatory Visit: Payer: Self-pay

## 2014-09-20 ENCOUNTER — Other Ambulatory Visit: Payer: Self-pay

## 2014-09-20 NOTE — Telephone Encounter (Signed)
Opened by mistake.

## 2014-09-21 ENCOUNTER — Ambulatory Visit (HOSPITAL_BASED_OUTPATIENT_CLINIC_OR_DEPARTMENT_OTHER): Payer: Medicaid Other

## 2014-09-21 ENCOUNTER — Telehealth: Payer: Self-pay | Admitting: Internal Medicine

## 2014-09-21 ENCOUNTER — Telehealth: Payer: Self-pay | Admitting: *Deleted

## 2014-09-21 DIAGNOSIS — C3491 Malignant neoplasm of unspecified part of right bronchus or lung: Secondary | ICD-10-CM

## 2014-09-21 LAB — COMPREHENSIVE METABOLIC PANEL (CC13)
ALT: 43 U/L (ref 0–55)
ANION GAP: 11 meq/L (ref 3–11)
AST: 18 U/L (ref 5–34)
Albumin: 3.1 g/dL — ABNORMAL LOW (ref 3.5–5.0)
Alkaline Phosphatase: 100 U/L (ref 40–150)
BUN: 15.4 mg/dL (ref 7.0–26.0)
CALCIUM: 8.9 mg/dL (ref 8.4–10.4)
CO2: 23 meq/L (ref 22–29)
Chloride: 106 mEq/L (ref 98–109)
Creatinine: 0.6 mg/dL (ref 0.6–1.1)
EGFR: >90 mL/min/{1.73_m2} (ref >90)
Glucose: 141 mg/dl — ABNORMAL HIGH (ref 70–140)
Potassium: 4.2 mEq/L (ref 3.5–5.1)
SODIUM: 140 meq/L (ref 136–145)
TOTAL PROTEIN: 6.1 g/dL — AB (ref 6.4–8.3)
Total Bilirubin: <0.2 mg/dL (ref 0.20–1.20)

## 2014-09-21 LAB — CBC WITH DIFFERENTIAL/PLATELET
BASO%: 0.1 % (ref 0.0–2.0)
Basophils Absolute: 0 10*3/uL (ref 0.0–0.1)
EOS%: 0.5 % (ref 0.0–7.0)
Eosinophils Absolute: 0.1 10*3/uL (ref 0.0–0.5)
HCT: 32.2 % — ABNORMAL LOW (ref 34.8–46.6)
HGB: 10.4 g/dL — ABNORMAL LOW (ref 11.6–15.9)
LYMPH#: 2.7 10*3/uL (ref 0.9–3.3)
LYMPH%: 29.2 % (ref 14.0–49.7)
MCH: 33.2 pg (ref 25.1–34.0)
MCHC: 32.3 g/dL (ref 31.5–36.0)
MCV: 102.9 fL — AB (ref 79.5–101.0)
MONO#: 0 10*3/uL — ABNORMAL LOW (ref 0.1–0.9)
MONO%: 0.3 % (ref 0.0–14.0)
NEUT%: 69.9 % (ref 38.4–76.8)
NEUTROS ABS: 6.5 10*3/uL (ref 1.5–6.5)
Platelets: 237 10*3/uL (ref 145–400)
RBC: 3.13 10*6/uL — AB (ref 3.70–5.45)
RDW: 20.3 % — AB (ref 11.2–14.5)
WBC: 9.3 10*3/uL (ref 3.9–10.3)

## 2014-09-21 NOTE — Telephone Encounter (Signed)
Per staff message and POF I have scheduled appts. Advised scheduler of appts. JMW  

## 2014-09-21 NOTE — Telephone Encounter (Signed)
Patient came in for Injection missed on 02/08. Verified w. RN/[facility] to not do the injection due to insurance might not cover due to being after the time to receive Neulesta.Will have her weekly labs done. Gave patient new calendar. Sent mesage to schedule treatment.

## 2014-09-27 ENCOUNTER — Other Ambulatory Visit (HOSPITAL_BASED_OUTPATIENT_CLINIC_OR_DEPARTMENT_OTHER): Payer: Medicaid Other

## 2014-09-27 ENCOUNTER — Telehealth: Payer: Self-pay | Admitting: *Deleted

## 2014-09-27 DIAGNOSIS — C7931 Secondary malignant neoplasm of brain: Secondary | ICD-10-CM

## 2014-09-27 DIAGNOSIS — C3491 Malignant neoplasm of unspecified part of right bronchus or lung: Secondary | ICD-10-CM

## 2014-09-27 LAB — COMPREHENSIVE METABOLIC PANEL (CC13)
ALK PHOS: 84 U/L (ref 40–150)
ALT: 67 U/L — ABNORMAL HIGH (ref 0–55)
AST: 39 U/L — ABNORMAL HIGH (ref 5–34)
Albumin: 2.7 g/dL — ABNORMAL LOW (ref 3.5–5.0)
Anion Gap: 12 mEq/L — ABNORMAL HIGH (ref 3–11)
BUN: 13.7 mg/dL (ref 7.0–26.0)
CO2: 22 meq/L (ref 22–29)
CREATININE: 0.6 mg/dL (ref 0.6–1.1)
Calcium: 9.9 mg/dL (ref 8.4–10.4)
Chloride: 105 mEq/L (ref 98–109)
EGFR: >90 mL/min/{1.73_m2} (ref >90)
Glucose: 184 mg/dl — ABNORMAL HIGH (ref 70–140)
Potassium: 4.3 mEq/L (ref 3.5–5.1)
SODIUM: 139 meq/L (ref 136–145)
Total Bilirubin: <0.2 mg/dL (ref 0.20–1.20)
Total Protein: 6.9 g/dL (ref 6.4–8.3)

## 2014-09-27 LAB — CBC WITH DIFFERENTIAL/PLATELET
BASO%: 0.4 % (ref 0.0–2.0)
Basophils Absolute: 0 10*3/uL (ref 0.0–0.1)
EOS%: 0.2 % (ref 0.0–7.0)
Eosinophils Absolute: 0 10*3/uL (ref 0.0–0.5)
HEMATOCRIT: 29.6 % — AB (ref 34.8–46.6)
HEMOGLOBIN: 9.4 g/dL — AB (ref 11.6–15.9)
LYMPH%: 65.8 % — ABNORMAL HIGH (ref 14.0–49.7)
MCH: 31.9 pg (ref 25.1–34.0)
MCHC: 31.9 g/dL (ref 31.5–36.0)
MCV: 99.8 fL (ref 79.5–101.0)
MONO#: 0.2 10*3/uL (ref 0.1–0.9)
MONO%: 11.4 % (ref 0.0–14.0)
NEUT%: 22.2 % — ABNORMAL LOW (ref 38.4–76.8)
NEUTROS ABS: 0.5 10*3/uL — AB (ref 1.5–6.5)
PLATELETS: 126 10*3/uL — AB (ref 145–400)
RBC: 2.96 10*6/uL — AB (ref 3.70–5.45)
RDW: 18.5 % — ABNORMAL HIGH (ref 11.2–14.5)
WBC: 2 10*3/uL — ABNORMAL LOW (ref 3.9–10.3)
lymph#: 1.3 10*3/uL (ref 0.9–3.3)

## 2014-09-27 LAB — TECHNOLOGIST REVIEW

## 2014-09-27 NOTE — Telephone Encounter (Signed)
Called patient to review neutropenic precautions using [facility] neutropenic instructions due to ANC-0.5. Patient voices understanding.

## 2014-09-28 ENCOUNTER — Telehealth: Payer: Self-pay

## 2014-09-28 DIAGNOSIS — C3490 Malignant neoplasm of unspecified part of unspecified bronchus or lung: Secondary | ICD-10-CM

## 2014-09-28 MED ORDER — OXYCODONE-ACETAMINOPHEN 5-325 MG PO TABS: 1.0000 | ORAL_TABLET | Freq: Four times a day (QID) | ORAL | Status: DC | PRN

## 2014-09-28 NOTE — Telephone Encounter (Signed)
S/w pt her pain rx is ready for pickup. Called Wayna Chalet who said the pt was discharged from the congregational nurse program now. A family member will have to pick up the rx. Bring a drivers license with them.

## 2014-09-28 NOTE — Telephone Encounter (Signed)
Family member called in for percocet refill. Pt uses about 2/day. Asked [place] to call Solmon Ice Hoax her congregational nurse when ready for pickup [phone] cell, [phone] work. Rx prepared for signature.

## 2014-09-29 ENCOUNTER — Telehealth: Payer: Self-pay | Admitting: *Deleted

## 2014-09-29 NOTE — Telephone Encounter (Signed)
Received call from patient's son, Shanon Ace, inquiring about patient's next appointments.  Let him know that patient is scheduled for a CT scan and MRI this Friday and then a lab and office visit scheduled for Monday. While on the phone, I asked Rashane to check with his mom to see if she has the contrast at home to drink prior to the scan. Per patient, she does not have it and both her and her son do not have transportation to come and get it today or tomorrow. Discussed situation with Colletta Maryland, RN - patient can come to [facility] Friday morning at 8am before scan and pick up the contrast and drink it here in the lobby.  Discussed this with patient and she is agreeable to plan. Told her to arrive at [facility] at [place] and to see the schedulers who will give her the contrast to drink one bottle at 8:30 and another at 9:30. Also reminded patient that she cannot have anything to eat or drink after 6:30am - patient agreeable to this.

## 2014-10-01 ENCOUNTER — Encounter (HOSPITAL_COMMUNITY): Payer: Self-pay

## 2014-10-01 ENCOUNTER — Other Ambulatory Visit: Payer: Self-pay | Admitting: Physician Assistant

## 2014-10-01 ENCOUNTER — Telehealth: Payer: Self-pay | Admitting: *Deleted

## 2014-10-01 ENCOUNTER — Ambulatory Visit (HOSPITAL_COMMUNITY)
Admission: RE | Admit: 2014-10-01 | Discharge: 2014-10-01 | Disposition: A | Discharge status: Home / Self Care | Payer: Medicaid Other | Source: Ambulatory Visit | Attending: Physician Assistant | Admitting: Physician Assistant

## 2014-10-01 ENCOUNTER — Telehealth: Payer: Self-pay

## 2014-10-01 ENCOUNTER — Ambulatory Visit (HOSPITAL_COMMUNITY): Payer: Medicaid Other

## 2014-10-01 DIAGNOSIS — C3492 Malignant neoplasm of unspecified part of left bronchus or lung: Secondary | ICD-10-CM | POA: Diagnosis present

## 2014-10-01 DIAGNOSIS — Z79899 Other long term (current) drug therapy: Secondary | ICD-10-CM | POA: Insufficient documentation

## 2014-10-01 DIAGNOSIS — Z87891 Personal history of nicotine dependence: Secondary | ICD-10-CM | POA: Insufficient documentation

## 2014-10-01 MED ORDER — IOHEXOL 300 MG/ML  SOLN: 100.0000 mL | Freq: Once | INTRAMUSCULAR | Status: AC | PRN

## 2014-10-01 MED ORDER — IOHEXOL 300 MG/ML  SOLN
50.0000 mL | Freq: Once | INTRAMUSCULAR | Status: AC | PRN
  Administered 2014-10-01: 50 mL via ORAL

## 2014-10-01 NOTE — Telephone Encounter (Signed)
Opal Sidles from [facility] [phone]) called with stat results on the CT CAP = acute diverticulitis sigmoid colon, no abscess or perforation.  States report is in [place].  Routed to Dr Julien Nordmann

## 2014-10-01 NOTE — Telephone Encounter (Signed)
THIS REPORT WAS GIVEN TO ADRENA JOHNSON,PA.

## 2014-10-01 NOTE — Telephone Encounter (Signed)
VERBAL ORDER AND READ BACK TO [facility]- CALL PT. FOR AN UPDATE. ALSO MAKE DR.SHADAD (THE ON CALL PHYSICIAN) AWARE OF PT.'S CT CHEST, ABDOMEN, AND PELVIS REPORT. CALLED PT. SHE IS HAVING "A LITTLE PAIN" IN HER CHEST WHICH IS RELIEVED WITH HER PAIN MEDICATION. NO NAUSEA OR VOMITING. A GOOD BOWEL MOVEMENT THIS MORNING. DR.SHADAD WAS SHOWN PT.'S CT CHEST, ABDOMEN, AND PELVIS REPORT AND WAS GIVEN PT.'S UPDATE. NO NEW ORDERS GIVEN.

## 2014-10-04 ENCOUNTER — Other Ambulatory Visit: Payer: Self-pay | Admitting: Medical Oncology

## 2014-10-04 ENCOUNTER — Telehealth: Payer: Self-pay | Admitting: Physician Assistant

## 2014-10-04 ENCOUNTER — Ambulatory Visit (HOSPITAL_BASED_OUTPATIENT_CLINIC_OR_DEPARTMENT_OTHER): Payer: Medicaid Other

## 2014-10-04 ENCOUNTER — Encounter: Payer: Self-pay | Admitting: Physician Assistant

## 2014-10-04 ENCOUNTER — Other Ambulatory Visit: Payer: Self-pay | Admitting: *Deleted

## 2014-10-04 ENCOUNTER — Telehealth: Payer: Self-pay | Admitting: *Deleted

## 2014-10-04 ENCOUNTER — Other Ambulatory Visit (HOSPITAL_BASED_OUTPATIENT_CLINIC_OR_DEPARTMENT_OTHER): Payer: Medicaid Other

## 2014-10-04 ENCOUNTER — Ambulatory Visit (HOSPITAL_BASED_OUTPATIENT_CLINIC_OR_DEPARTMENT_OTHER): Payer: Medicaid Other | Admitting: Physician Assistant

## 2014-10-04 VITALS — BP 122/72 | HR 96 | Temp 97.8°F | Resp 18 | Ht 64.0 in | Wt 166.0 lb

## 2014-10-04 DIAGNOSIS — R918 Other nonspecific abnormal finding of lung field: Secondary | ICD-10-CM

## 2014-10-04 DIAGNOSIS — C3490 Malignant neoplasm of unspecified part of unspecified bronchus or lung: Secondary | ICD-10-CM

## 2014-10-04 DIAGNOSIS — C3400 Malignant neoplasm of unspecified main bronchus: Secondary | ICD-10-CM

## 2014-10-04 DIAGNOSIS — C7931 Secondary malignant neoplasm of brain: Secondary | ICD-10-CM

## 2014-10-04 DIAGNOSIS — Z5111 Encounter for antineoplastic chemotherapy: Secondary | ICD-10-CM

## 2014-10-04 DIAGNOSIS — C3491 Malignant neoplasm of unspecified part of right bronchus or lung: Secondary | ICD-10-CM

## 2014-10-04 LAB — CBC WITH DIFFERENTIAL/PLATELET
BASO%: 1.5 % (ref 0.0–2.0)
BASOS ABS: 0.3 10*3/uL — AB (ref 0.0–0.1)
EOS%: 0.1 % (ref 0.0–7.0)
Eosinophils Absolute: 0 10*3/uL (ref 0.0–0.5)
HCT: 31.1 % — ABNORMAL LOW (ref 34.8–46.6)
HGB: 9.8 g/dL — ABNORMAL LOW (ref 11.6–15.9)
LYMPH#: 4.4 10*3/uL — AB (ref 0.9–3.3)
LYMPH%: 24.3 % (ref 14.0–49.7)
MCH: 32.1 pg (ref 25.1–34.0)
MCHC: 31.5 g/dL (ref 31.5–36.0)
MCV: 102 fL — ABNORMAL HIGH (ref 79.5–101.0)
MONO#: 1.5 10*3/uL — ABNORMAL HIGH (ref 0.1–0.9)
MONO%: 8.3 % (ref 0.0–14.0)
NEUT%: 65.8 % (ref 38.4–76.8)
NEUTROS ABS: 12 10*3/uL — AB (ref 1.5–6.5)
NRBC: 6 % — AB (ref 0–0)
Platelets: 184 10*3/uL (ref 145–400)
RBC: 3.05 10*6/uL — ABNORMAL LOW (ref 3.70–5.45)
RDW: 18.1 % — AB (ref 11.2–14.5)
WBC: 18.3 10*3/uL — ABNORMAL HIGH (ref 3.9–10.3)

## 2014-10-04 LAB — COMPREHENSIVE METABOLIC PANEL (CC13)
ALK PHOS: 89 U/L (ref 40–150)
ALT: 51 U/L (ref 0–55)
ANION GAP: 10 meq/L (ref 3–11)
AST: 25 U/L (ref 5–34)
Albumin: 2.7 g/dL — ABNORMAL LOW (ref 3.5–5.0)
BUN: 10.3 mg/dL (ref 7.0–26.0)
CHLORIDE: 104 meq/L (ref 98–109)
CO2: 28 mEq/L (ref 22–29)
Calcium: 9.1 mg/dL (ref 8.4–10.4)
Creatinine: 0.6 mg/dL (ref 0.6–1.1)
EGFR: >90 mL/min/{1.73_m2} (ref >90)
GLUCOSE: 88 mg/dL (ref 70–140)
POTASSIUM: 4 meq/L (ref 3.5–5.1)
Sodium: 143 mEq/L (ref 136–145)
Total Protein: 5.7 g/dL — ABNORMAL LOW (ref 6.4–8.3)

## 2014-10-04 LAB — TECHNOLOGIST REVIEW: Technologist Review: 2

## 2014-10-04 MED ORDER — PROCHLORPERAZINE MALEATE 10 MG PO TABS: 10.0000 mg | ORAL_TABLET | Freq: Four times a day (QID) | ORAL | Status: DC | PRN

## 2014-10-04 MED ORDER — OXYCODONE-ACETAMINOPHEN 5-325 MG PO TABS: 2.0000 | ORAL_TABLET | ORAL | Status: DC

## 2014-10-04 MED ORDER — DEXTROMETHORPHAN POLISTIREX 30 MG/5ML PO LQCR: 30.0000 mg | Freq: Two times a day (BID) | ORAL | Status: DC | PRN

## 2014-10-04 MED ORDER — DEXAMETHASONE SODIUM PHOSPHATE 20 MG/5ML IJ SOLN
20.0000 mg | Freq: Once | INTRAMUSCULAR | Status: AC
  Administered 2014-10-04: 20 mg via INTRAVENOUS

## 2014-10-04 MED ORDER — SODIUM CHLORIDE 0.9 % IV SOLN
120.0000 mg/m2 | Freq: Once | INTRAVENOUS | Status: AC
  Administered 2014-10-04: 210 mg via INTRAVENOUS
  Filled 2014-10-04: qty 10.5

## 2014-10-04 MED ORDER — OXYCODONE-ACETAMINOPHEN 5-325 MG PO TABS
2.0000 | ORAL_TABLET | ORAL | Status: AC
  Administered 2014-10-04: 2 via ORAL

## 2014-10-04 MED ORDER — ONDANSETRON 16 MG/50ML IVPB (CHCC)
16.0000 mg | Freq: Once | INTRAVENOUS | Status: AC
  Administered 2014-10-04: 16 mg via INTRAVENOUS

## 2014-10-04 MED ORDER — CARBOPLATIN CHEMO INJECTION 600 MG/60ML
554.5000 mg | Freq: Once | INTRAVENOUS | Status: AC
  Administered 2014-10-04: 550 mg via INTRAVENOUS
  Filled 2014-10-04: qty 55

## 2014-10-04 MED ORDER — OXYCODONE-ACETAMINOPHEN 5-325 MG PO TABS: 1.0000 | ORAL_TABLET | Freq: Four times a day (QID) | ORAL | Status: DC | PRN

## 2014-10-04 MED ORDER — ONDANSETRON HCL 8 MG PO TABS: 8.0000 mg | ORAL_TABLET | Freq: Three times a day (TID) | ORAL | Status: DC | PRN

## 2014-10-04 MED ORDER — DEXAMETHASONE SODIUM PHOSPHATE 20 MG/5ML IJ SOLN
INTRAMUSCULAR | Status: AC
  Filled 2014-10-04: qty 5

## 2014-10-04 MED ORDER — ONDANSETRON 16 MG/50ML IVPB (CHCC)
INTRAVENOUS | Status: AC
  Filled 2014-10-04: qty 16

## 2014-10-04 MED ORDER — OXYCODONE-ACETAMINOPHEN 5-325 MG PO TABS
ORAL_TABLET | ORAL | Status: AC
  Filled 2014-10-04: qty 2

## 2014-10-04 MED ORDER — SODIUM CHLORIDE 0.9 % IV SOLN
Freq: Once | INTRAVENOUS | Status: AC
  Administered 2014-10-04: 13:00:00 via INTRAVENOUS

## 2014-10-04 NOTE — Telephone Encounter (Signed)
Per staff message and POF I have scheduled appts. Advised scheduler of appts. JMW  

## 2014-10-04 NOTE — Telephone Encounter (Signed)
Gave asv & calendar for March. Sent message to schedule treatment.

## 2014-10-04 NOTE — Progress Notes (Addendum)
[name] [phone]   [phone]  OFFICE PROGRESS NOTE  No PCP Per Patient No address on file  DIAGNOSIS: Extensive stage small cell lung cancer diagnosed in November 2015 presented with large right hilar mass with direct mediastinal invasion in addition to mediastinal lymphadenopathy and metastatic lesion in the subcutaneous fat of the left flank.  PRIOR THERAPY: None  CURRENT THERAPY: Systemic chemotherapy with carboplatin for AUC of 5 on day 1 and etoposide 120 MG/M2 on days 1, 2 and 3 with Neulasta support on day 4. First cycle on 07/05/2014. Status post 4 cycles.  INTERVAL HISTORY: Mariah Park 59 y.o. female returns to the clinic today for follow-up visit.  She denied any fever or chills.  The patient is tolerating her systemic chemotherapy with carboplatin and etoposide fairly well with no significant adverse effects. She denied having any significant nausea or vomiting.  She denied having any significant chest pain and continues to have improvement in her shortness of breath with no hemoptysis. The patient denied having any significant weight loss or night sweats. She denied any GI symptomatology, denied fever or chills. She had repeat CT scan of the chest, abdomen and pelvis performed recently and she is here for evaluation and discussion of her scan results.  MEDICAL HISTORY: Past Medical History  Diagnosis Date  . Brain metastases 06/21/2014    ALLERGIES:  has No Known Allergies.  MEDICATIONS:  Current Outpatient Prescriptions  Medication Sig Dispense Refill  . aspirin 81 MG tablet Take 81 mg by mouth daily as needed for pain (chest pain).    Marland Kitchen dexamethasone (DECADRON) 4 MG tablet Take 1 tablet (4 mg total) by mouth as directed. 2 tabs BID the day before, day of, and  day after chemo 60 tablet 0  . ipratropium-albuterol (DUONEB) 0.5-2.5 (3) MG/3ML SOLN Take 3 mLs by nebulization every 6 (six) hours as needed. 360 mL 1  .  levofloxacin (LEVAQUIN) 500 MG tablet Take 1 tablet (500 mg total) by mouth daily. 5 tablet 0  . Multiple Vitamins-Minerals (MULTIVITAMIN WITH MINERALS) tablet Take 1 tablet by mouth daily.    Marland Kitchen oxyCODONE-acetaminophen (PERCOCET) 5-325 MG per tablet Take 1 tablet by mouth every 6 (six) hours as needed. 30 tablet 0  . potassium chloride SA (K-DUR,KLOR-CON) 20 MEQ tablet Take 2 tablets (40 mEq total) by mouth as directed. Take 1meq x 10 days 20 tablet 0  . potassium chloride SA (K-DUR,KLOR-CON) 20 MEQ tablet Take 2 tablets (40 mEq total) by mouth 2 (two) times daily. 20 tablet 0  . dextromethorphan (DELSYM) 30 MG/5ML liquid Take 5 mLs (30 mg total) by mouth every 12 (twelve) hours as needed for cough. 500 mL 1  . levofloxacin (LEVAQUIN) 500 MG tablet Take 1 tablet (500 mg total) by mouth daily. (Patient not taking: Reported on 10/04/2014) 7 tablet 0  . ondansetron (ZOFRAN) 8 MG tablet Take 1 tablet (8 mg total) by mouth every 8 (eight) hours as needed for nausea or vomiting. 30 tablet 0  . prochlorperazine (COMPAZINE) 10 MG tablet Take 1 tablet (10 mg total) by mouth every 6 (six) hours as needed for nausea or vomiting. 30 tablet 0   No current facility-administered medications for this visit.    SURGICAL HISTORY: History reviewed. No pertinent past surgical history.  REVIEW OF SYSTEMS:  Constitutional: negative Eyes: negative Ears, nose, mouth, throat, and face: negative Respiratory: positive for cough and dyspnea on exertion Cardiovascular: negative Gastrointestinal: negative Genitourinary:negative Integument/breast:  negative Hematologic/lymphatic: negative Musculoskeletal:negative Neurological: negative Behavioral/Psych: negative Endocrine: negative Allergic/Immunologic: negative   PHYSICAL EXAMINATION: General appearance: alert, cooperative and no distress Head: Normocephalic, without obvious abnormality, atraumatic Neck: no adenopathy, no JVD, supple, symmetrical, trachea midline  and thyroid not enlarged, symmetric, no tenderness/mass/nodules Lymph nodes: Cervical, supraclavicular, and axillary nodes normal. Resp: clear to auscultation bilaterally Back: symmetric, no curvature. ROM normal. No CVA tenderness. Cardio: regular rate and rhythm, S1, S2 normal, no murmur, click, rub or gallop GI: soft, non-tender; bowel sounds normal; no masses,  no organomegaly Extremities: extremities normal, atraumatic, no cyanosis or edema Neurologic: Alert and oriented X 3, normal strength and tone. Normal symmetric reflexes. Normal coordination and gait  ECOG PERFORMANCE STATUS: 1 - Symptomatic but completely ambulatory  Blood pressure 122/72, pulse 96, temperature 97.8 F (36.6 C), temperature source Oral, resp. rate 18, height 5\' 4"  (1.626 m), weight 166 lb (75.297 kg), SpO2 98 %.  LABORATORY DATA: Lab Results  Component Value Date   WBC 18.3* 10/04/2014   HGB 9.8* 10/04/2014   HCT 31.1* 10/04/2014   MCV 102.0* 10/04/2014   PLT 184 10/04/2014      Chemistry      Component Value Date/Time   NA 143 10/04/2014 1037   NA 136* 07/31/2014 0542   K 4.0 10/04/2014 1037   K 4.5 07/31/2014 0542   CL 94* 07/31/2014 0542   CO2 28 10/04/2014 1037   CO2 25 07/31/2014 0542   BUN 10.3 10/04/2014 1037   BUN 10 07/31/2014 0542   CREATININE 0.6 10/04/2014 1037   CREATININE 0.41* 07/31/2014 0542      Component Value Date/Time   CALCIUM 9.1 10/04/2014 1037   CALCIUM 9.8 07/31/2014 0542   ALKPHOS 89 10/04/2014 1037   AST 25 10/04/2014 1037   ALT 51 10/04/2014 1037   BILITOT <0.20 10/04/2014 1037       RADIOGRAPHIC STUDIES: Ct Abdomen Pelvis Wo Contrast  10/01/2014   CLINICAL DATA:  Restaging extensive stage small cell lung cancer. Lung ca dx 10/15-ongoing chemo, mild CP, small cell quit smoking 10/15 after smoking 30 years. Per Dr. Ilda Foil do scan without contrast- no IV access  EXAM: CT CHEST, ABDOMEN AND PELVIS WITHOUT CONTRAST  TECHNIQUE: Multidetector CT imaging of the  chest, abdomen and pelvis was performed following the standard protocol without IV contrast.  COMPARISON:  08/11/2014  FINDINGS: CT CHEST FINDINGS  Exam done without intravenous contrast due to no IV access.  Lungs are adequately inflated with resolution of the previous noted patchy ground-glass attenuation over the left upper lower lobe. There is mild centrilobular emphysema. Mild linear atelectasis/scarring over the right midlung unchanged. No evidence of pleural effusion. Stable 4 mm subpleural nodule over the posterior left lower lobe. Stable subpleural 1 2 mm nodule over the lateral right upper lobe. No new nodules.  Slight decrease in size of the previously noted necrotic upper right peritracheal node measuring 1.7 x 1.9 cm (previously 2.2 x 2.5 cm). Previously noted right hilar mass continuous with subcarinal adenopathy measures approximately 4.8 x 5.4 cm in total (previously 6.1 x 5 cm). There is continued moderate mass effect on the adjacent right bronchus. No definite left hilar adenopathy and no evidence of axillary adenopathy. Heart remaining mediastinal structures are unchanged.  CT ABDOMEN AND PELVIS FINDINGS  Abdominal images demonstrate the liver, spleen, pancreas, gallbladder and adrenal glands to be within normal. Kidney is normal in size without hydronephrosis or nephrolithiasis. Stable 2.6 cm left renal cyst. Mild atherosclerotic plaque of the abdominal  aorta and iliac arteries. Appendix is normal. There is diverticulosis of the colon. There is no free fluid or adenopathy.  There is inflammatory change within the pericolonic fat adjacent a short segment of the sigmoid colon in the left lower quadrant with prominent associated diverticula as findings suggest acute diverticulitis. No evidence of perforation or abscess. Stable subcutaneous nodule over the left flank measuring 1.4 x 1.8 cm.  Pelvic images demonstrate the uterus, ovaries, bladder and rectum to be within normal. There are degenerative  changes of the spine.  IMPRESSION: Continued positive response to ongoing chemotherapy with decrease in size of mediastinal adenopathy as well as the right hilar/mediastinal mass. Stable subcutaneous nodule over the left flank. Two stable subcentimeter pulmonary nodules as described.  Diverticulosis of the colon with inflammatory change adjacent a short segment of the sigmoid colon in the left lower quadrant concerning for acute diverticulitis. No abscess or perforation.  Stable subcutaneous metastatic nodule over the left flank.  Mild centrilobular emphysema and stable scarring/ atelectasis over the right midlung. Atherosclerotic coronary artery disease.  Stable 2.6 cm left renal cyst.  These results will be called to the ordering clinician or representative by the Radiologist Assistant, and communication documented in the PACS or zVision Dashboard.   Electronically Signed   By: Marin Olp M.D.   On: 10/01/2014 15:15   Dg Chest 2 View  09/06/2014   CLINICAL DATA:  New cough of 3 days duration. Right upper chest pain. Lung carcinoma, metastatic to brain.  EXAM: CHEST  2 VIEW  COMPARISON:  07/29/2014.  CT 08/11/2014  FINDINGS: Central right lung mass with narrowed interlobar bronchus and with ipsilateral mediastinal adenopathy, probably unchanged from the 08/11/2014 CT. The lungs are clear except for accentuated markings in the bases which likely relate to the shallow degree of inspiration. No effusions are evident.  IMPRESSION: Neoplastic changes in the thorax are probably not significantly different from 08/11/2014. No superimposed acute finding is evident.   Electronically Signed   By: Andreas Newport M.D.   On: 09/06/2014 13:20   Ct Chest Wo Contrast  10/01/2014   CLINICAL DATA:  Restaging extensive stage small cell lung cancer. Lung ca dx 10/15-ongoing chemo, mild CP, small cell quit smoking 10/15 after smoking 30 years. Per Dr. Ilda Foil do scan without contrast- no IV access  EXAM: CT CHEST, ABDOMEN  AND PELVIS WITHOUT CONTRAST  TECHNIQUE: Multidetector CT imaging of the chest, abdomen and pelvis was performed following the standard protocol without IV contrast.  COMPARISON:  08/11/2014  FINDINGS: CT CHEST FINDINGS  Exam done without intravenous contrast due to no IV access.  Lungs are adequately inflated with resolution of the previous noted patchy ground-glass attenuation over the left upper lower lobe. There is mild centrilobular emphysema. Mild linear atelectasis/scarring over the right midlung unchanged. No evidence of pleural effusion. Stable 4 mm subpleural nodule over the posterior left lower lobe. Stable subpleural 1 2 mm nodule over the lateral right upper lobe. No new nodules.  Slight decrease in size of the previously noted necrotic upper right peritracheal node measuring 1.7 x 1.9 cm (previously 2.2 x 2.5 cm). Previously noted right hilar mass continuous with subcarinal adenopathy measures approximately 4.8 x 5.4 cm in total (previously 6.1 x 5 cm). There is continued moderate mass effect on the adjacent right bronchus. No definite left hilar adenopathy and no evidence of axillary adenopathy. Heart remaining mediastinal structures are unchanged.  CT ABDOMEN AND PELVIS FINDINGS  Abdominal images demonstrate the liver, spleen, pancreas, gallbladder  and adrenal glands to be within normal. Kidney is normal in size without hydronephrosis or nephrolithiasis. Stable 2.6 cm left renal cyst. Mild atherosclerotic plaque of the abdominal aorta and iliac arteries. Appendix is normal. There is diverticulosis of the colon. There is no free fluid or adenopathy.  There is inflammatory change within the pericolonic fat adjacent a short segment of the sigmoid colon in the left lower quadrant with prominent associated diverticula as findings suggest acute diverticulitis. No evidence of perforation or abscess. Stable subcutaneous nodule over the left flank measuring 1.4 x 1.8 cm.  Pelvic images demonstrate the uterus,  ovaries, bladder and rectum to be within normal. There are degenerative changes of the spine.  IMPRESSION: Continued positive response to ongoing chemotherapy with decrease in size of mediastinal adenopathy as well as the right hilar/mediastinal mass. Stable subcutaneous nodule over the left flank. Two stable subcentimeter pulmonary nodules as described.  Diverticulosis of the colon with inflammatory change adjacent a short segment of the sigmoid colon in the left lower quadrant concerning for acute diverticulitis. No abscess or perforation.  Stable subcutaneous metastatic nodule over the left flank.  Mild centrilobular emphysema and stable scarring/ atelectasis over the right midlung. Atherosclerotic coronary artery disease.  Stable 2.6 cm left renal cyst.  These results will be called to the ordering clinician or representative by the Radiologist Assistant, and communication documented in the PACS or zVision Dashboard.   Electronically Signed   By: Marin Olp M.D.   On: 10/01/2014 15:15    ASSESSMENT AND PLAN: This is a very pleasant 59 years old African-American female with:  1) Extensive stage small cell lung cancer presented with a right lung mass in the right hilar area with direct mediastinal invasion as well as metastatic soft tissue implant in the subcutaneous fat end of the left flank area and few metastatic brain lesions. She is currently undergoing systemic chemotherapy with carboplatin and etoposide status post 4 cycles. She is tolerating her treatment fairly well with no significant adverse effects. The recent CT scan of the chest, abdomen and pelvis showed further improvement in her disease. Patient was discussed with and also seen by Dr. Julien Nordmann. There is an area on the CT of the abdomen that was concerning for possible diverticulitis. The patient is totally asymptomatic and we have asked her to monitor her symptoms closely. She'll proceed with cycle #5 of her systemic chemotherapy with  carboplatin and etoposide with Neulasta support. She will continue with weekly labs as scheduled. She will follow-up in 3 weeks prior to cycle #6.   2) metastatic brain lesions: We will continue to monitor this closely. She will be scheduled for a repeat MRI of the brain after cycle #4 and may consider the patient for whole brain irradiation at that time if she continues to have progressive disease in the brain. She was evaluated by Dr. Sondra Come.  The patient was advised to call immediately if she has any concerning symptoms in the interval. The patient voices understanding of current disease status and treatment options and is in agreement with the current care plan.  All questions were answered. The patient knows to call the clinic with any problems, questions or concerns. We can certainly see the patient much sooner if necessary.  Carlton Adam PA-C  ADDENDUM: Hematology/Oncology Attending: I had a face to face encounter with the patient. I recommended her care plan. This is a very pleasant 59 years old African-American female with extensive stage small cell lung cancer status post  4 cycles of systemic chemotherapy with carboplatin and etoposide with continuous improvement in her disease. I discussed the scan results with the patient today. I recommended for her to continue her current treatment with carboplatin and etoposide for 2 more cycles. She will receive cycle #5 today and the patient would come back for follow-up visit in 3 weeks with the start of cycle #6. For the metastatic brain lesion, the patient will receive whole brain irradiation after completion of her systemic chemotherapy and will have repeat MRI of the brain at that time. The patient was given a refill of her pain medications. She was advised to call immediately if she has any concerning symptoms in the interval.  Disclaimer: This note was dictated with voice recognition software. Similar sounding words can inadvertently  be transcribed and may not be corrected upon review. Eilleen Kempf., MD 10/09/2014

## 2014-10-05 ENCOUNTER — Ambulatory Visit: Payer: Medicaid Other

## 2014-10-05 NOTE — Patient Instructions (Signed)
[facility] Discharge Instructions for Patients Receiving Chemotherapy  Today you received the following chemotherapy agents Carboplatin and Etoposide.  To help prevent nausea and vomiting after your treatment, we encourage you to take your nausea medication.  If you develop nausea and vomiting that is not controlled by your nausea medication, call the clinic.   BELOW ARE SYMPTOMS THAT SHOULD BE REPORTED IMMEDIATELY:  *FEVER GREATER THAN 100.5 F  *CHILLS WITH OR WITHOUT FEVER  NAUSEA AND VOMITING THAT IS NOT CONTROLLED WITH YOUR NAUSEA MEDICATION  *UNUSUAL SHORTNESS OF BREATH  *UNUSUAL BRUISING OR BLEEDING  TENDERNESS IN MOUTH AND THROAT WITH OR WITHOUT PRESENCE OF ULCERS  *URINARY PROBLEMS  *BOWEL PROBLEMS  UNUSUAL RASH Items with * indicate a potential emergency and should be followed up as soon as possible.  Feel free to call the clinic you have any questions or concerns. The clinic phone number is [phone].

## 2014-10-06 ENCOUNTER — Ambulatory Visit: Payer: Medicaid Other

## 2014-10-07 ENCOUNTER — Ambulatory Visit: Payer: Self-pay

## 2014-10-08 NOTE — Patient Instructions (Signed)
Your CT scan showed further improvement in your disease Call your primary care physician or our office if you develop abdominal pain and/or fever Continue weekly labs as scheduled Follow up in 3 weeks, prior to your next scheduled cycle of chemotherapy

## 2014-10-11 ENCOUNTER — Other Ambulatory Visit: Payer: Self-pay | Admitting: Medical Oncology

## 2014-10-11 ENCOUNTER — Other Ambulatory Visit: Payer: Medicaid Other

## 2014-10-11 ENCOUNTER — Telehealth: Payer: Self-pay | Admitting: Internal Medicine

## 2014-10-11 DIAGNOSIS — C7931 Secondary malignant neoplasm of brain: Secondary | ICD-10-CM

## 2014-10-11 DIAGNOSIS — C3490 Malignant neoplasm of unspecified part of unspecified bronchus or lung: Secondary | ICD-10-CM

## 2014-10-11 MED ORDER — OXYCODONE-ACETAMINOPHEN 5-325 MG PO TABS: 1.0000 | ORAL_TABLET | Freq: Four times a day (QID) | ORAL | Status: DC | PRN

## 2014-10-11 NOTE — Telephone Encounter (Signed)
S/w son Rayshawn confirming labs for 03/01 at 12 per 02/29 POF..... KJ

## 2014-10-12 ENCOUNTER — Other Ambulatory Visit (HOSPITAL_BASED_OUTPATIENT_CLINIC_OR_DEPARTMENT_OTHER): Payer: Medicaid Other

## 2014-10-12 DIAGNOSIS — C7931 Secondary malignant neoplasm of brain: Secondary | ICD-10-CM | POA: Diagnosis not present

## 2014-10-12 DIAGNOSIS — C3491 Malignant neoplasm of unspecified part of right bronchus or lung: Secondary | ICD-10-CM | POA: Diagnosis present

## 2014-10-12 LAB — COMPREHENSIVE METABOLIC PANEL (CC13)
ALK PHOS: 104 U/L (ref 40–150)
ALT: 29 U/L (ref 0–55)
AST: 22 U/L (ref 5–34)
Albumin: 2.8 g/dL — ABNORMAL LOW (ref 3.5–5.0)
Anion Gap: 14 mEq/L — ABNORMAL HIGH (ref 3–11)
BILIRUBIN TOTAL: 0.33 mg/dL (ref 0.20–1.20)
BUN: 9.7 mg/dL (ref 7.0–26.0)
CO2: 25 mEq/L (ref 22–29)
Calcium: 9.4 mg/dL (ref 8.4–10.4)
Chloride: 105 mEq/L (ref 98–109)
Creatinine: 0.6 mg/dL (ref 0.6–1.1)
EGFR: >90 mL/min/{1.73_m2} (ref >90)
GLUCOSE: 78 mg/dL (ref 70–140)
Potassium: 3.1 mEq/L — ABNORMAL LOW (ref 3.5–5.1)
SODIUM: 144 meq/L (ref 136–145)
TOTAL PROTEIN: 6.2 g/dL — AB (ref 6.4–8.3)

## 2014-10-12 LAB — CBC WITH DIFFERENTIAL/PLATELET
BASO%: 0.5 % (ref 0.0–2.0)
BASOS ABS: 0 10*3/uL (ref 0.0–0.1)
EOS%: 0 % (ref 0.0–7.0)
Eosinophils Absolute: 0 10*3/uL (ref 0.0–0.5)
HCT: 30.6 % — ABNORMAL LOW (ref 34.8–46.6)
HGB: 9.8 g/dL — ABNORMAL LOW (ref 11.6–15.9)
LYMPH%: 28.1 % (ref 14.0–49.7)
MCH: 32.2 pg (ref 25.1–34.0)
MCHC: 32.1 g/dL (ref 31.5–36.0)
MCV: 100.3 fL (ref 79.5–101.0)
MONO#: 1.2 10*3/uL — ABNORMAL HIGH (ref 0.1–0.9)
MONO%: 13.4 % (ref 0.0–14.0)
NEUT#: 5 10*3/uL (ref 1.5–6.5)
NEUT%: 58 % (ref 38.4–76.8)
PLATELETS: 245 10*3/uL (ref 145–400)
RBC: 3.05 10*6/uL — AB (ref 3.70–5.45)
RDW: 19.2 % — ABNORMAL HIGH (ref 11.2–14.5)
WBC: 8.6 10*3/uL (ref 3.9–10.3)
lymph#: 2.4 10*3/uL (ref 0.9–3.3)

## 2014-10-12 LAB — TECHNOLOGIST REVIEW

## 2014-10-12 NOTE — Progress Notes (Signed)
Quick Note:  Call patient with the result and order K Dur 20 meq po qd X 7 days. ______

## 2014-10-13 ENCOUNTER — Telehealth: Payer: Self-pay | Admitting: Medical Oncology

## 2014-10-13 DIAGNOSIS — E876 Hypokalemia: Secondary | ICD-10-CM

## 2014-10-13 MED ORDER — POTASSIUM CHLORIDE CRYS ER 20 MEQ PO TBCR: 20.0000 meq | EXTENDED_RELEASE_TABLET | Freq: Every day | ORAL | Status: DC

## 2014-10-13 NOTE — Telephone Encounter (Signed)
-----   Message from Curt Bears, MD sent at 10/12/2014  7:04 PM EST ----- Call patient with the result and order K Dur 20 meq po qd X 7 days.

## 2014-10-13 NOTE — Telephone Encounter (Signed)
Pt notified to pick up rx for kdur.[facility]

## 2014-10-18 ENCOUNTER — Other Ambulatory Visit: Payer: Medicaid Other

## 2014-10-25 ENCOUNTER — Ambulatory Visit (HOSPITAL_BASED_OUTPATIENT_CLINIC_OR_DEPARTMENT_OTHER): Payer: Medicaid Other

## 2014-10-25 ENCOUNTER — Other Ambulatory Visit (HOSPITAL_BASED_OUTPATIENT_CLINIC_OR_DEPARTMENT_OTHER): Payer: Medicaid Other

## 2014-10-25 ENCOUNTER — Ambulatory Visit (HOSPITAL_BASED_OUTPATIENT_CLINIC_OR_DEPARTMENT_OTHER): Payer: Medicaid Other | Admitting: Oncology

## 2014-10-25 ENCOUNTER — Encounter: Payer: Self-pay | Admitting: Oncology

## 2014-10-25 VITALS — BP 113/73 | HR 98 | Temp 98.6°F | Resp 18 | Ht 64.0 in | Wt 153.7 lb

## 2014-10-25 DIAGNOSIS — C3491 Malignant neoplasm of unspecified part of right bronchus or lung: Secondary | ICD-10-CM

## 2014-10-25 DIAGNOSIS — C7931 Secondary malignant neoplasm of brain: Secondary | ICD-10-CM

## 2014-10-25 DIAGNOSIS — C3400 Malignant neoplasm of unspecified main bronchus: Secondary | ICD-10-CM

## 2014-10-25 DIAGNOSIS — C3490 Malignant neoplasm of unspecified part of unspecified bronchus or lung: Secondary | ICD-10-CM

## 2014-10-25 DIAGNOSIS — R918 Other nonspecific abnormal finding of lung field: Secondary | ICD-10-CM

## 2014-10-25 DIAGNOSIS — Z5111 Encounter for antineoplastic chemotherapy: Secondary | ICD-10-CM | POA: Diagnosis not present

## 2014-10-25 LAB — CBC WITH DIFFERENTIAL/PLATELET
BASO%: 1 % (ref 0.0–2.0)
BASOS ABS: 0.2 10*3/uL — AB (ref 0.0–0.1)
EOS ABS: 0 10*3/uL (ref 0.0–0.5)
EOS%: 0.1 % (ref 0.0–7.0)
HCT: 35.8 % (ref 34.8–46.6)
HEMOGLOBIN: 11.2 g/dL — AB (ref 11.6–15.9)
LYMPH%: 17.4 % (ref 14.0–49.7)
MCH: 31.4 pg (ref 25.1–34.0)
MCHC: 31.2 g/dL — ABNORMAL LOW (ref 31.5–36.0)
MCV: 100.6 fL (ref 79.5–101.0)
MONO#: 2 10*3/uL — ABNORMAL HIGH (ref 0.1–0.9)
MONO%: 11.7 % (ref 0.0–14.0)
NEUT#: 12 10*3/uL — ABNORMAL HIGH (ref 1.5–6.5)
NEUT%: 69.8 % (ref 38.4–76.8)
PLATELETS: 254 10*3/uL (ref 145–400)
RBC: 3.56 10*6/uL — ABNORMAL LOW (ref 3.70–5.45)
RDW: 18.8 % — ABNORMAL HIGH (ref 11.2–14.5)
WBC: 17.2 10*3/uL — AB (ref 3.9–10.3)
lymph#: 3 10*3/uL (ref 0.9–3.3)

## 2014-10-25 LAB — COMPREHENSIVE METABOLIC PANEL (CC13)
ALK PHOS: 102 U/L (ref 40–150)
ALT: 46 U/L (ref 0–55)
AST: 30 U/L (ref 5–34)
Albumin: 3.2 g/dL — ABNORMAL LOW (ref 3.5–5.0)
Anion Gap: 11 mEq/L (ref 3–11)
BUN: 6.8 mg/dL — ABNORMAL LOW (ref 7.0–26.0)
CALCIUM: 9.9 mg/dL (ref 8.4–10.4)
CO2: 23 meq/L (ref 22–29)
CREATININE: 0.7 mg/dL (ref 0.6–1.1)
Chloride: 104 mEq/L (ref 98–109)
EGFR: >90 mL/min/{1.73_m2} (ref >90)
Glucose: 104 mg/dl (ref 70–140)
Potassium: 4.2 mEq/L (ref 3.5–5.1)
SODIUM: 138 meq/L (ref 136–145)
TOTAL PROTEIN: 7.2 g/dL (ref 6.4–8.3)
Total Bilirubin: 0.26 mg/dL (ref 0.20–1.20)

## 2014-10-25 MED ORDER — CARBOPLATIN CHEMO INTRADERMAL TEST DOSE 100MCG/0.02ML: 100.0000 ug | Freq: Once | INTRADERMAL | Status: DC

## 2014-10-25 MED ORDER — OXYCODONE-ACETAMINOPHEN 5-325 MG PO TABS: 1.0000 | ORAL_TABLET | Freq: Four times a day (QID) | ORAL | Status: DC | PRN

## 2014-10-25 MED ORDER — SODIUM CHLORIDE 0.9 % IV SOLN
550.0000 mg | Freq: Once | INTRAVENOUS | Status: AC
  Administered 2014-10-25: 550 mg via INTRAVENOUS
  Filled 2014-10-25: qty 55

## 2014-10-25 MED ORDER — ACETAMINOPHEN 325 MG PO TABS
ORAL_TABLET | ORAL | Status: AC
  Filled 2014-10-25: qty 2

## 2014-10-25 MED ORDER — SODIUM CHLORIDE 0.9 % IV SOLN
120.0000 mg/m2 | Freq: Once | INTRAVENOUS | Status: AC
  Administered 2014-10-25: 210 mg via INTRAVENOUS
  Filled 2014-10-25: qty 10.5

## 2014-10-25 MED ORDER — DEXAMETHASONE SODIUM PHOSPHATE 100 MG/10ML IJ SOLN
Freq: Once | INTRAMUSCULAR | Status: AC
  Administered 2014-10-25: 15:00:00 via INTRAVENOUS
  Filled 2014-10-25: qty 8

## 2014-10-25 MED ORDER — SODIUM CHLORIDE 0.9 % IV SOLN
Freq: Once | INTRAVENOUS | Status: AC
  Administered 2014-10-25: 14:00:00 via INTRAVENOUS

## 2014-10-25 MED ORDER — CALCIUM CARBONATE-VITAMIN D 500-200 MG-UNIT PO TABS: 1.0000 | ORAL_TABLET | Freq: Two times a day (BID) | ORAL | Status: AC

## 2014-10-25 MED ORDER — DEXTROMETHORPHAN POLISTIREX 30 MG/5ML PO LQCR: 30.0000 mg | Freq: Two times a day (BID) | ORAL | Status: DC | PRN

## 2014-10-25 MED ORDER — PROCHLORPERAZINE MALEATE 10 MG PO TABS: 10.0000 mg | ORAL_TABLET | Freq: Four times a day (QID) | ORAL | Status: DC | PRN

## 2014-10-25 MED ORDER — ACETAMINOPHEN 325 MG PO TABS
650.0000 mg | ORAL_TABLET | Freq: Once | ORAL | Status: AC
  Administered 2014-10-25: 650 mg via ORAL

## 2014-10-25 NOTE — Patient Instructions (Signed)
[facility] Discharge Instructions for Patients Receiving Chemotherapy  Today you received the following chemotherapy agents: Carboplatin and Etoposide.   To help prevent nausea and vomiting after your treatment, we encourage you to take your nausea medication as directed.    If you develop nausea and vomiting that is not controlled by your nausea medication, call the clinic.   BELOW ARE SYMPTOMS THAT SHOULD BE REPORTED IMMEDIATELY:  *FEVER GREATER THAN 100.5 F  *CHILLS WITH OR WITHOUT FEVER  NAUSEA AND VOMITING THAT IS NOT CONTROLLED WITH YOUR NAUSEA MEDICATION  *UNUSUAL SHORTNESS OF BREATH  *UNUSUAL BRUISING OR BLEEDING  TENDERNESS IN MOUTH AND THROAT WITH OR WITHOUT PRESENCE OF ULCERS  *URINARY PROBLEMS  *BOWEL PROBLEMS  UNUSUAL RASH Items with * indicate a potential emergency and should be followed up as soon as possible.  Feel free to call the clinic you have any questions or concerns. The clinic phone number is [phone].

## 2014-10-25 NOTE — Progress Notes (Signed)
[place] [phone]   [phone]  OFFICE PROGRESS NOTE  No PCP Per Patient No address on file  DIAGNOSIS: Extensive stage small cell lung cancer diagnosed in November 2015 presented with large right hilar mass with direct mediastinal invasion in addition to mediastinal lymphadenopathy and metastatic lesion in the subcutaneous fat of the left flank.  PRIOR THERAPY: None  CURRENT THERAPY: Systemic chemotherapy with carboplatin for AUC of 5 on day 1 and etoposide 120 MG/M2 on days 1, 2 and 3 with Neulasta support on day 4. First cycle on 07/05/2014. Status post 5 cycles.  INTERVAL HISTORY: Mariah Park 59 y.o. female returns to the clinic today for follow-up visit.  She is scheduled for her 6th cycle of chemotherapy today. She denied any fever or chills.  The patient is tolerating her systemic chemotherapy with carboplatin and etoposide fairly well with no significant adverse effects. She denied having any significant nausea or vomiting.  She denied having any significant chest pain and continues to have improvement in her shortness of breath with no hemoptysis. The patient denied having any significant weight loss or night sweats. She denied any GI symptomatology, denied fever or chills.   MEDICAL HISTORY: Past Medical History  Diagnosis Date  . Brain metastases 06/21/2014    ALLERGIES:  has No Known Allergies.  MEDICATIONS:  Current Outpatient Prescriptions  Medication Sig Dispense Refill  . aspirin 81 MG tablet Take 81 mg by mouth daily as needed for pain (chest pain).    . calcium-vitamin D (OSCAL WITH D) 500-200 MG-UNIT per tablet Take 1 tablet by mouth 2 (two) times daily. 60 tablet 0  . dexamethasone (DECADRON) 4 MG tablet Take 1 tablet (4 mg total) by mouth as directed. 2 tabs BID the day before, day of, and  day after chemo 60 tablet 0  . dextromethorphan (DELSYM) 30 MG/5ML liquid Take 5 mLs (30 mg total) by mouth every 12 (twelve)  hours as needed for cough. 500 mL 1  . ipratropium-albuterol (DUONEB) 0.5-2.5 (3) MG/3ML SOLN Take 3 mLs by nebulization every 6 (six) hours as needed. 360 mL 1  . Multiple Vitamins-Minerals (MULTIVITAMIN WITH MINERALS) tablet Take 1 tablet by mouth daily.    . ondansetron (ZOFRAN) 8 MG tablet Take 1 tablet (8 mg total) by mouth every 8 (eight) hours as needed for nausea or vomiting. 30 tablet 0  . oxyCODONE-acetaminophen (PERCOCET) 5-325 MG per tablet Take 1 tablet by mouth every 6 (six) hours as needed. 30 tablet 0  . potassium chloride SA (K-DUR,KLOR-CON) 20 MEQ tablet Take 1 tablet (20 mEq total) by mouth daily. 7 tablet 0  . prochlorperazine (COMPAZINE) 10 MG tablet Take 1 tablet (10 mg total) by mouth every 6 (six) hours as needed for nausea or vomiting. 30 tablet 0   No current facility-administered medications for this visit.    SURGICAL HISTORY: History reviewed. No pertinent past surgical history.  REVIEW OF SYSTEMS:  Constitutional: negative Eyes: negative Ears, nose, mouth, throat, and face: negative Respiratory: positive for cough and dyspnea on exertion Cardiovascular: negative Gastrointestinal: negative Genitourinary:negative Integument/breast: negative Hematologic/lymphatic: negative Musculoskeletal:negative Neurological: negative Behavioral/Psych: negative Endocrine: negative Allergic/Immunologic: negative   PHYSICAL EXAMINATION: General appearance: alert, cooperative and no distress Head: Normocephalic, without obvious abnormality, atraumatic Neck: no adenopathy, no JVD, supple, symmetrical, trachea midline and thyroid not enlarged, symmetric, no tenderness/mass/nodules Lymph nodes: Cervical, supraclavicular, and axillary nodes normal. Resp: clear to auscultation bilaterally Back: symmetric, no curvature. ROM normal. No  CVA tenderness. Cardio: regular rate and rhythm, S1, S2 normal, no murmur, click, rub or gallop GI: soft, non-tender; bowel sounds normal; no  masses,  no organomegaly Extremities: extremities normal, atraumatic, no cyanosis or edema Neurologic: Alert and oriented X 3, normal strength and tone. Normal symmetric reflexes. Normal coordination and gait  ECOG PERFORMANCE STATUS: 1 - Symptomatic but completely ambulatory  Blood pressure 113/73, pulse 98, temperature 98.6 F (37 C), temperature source Oral, resp. rate 18, height 5\' 4"  (1.626 m), weight 153 lb 11.2 oz (69.718 kg).  LABORATORY DATA: Lab Results  Component Value Date   WBC 17.2* 10/25/2014   HGB 11.2* 10/25/2014   HCT 35.8 10/25/2014   MCV 100.6 10/25/2014   PLT 254 10/25/2014      Chemistry      Component Value Date/Time   NA 138 10/25/2014 1314   NA 136* 07/31/2014 0542   K 4.2 10/25/2014 1314   K 4.5 07/31/2014 0542   CL 94* 07/31/2014 0542   CO2 23 10/25/2014 1314   CO2 25 07/31/2014 0542   BUN 6.8* 10/25/2014 1314   BUN 10 07/31/2014 0542   CREATININE 0.7 10/25/2014 1314   CREATININE 0.41* 07/31/2014 0542      Component Value Date/Time   CALCIUM 9.9 10/25/2014 1314   CALCIUM 9.8 07/31/2014 0542   ALKPHOS 102 10/25/2014 1314   AST 30 10/25/2014 1314   ALT 46 10/25/2014 1314   BILITOT 0.26 10/25/2014 1314       RADIOGRAPHIC STUDIES: Ct Abdomen Pelvis Wo Contrast  10/01/2014   CLINICAL DATA:  Restaging extensive stage small cell lung cancer. Lung ca dx 10/15-ongoing chemo, mild CP, small cell quit smoking 10/15 after smoking 30 years. Per Dr. Ilda Foil do scan without contrast- no IV access  EXAM: CT CHEST, ABDOMEN AND PELVIS WITHOUT CONTRAST  TECHNIQUE: Multidetector CT imaging of the chest, abdomen and pelvis was performed following the standard protocol without IV contrast.  COMPARISON:  08/11/2014  FINDINGS: CT CHEST FINDINGS  Exam done without intravenous contrast due to no IV access.  Lungs are adequately inflated with resolution of the previous noted patchy ground-glass attenuation over the left upper lower lobe. There is mild centrilobular  emphysema. Mild linear atelectasis/scarring over the right midlung unchanged. No evidence of pleural effusion. Stable 4 mm subpleural nodule over the posterior left lower lobe. Stable subpleural 1 2 mm nodule over the lateral right upper lobe. No new nodules.  Slight decrease in size of the previously noted necrotic upper right peritracheal node measuring 1.7 x 1.9 cm (previously 2.2 x 2.5 cm). Previously noted right hilar mass continuous with subcarinal adenopathy measures approximately 4.8 x 5.4 cm in total (previously 6.1 x 5 cm). There is continued moderate mass effect on the adjacent right bronchus. No definite left hilar adenopathy and no evidence of axillary adenopathy. Heart remaining mediastinal structures are unchanged.  CT ABDOMEN AND PELVIS FINDINGS  Abdominal images demonstrate the liver, spleen, pancreas, gallbladder and adrenal glands to be within normal. Kidney is normal in size without hydronephrosis or nephrolithiasis. Stable 2.6 cm left renal cyst. Mild atherosclerotic plaque of the abdominal aorta and iliac arteries. Appendix is normal. There is diverticulosis of the colon. There is no free fluid or adenopathy.  There is inflammatory change within the pericolonic fat adjacent a short segment of the sigmoid colon in the left lower quadrant with prominent associated diverticula as findings suggest acute diverticulitis. No evidence of perforation or abscess. Stable subcutaneous nodule over the left flank measuring 1.4  x 1.8 cm.  Pelvic images demonstrate the uterus, ovaries, bladder and rectum to be within normal. There are degenerative changes of the spine.  IMPRESSION: Continued positive response to ongoing chemotherapy with decrease in size of mediastinal adenopathy as well as the right hilar/mediastinal mass. Stable subcutaneous nodule over the left flank. Two stable subcentimeter pulmonary nodules as described.  Diverticulosis of the colon with inflammatory change adjacent a short segment of  the sigmoid colon in the left lower quadrant concerning for acute diverticulitis. No abscess or perforation.  Stable subcutaneous metastatic nodule over the left flank.  Mild centrilobular emphysema and stable scarring/ atelectasis over the right midlung. Atherosclerotic coronary artery disease.  Stable 2.6 cm left renal cyst.  These results will be called to the ordering clinician or representative by the Radiologist Assistant, and communication documented in the PACS or zVision Dashboard.   Electronically Signed   By: Marin Olp M.D.   On: 10/01/2014 15:15   Ct Chest Wo Contrast  10/01/2014   CLINICAL DATA:  Restaging extensive stage small cell lung cancer. Lung ca dx 10/15-ongoing chemo, mild CP, small cell quit smoking 10/15 after smoking 30 years. Per Dr. Ilda Foil do scan without contrast- no IV access  EXAM: CT CHEST, ABDOMEN AND PELVIS WITHOUT CONTRAST  TECHNIQUE: Multidetector CT imaging of the chest, abdomen and pelvis was performed following the standard protocol without IV contrast.  COMPARISON:  08/11/2014  FINDINGS: CT CHEST FINDINGS  Exam done without intravenous contrast due to no IV access.  Lungs are adequately inflated with resolution of the previous noted patchy ground-glass attenuation over the left upper lower lobe. There is mild centrilobular emphysema. Mild linear atelectasis/scarring over the right midlung unchanged. No evidence of pleural effusion. Stable 4 mm subpleural nodule over the posterior left lower lobe. Stable subpleural 1 2 mm nodule over the lateral right upper lobe. No new nodules.  Slight decrease in size of the previously noted necrotic upper right peritracheal node measuring 1.7 x 1.9 cm (previously 2.2 x 2.5 cm). Previously noted right hilar mass continuous with subcarinal adenopathy measures approximately 4.8 x 5.4 cm in total (previously 6.1 x 5 cm). There is continued moderate mass effect on the adjacent right bronchus. No definite left hilar adenopathy and no  evidence of axillary adenopathy. Heart remaining mediastinal structures are unchanged.  CT ABDOMEN AND PELVIS FINDINGS  Abdominal images demonstrate the liver, spleen, pancreas, gallbladder and adrenal glands to be within normal. Kidney is normal in size without hydronephrosis or nephrolithiasis. Stable 2.6 cm left renal cyst. Mild atherosclerotic plaque of the abdominal aorta and iliac arteries. Appendix is normal. There is diverticulosis of the colon. There is no free fluid or adenopathy.  There is inflammatory change within the pericolonic fat adjacent a short segment of the sigmoid colon in the left lower quadrant with prominent associated diverticula as findings suggest acute diverticulitis. No evidence of perforation or abscess. Stable subcutaneous nodule over the left flank measuring 1.4 x 1.8 cm.  Pelvic images demonstrate the uterus, ovaries, bladder and rectum to be within normal. There are degenerative changes of the spine.  IMPRESSION: Continued positive response to ongoing chemotherapy with decrease in size of mediastinal adenopathy as well as the right hilar/mediastinal mass. Stable subcutaneous nodule over the left flank. Two stable subcentimeter pulmonary nodules as described.  Diverticulosis of the colon with inflammatory change adjacent a short segment of the sigmoid colon in the left lower quadrant concerning for acute diverticulitis. No abscess or perforation.  Stable subcutaneous metastatic nodule  over the left flank.  Mild centrilobular emphysema and stable scarring/ atelectasis over the right midlung. Atherosclerotic coronary artery disease.  Stable 2.6 cm left renal cyst.  These results will be called to the ordering clinician or representative by the Radiologist Assistant, and communication documented in the PACS or zVision Dashboard.   Electronically Signed   By: Marin Olp M.D.   On: 10/01/2014 15:15    ASSESSMENT AND PLAN: This is a very pleasant 59 year old African-American female  with:  1) Extensive stage small cell lung cancer presented with a right lung mass in the right hilar area with direct mediastinal invasion as well as metastatic soft tissue implant in the subcutaneous fat end of the left flank area and few metastatic brain lesions. She is currently undergoing systemic chemotherapy with carboplatin and etoposide status post 5 cycles. She is tolerating her treatment fairly well with no significant adverse effects. The recent CT scan of the chest, abdomen and pelvis showed further improvement in her disease. Patient was discussed with and also seen by Dr. Julien Nordmann. She will proceed with cycle #6 of her systemic chemotherapy with carboplatin and etoposide with Neulasta support. She will continue with weekly labs as scheduled. She will have restaging CT scans of the chest abdomen and pelvis as well as an MRI of the brain for restaging in approximately 2-3 weeks. She will follow-up with [place] thereafter to discuss these results. We will consider referring her for radiation to the brain pending the results of the MRI.  2) metastatic brain lesions: We will continue to monitor this closely. An MRI of the brain was scheduled for the patient and pending these results we will consider a referral for whole brain irradiation at that time if she continues to have progressive disease in the brain. She was evaluated by Dr. Sondra Come.  The patient was advised to call immediately if she has any concerning symptoms in the interval. The patient voices understanding of current disease status and treatment options and is in agreement with the current care plan.  All questions were answered. The patient knows to call the clinic with any problems, questions or concerns. We can certainly see the patient much sooner if necessary.  Mikey Bussing, DNP, AGPCNP-BC, AOCNP  ADDENDUM: Hematology/Oncology Attending: I had a face to face encounter with the patient today. I recommended her care plan. This is  a very pleasant 59 years old African-American female with extensive stage small cell lung cancer currently undergoing systemic chemotherapy was carboplatin and etoposide status post 5 cycles. The patient is tolerating her treatment well with no significant adverse effects. I recommended for her to proceed with cycle #6 today as a scheduled. She will come back for follow-up visit in 3 weeks after repeating CT scan of the chest, abdomen and pelvis as well as brain MRI for evaluation of the metastatic brain lesions. She was advised to call immediately if she has any concerning symptoms in the interval.  Disclaimer: This note was dictated with voice recognition software. Similar sounding words can inadvertently be transcribed and may be missed upon review. Eilleen Kempf., MD 10/25/2014

## 2014-10-26 ENCOUNTER — Other Ambulatory Visit: Payer: Self-pay | Admitting: *Deleted

## 2014-10-26 ENCOUNTER — Ambulatory Visit: Payer: Medicaid Other

## 2014-10-26 ENCOUNTER — Other Ambulatory Visit: Payer: Self-pay | Admitting: Oncology

## 2014-10-26 ENCOUNTER — Telehealth: Payer: Self-pay | Admitting: Oncology

## 2014-10-26 NOTE — Telephone Encounter (Signed)
per pof to sch pt appt-cld & left pt a message to adv of next appt-adv pt to get updated sch 10/26/14-pt understood

## 2014-10-27 ENCOUNTER — Telehealth: Payer: Self-pay | Admitting: Medical Oncology

## 2014-10-27 ENCOUNTER — Other Ambulatory Visit: Payer: Self-pay | Admitting: Medical Oncology

## 2014-10-27 ENCOUNTER — Ambulatory Visit: Payer: Medicaid Other

## 2014-10-27 DIAGNOSIS — C7931 Secondary malignant neoplasm of brain: Secondary | ICD-10-CM

## 2014-10-27 NOTE — Telephone Encounter (Signed)
I left message for son to call me back to tell me where to call in decadron refill.

## 2014-10-28 ENCOUNTER — Ambulatory Visit: Payer: Medicaid Other

## 2014-11-01 ENCOUNTER — Telehealth: Payer: Self-pay | Admitting: Medical Oncology

## 2014-11-01 ENCOUNTER — Telehealth: Payer: Self-pay | Admitting: *Deleted

## 2014-11-01 ENCOUNTER — Other Ambulatory Visit (HOSPITAL_BASED_OUTPATIENT_CLINIC_OR_DEPARTMENT_OTHER): Payer: Medicaid Other

## 2014-11-01 DIAGNOSIS — C3491 Malignant neoplasm of unspecified part of right bronchus or lung: Secondary | ICD-10-CM

## 2014-11-01 DIAGNOSIS — C7931 Secondary malignant neoplasm of brain: Secondary | ICD-10-CM

## 2014-11-01 DIAGNOSIS — C3490 Malignant neoplasm of unspecified part of unspecified bronchus or lung: Secondary | ICD-10-CM

## 2014-11-01 DIAGNOSIS — C3400 Malignant neoplasm of unspecified main bronchus: Secondary | ICD-10-CM

## 2014-11-01 DIAGNOSIS — R918 Other nonspecific abnormal finding of lung field: Secondary | ICD-10-CM

## 2014-11-01 LAB — COMPREHENSIVE METABOLIC PANEL (CC13)
ALBUMIN: 3.2 g/dL — AB (ref 3.5–5.0)
ALK PHOS: 91 U/L (ref 40–150)
ALT: 72 U/L — ABNORMAL HIGH (ref 0–55)
AST: 39 U/L — AB (ref 5–34)
Anion Gap: 12 mEq/L — ABNORMAL HIGH (ref 3–11)
BUN: 12.4 mg/dL (ref 7.0–26.0)
CHLORIDE: 101 meq/L (ref 98–109)
CO2: 31 mEq/L — ABNORMAL HIGH (ref 22–29)
CREATININE: 0.7 mg/dL (ref 0.6–1.1)
Calcium: 9.7 mg/dL (ref 8.4–10.4)
EGFR: >90 mL/min/{1.73_m2} (ref >90)
Glucose: 98 mg/dl (ref 70–140)
Potassium: 3.3 mEq/L — ABNORMAL LOW (ref 3.5–5.1)
Sodium: 144 mEq/L (ref 136–145)
Total Bilirubin: 0.21 mg/dL (ref 0.20–1.20)
Total Protein: 6.3 g/dL — ABNORMAL LOW (ref 6.4–8.3)

## 2014-11-01 LAB — CBC WITH DIFFERENTIAL/PLATELET
BASO%: 0.1 % (ref 0.0–2.0)
Basophils Absolute: 0 10*3/uL (ref 0.0–0.1)
EOS%: 0 % (ref 0.0–7.0)
Eosinophils Absolute: 0 10*3/uL (ref 0.0–0.5)
HCT: 32.2 % — ABNORMAL LOW (ref 34.8–46.6)
HGB: 10.3 g/dL — ABNORMAL LOW (ref 11.6–15.9)
LYMPH%: 33 % (ref 14.0–49.7)
MCH: 31.9 pg (ref 25.1–34.0)
MCHC: 32 g/dL (ref 31.5–36.0)
MCV: 99.7 fL (ref 79.5–101.0)
MONO#: 0.1 10*3/uL (ref 0.1–0.9)
MONO%: 1.1 % (ref 0.0–14.0)
NEUT#: 6.5 10*3/uL (ref 1.5–6.5)
NEUT%: 65.8 % (ref 38.4–76.8)
Platelets: 193 10*3/uL (ref 145–400)
RBC: 3.23 10*6/uL — AB (ref 3.70–5.45)
RDW: 16.8 % — AB (ref 11.2–14.5)
WBC: 9.9 10*3/uL (ref 3.9–10.3)
lymph#: 3.3 10*3/uL (ref 0.9–3.3)

## 2014-11-01 MED ORDER — OXYCODONE-ACETAMINOPHEN 5-325 MG PO TABS: 1.0000 | ORAL_TABLET | Freq: Four times a day (QID) | ORAL | Status: DC | PRN

## 2014-11-01 MED ORDER — DEXAMETHASONE 4 MG PO TABS: 4.0000 mg | ORAL_TABLET | Freq: Two times a day (BID) | ORAL | Status: AC

## 2014-11-01 NOTE — Telephone Encounter (Signed)
Rx for percocet given to [place].

## 2014-11-01 NOTE — Telephone Encounter (Signed)
SOME OF PT.'S MEDICATIONS HAVE BEEN FILLED WITHIN THE PAST WEEK. NOTIFIED DR.MOHAMED'S NURSE, DIANE BELL,RN.

## 2014-11-01 NOTE — Telephone Encounter (Signed)
Son said to call in decadron for brain mets to [facility]

## 2014-11-10 ENCOUNTER — Telehealth (HOSPITAL_COMMUNITY): Payer: Self-pay | Admitting: Radiology

## 2014-11-10 NOTE — Telephone Encounter (Signed)
Spoke with pt on 3-30, told her CT abd/pelvis was denied by her insurance and to not drink the cm.  Told she still needs to be npo 4hrs prior for other scans.

## 2014-11-11 ENCOUNTER — Telehealth: Payer: Self-pay | Admitting: Medical Oncology

## 2014-11-11 ENCOUNTER — Ambulatory Visit (HOSPITAL_COMMUNITY): Payer: Medicaid Other

## 2014-11-11 ENCOUNTER — Ambulatory Visit (HOSPITAL_COMMUNITY): Admission: RE | Admit: 2014-11-11 | Payer: Medicaid Other | Source: Ambulatory Visit

## 2014-11-11 ENCOUNTER — Telehealth: Payer: Self-pay | Admitting: Internal Medicine

## 2014-11-11 ENCOUNTER — Other Ambulatory Visit: Payer: Medicaid Other

## 2014-11-11 NOTE — Telephone Encounter (Signed)
Pt could not make scan appt due to transportation needs advance notice and i transferred pt to radiology to r/s

## 2014-11-11 NOTE — Telephone Encounter (Signed)
s.w. pt and advised on 4.4 lab cx and moved to 4.7 with visit....pt ok and aware

## 2014-11-12 ENCOUNTER — Telehealth: Payer: Self-pay

## 2014-11-12 ENCOUNTER — Other Ambulatory Visit: Payer: Self-pay | Admitting: Internal Medicine

## 2014-11-12 DIAGNOSIS — C3491 Malignant neoplasm of unspecified part of right bronchus or lung: Secondary | ICD-10-CM

## 2014-11-12 MED ORDER — ONDANSETRON HCL 8 MG PO TABS: 8.0000 mg | ORAL_TABLET | Freq: Three times a day (TID) | ORAL | Status: DC | PRN

## 2014-11-12 NOTE — Telephone Encounter (Signed)
Called pt that we have an electronic refill request for zofran. She will need the refill.

## 2014-11-15 ENCOUNTER — Other Ambulatory Visit: Payer: Medicaid Other

## 2014-11-18 ENCOUNTER — Encounter: Payer: Self-pay | Admitting: Physician Assistant

## 2014-11-18 ENCOUNTER — Telehealth: Payer: Self-pay | Admitting: Internal Medicine

## 2014-11-18 ENCOUNTER — Other Ambulatory Visit (HOSPITAL_BASED_OUTPATIENT_CLINIC_OR_DEPARTMENT_OTHER): Payer: Medicaid Other

## 2014-11-18 ENCOUNTER — Ambulatory Visit (HOSPITAL_BASED_OUTPATIENT_CLINIC_OR_DEPARTMENT_OTHER): Payer: Medicaid Other | Admitting: Physician Assistant

## 2014-11-18 VITALS — BP 139/77 | HR 111 | Temp 97.6°F | Resp 18 | Ht 64.0 in | Wt 163.7 lb

## 2014-11-18 DIAGNOSIS — C3490 Malignant neoplasm of unspecified part of unspecified bronchus or lung: Secondary | ICD-10-CM

## 2014-11-18 DIAGNOSIS — C3491 Malignant neoplasm of unspecified part of right bronchus or lung: Secondary | ICD-10-CM | POA: Diagnosis present

## 2014-11-18 DIAGNOSIS — C7931 Secondary malignant neoplasm of brain: Secondary | ICD-10-CM | POA: Diagnosis not present

## 2014-11-18 DIAGNOSIS — C3400 Malignant neoplasm of unspecified main bronchus: Secondary | ICD-10-CM

## 2014-11-18 DIAGNOSIS — C7989 Secondary malignant neoplasm of other specified sites: Secondary | ICD-10-CM | POA: Diagnosis not present

## 2014-11-18 DIAGNOSIS — R918 Other nonspecific abnormal finding of lung field: Secondary | ICD-10-CM

## 2014-11-18 LAB — CBC WITH DIFFERENTIAL/PLATELET
BASO%: 1.4 % (ref 0.0–2.0)
Basophils Absolute: 0.1 10*3/uL (ref 0.0–0.1)
EOS%: 0 % (ref 0.0–7.0)
Eosinophils Absolute: 0 10*3/uL (ref 0.0–0.5)
HCT: 32.7 % — ABNORMAL LOW (ref 34.8–46.6)
HGB: 10.5 g/dL — ABNORMAL LOW (ref 11.6–15.9)
LYMPH%: 37.5 % (ref 14.0–49.7)
MCH: 31.9 pg (ref 25.1–34.0)
MCHC: 32.1 g/dL (ref 31.5–36.0)
MCV: 99.4 fL (ref 79.5–101.0)
MONO#: 1.1 10*3/uL — ABNORMAL HIGH (ref 0.1–0.9)
MONO%: 11.3 % (ref 0.0–14.0)
NEUT%: 49.8 % (ref 38.4–76.8)
NEUTROS ABS: 4.7 10*3/uL (ref 1.5–6.5)
Platelets: 147 10*3/uL (ref 145–400)
RBC: 3.29 10*6/uL — AB (ref 3.70–5.45)
RDW: 18.1 % — AB (ref 11.2–14.5)
WBC: 9.4 10*3/uL (ref 3.9–10.3)
lymph#: 3.5 10*3/uL — ABNORMAL HIGH (ref 0.9–3.3)

## 2014-11-18 LAB — COMPREHENSIVE METABOLIC PANEL (CC13)
ALT: 53 U/L (ref 0–55)
ANION GAP: 13 meq/L — AB (ref 3–11)
AST: 26 U/L (ref 5–34)
Albumin: 3 g/dL — ABNORMAL LOW (ref 3.5–5.0)
Alkaline Phosphatase: 106 U/L (ref 40–150)
BUN: 13.4 mg/dL (ref 7.0–26.0)
CO2: 25 meq/L (ref 22–29)
Calcium: 9.3 mg/dL (ref 8.4–10.4)
Chloride: 106 mEq/L (ref 98–109)
Creatinine: 0.6 mg/dL (ref 0.6–1.1)
EGFR: >90 mL/min/{1.73_m2} (ref >90)
Glucose: 110 mg/dl (ref 70–140)
Potassium: 4 mEq/L (ref 3.5–5.1)
SODIUM: 144 meq/L (ref 136–145)
Total Protein: 6.3 g/dL — ABNORMAL LOW (ref 6.4–8.3)

## 2014-11-18 LAB — TECHNOLOGIST REVIEW

## 2014-11-18 MED ORDER — DEXTROMETHORPHAN POLISTIREX 30 MG/5ML PO LQCR: 30.0000 mg | Freq: Two times a day (BID) | ORAL | Status: AC | PRN

## 2014-11-18 MED ORDER — OXYCODONE-ACETAMINOPHEN 7.5-325 MG PO TABS: 1.0000 | ORAL_TABLET | Freq: Four times a day (QID) | ORAL | Status: AC | PRN

## 2014-11-18 MED ORDER — OXYCODONE-ACETAMINOPHEN 7.5-325 MG PO TABS: 1.0000 | ORAL_TABLET | Freq: Four times a day (QID) | ORAL | Status: DC | PRN

## 2014-11-18 NOTE — Telephone Encounter (Signed)
gave and printed appt sched and avs fo rpt for April....gv pt barium

## 2014-11-18 NOTE — Progress Notes (Signed)
[place] [phone]   [phone]) [phone]  OFFICE PROGRESS NOTE  No PCP Per Patient No address on file  DIAGNOSIS: Extensive stage small cell lung cancer diagnosed in November 2015 presented with large right hilar mass with direct mediastinal invasion in addition to mediastinal lymphadenopathy and metastatic lesion in the subcutaneous fat of the left flank.  PRIOR THERAPY: None  CURRENT THERAPY: Systemic chemotherapy with carboplatin for AUC of 5 on day 1 and etoposide 120 MG/M2 on days 1, 2 and 3 with Neulasta support on day 4. First cycle on 07/05/2014. Status post 6 cycles.  INTERVAL HISTORY: Mariah Park 59 y.o. female returns to the clinic today for follow-up visit.  She is status post 6 cycles of systemic chemotherapy with carboplatin and etoposide with Neulasta support. She tolerated her chemotherapy relatively well. She requests refills for her pain medication and Delsym cough syrup that she gets filled at the [facility]. She has not had her restaging CT scans done yet, they are scheduled for 11/23/2014.  She denied having any significant nausea or vomiting.  She denied having any significant chest pain and continues to have improvement in her shortness of breath with no hemoptysis. The patient denied having any significant weight loss or night sweats. She denied any GI symptomatology, denied fever or chills.   MEDICAL HISTORY: Past Medical History  Diagnosis Date  . Brain metastases 06/21/2014    ALLERGIES:  has No Known Allergies.  MEDICATIONS:  Current Outpatient Prescriptions  Medication Sig Dispense Refill  . aspirin 81 MG tablet Take 81 mg by mouth daily as needed for pain (chest pain).    . calcium-vitamin D (OSCAL WITH D) 500-200 MG-UNIT per tablet Take 1 tablet by mouth 2 (two) times daily. 60 tablet 0  . dexamethasone (DECADRON) 4 MG tablet Take 1 tablet (4 mg total) by mouth 2 (two) times daily with a meal.  60 tablet 0  . dextromethorphan (DELSYM) 30 MG/5ML liquid Take 5 mLs (30 mg total) by mouth every 12 (twelve) hours as needed for cough. 500 mL 1  . ipratropium-albuterol (DUONEB) 0.5-2.5 (3) MG/3ML SOLN Take 3 mLs by nebulization every 6 (six) hours as needed. 360 mL 1  . Multiple Vitamins-Minerals (MULTIVITAMIN WITH MINERALS) tablet Take 1 tablet by mouth daily.    . ondansetron (ZOFRAN) 8 MG tablet Take 1 tablet (8 mg total) by mouth every 8 (eight) hours as needed for nausea or vomiting. 30 tablet 0  . potassium chloride SA (K-DUR,KLOR-CON) 20 MEQ tablet TAKE 1 TABLET BY MOUTH EVERY DAY 7 tablet 1  . prochlorperazine (COMPAZINE) 10 MG tablet Take 1 tablet (10 mg total) by mouth every 6 (six) hours as needed for nausea or vomiting. 30 tablet 0  . oxyCODONE-acetaminophen (PERCOCET) 7.5-325 MG per tablet Take 1 tablet by mouth every 6 (six) hours as needed for pain. 30 tablet 0   No current facility-administered medications for this visit.    SURGICAL HISTORY: History reviewed. No pertinent past surgical history.  REVIEW OF SYSTEMS:  Constitutional: negative Eyes: negative Ears, nose, mouth, throat, and face: negative Respiratory: positive for cough and dyspnea on exertion Cardiovascular: negative Gastrointestinal: negative Genitourinary:negative Integument/breast: negative Hematologic/lymphatic: negative Musculoskeletal:negative Neurological: negative Behavioral/Psych: negative Endocrine: negative Allergic/Immunologic: negative   PHYSICAL EXAMINATION: General appearance: alert, cooperative and no distress Head: Normocephalic, without obvious abnormality, atraumatic Neck: no adenopathy, no JVD, supple, symmetrical, trachea midline and thyroid not enlarged, symmetric, no tenderness/mass/nodules Lymph nodes: Cervical,  supraclavicular, and axillary nodes normal. Resp: clear to auscultation bilaterally Back: symmetric, no curvature. ROM normal. No CVA tenderness. Cardio: regular rate  and rhythm, S1, S2 normal, no murmur, click, rub or gallop GI: soft, non-tender; bowel sounds normal; no masses,  no organomegaly Extremities: extremities normal, atraumatic, no cyanosis or edema Neurologic: Alert and oriented X 3, normal strength and tone. Normal symmetric reflexes. Normal coordination and gait  ECOG PERFORMANCE STATUS: 1 - Symptomatic but completely ambulatory  Blood pressure 139/77, pulse 111, temperature 97.6 F (36.4 C), temperature source Oral, resp. rate 18, height 5\' 4"  (1.626 m), weight 163 lb 11.2 oz (74.254 kg), SpO2 93 %.  LABORATORY DATA: Lab Results  Component Value Date   WBC 9.4 11/18/2014   HGB 10.5* 11/18/2014   HCT 32.7* 11/18/2014   MCV 99.4 11/18/2014   PLT 147 11/18/2014      Chemistry      Component Value Date/Time   NA 144 11/18/2014 1000   NA 136* 07/31/2014 0542   K 4.0 11/18/2014 1000   K 4.5 07/31/2014 0542   CL 94* 07/31/2014 0542   CO2 25 11/18/2014 1000   CO2 25 07/31/2014 0542   BUN 13.4 11/18/2014 1000   BUN 10 07/31/2014 0542   CREATININE 0.6 11/18/2014 1000   CREATININE 0.41* 07/31/2014 0542      Component Value Date/Time   CALCIUM 9.3 11/18/2014 1000   CALCIUM 9.8 07/31/2014 0542   ALKPHOS 106 11/18/2014 1000   AST 26 11/18/2014 1000   ALT 53 11/18/2014 1000   BILITOT <0.20 11/18/2014 1000       RADIOGRAPHIC STUDIES: No results found.  ASSESSMENT AND PLAN: This is a very pleasant 59 year old African-American female with:  1) Extensive stage small cell lung cancer presented with a right lung mass in the right hilar area with direct mediastinal invasion as well as metastatic soft tissue implant in the subcutaneous fat end of the left flank area and few metastatic brain lesions. She is currently undergoing systemic chemotherapy with carboplatin and etoposide status post 6 cycles. She tolerated her treatment fairly well with no significant adverse effects. The last CT scan of the chest, abdomen and pelvis showed  further improvement in her disease. She will return in one week to discuss the results of her restaging CT scans and MRI of the brain. We will consider referring her for radiation to the brain pending the results of the MRI.  2) metastatic brain lesions: We will continue to monitor this closely. An MRI of the brain was scheduled for the patient and pending these results we will consider a referral for whole brain irradiation at that time if she continues to have progressive disease in the brain. She was evaluated by Dr. Sondra Come.  She was given a refill prescription for her Percocet 7.5/325 mg tablets and a refill prescription for Delsym was sent to her pharmacy of record via [place].  The patient was advised to call immediately if she has any concerning symptoms in the interval. The patient voices understanding of current disease status and treatment options and is in agreement with the current care plan.  All questions were answered. The patient knows to call the clinic with any problems, questions or concerns. We can certainly see the patient much sooner if necessary.  Carlton Adam, PA-C 11/18/2014

## 2014-11-22 ENCOUNTER — Other Ambulatory Visit: Payer: Medicaid Other

## 2014-11-22 ENCOUNTER — Telehealth: Payer: Self-pay | Admitting: Medical Oncology

## 2014-11-22 NOTE — Telephone Encounter (Signed)
I left a voice message for pt to keep scan appts for tomorrow but that she does not need to drink contrast .

## 2014-11-22 NOTE — Patient Instructions (Signed)
Return in one week to discuss the results of your restaging CT and MRI scans

## 2014-11-23 ENCOUNTER — Ambulatory Visit (HOSPITAL_COMMUNITY): Admission: RE | Admit: 2014-11-23 | Payer: Medicaid Other | Source: Ambulatory Visit

## 2014-11-23 ENCOUNTER — Ambulatory Visit (HOSPITAL_COMMUNITY): Payer: Medicaid Other

## 2014-11-24 ENCOUNTER — Telehealth: Payer: Self-pay | Admitting: Medical Oncology

## 2014-11-24 ENCOUNTER — Other Ambulatory Visit: Payer: Self-pay | Admitting: Medical Oncology

## 2014-11-24 NOTE — Telephone Encounter (Signed)
Son called to r/s scan and I transferred him to radiology . He called me back and said insurance denied scan so he could not schedule it. Note to [employer]

## 2014-11-25 ENCOUNTER — Other Ambulatory Visit: Payer: Self-pay | Admitting: Medical Oncology

## 2014-11-25 NOTE — Telephone Encounter (Signed)
Per [facility] Ct abd/pelvis not approved . Chest and MRI brain approved. I extended dates on those orders. I called son and no voice mail set up. I called pt and left voice mail to call me regarding appts tomorrow and r/s scans.

## 2014-11-26 ENCOUNTER — Other Ambulatory Visit: Payer: Medicaid Other

## 2014-11-26 ENCOUNTER — Ambulatory Visit: Payer: Medicaid Other | Admitting: Internal Medicine

## 2014-11-26 ENCOUNTER — Other Ambulatory Visit: Payer: Self-pay | Admitting: Medical Oncology

## 2014-11-26 NOTE — Telephone Encounter (Signed)
I spoke to pt to r/s her scan and told her she did not need to drink contrast . I transferred her call to central scheduling because of her transportation issue. I also notified her son of update and told him to check with his mom about appt.

## 2014-11-29 ENCOUNTER — Other Ambulatory Visit: Payer: Medicaid Other

## 2014-11-29 ENCOUNTER — Telehealth: Payer: Self-pay | Admitting: *Deleted

## 2014-11-29 NOTE — Telephone Encounter (Signed)
Pt son Berline Chough called regarding pt missed MRI and CT scan on 4/12 due to transportation did not pick her up.  Called Scheduling and r/s for 4/27 MRI at 12:45, CT to follow. Called Rashawn back with appt information, voice mail was not set up, unable to reach or leave a message. Called pt at home # spoke with pt, gave appt information and instructions prior to MRI. Pt verbalized understanding will call transportation and give them new appt date and time. Pt advised she would also tell her son. No further concerns.

## 2014-11-30 ENCOUNTER — Telehealth: Payer: Self-pay | Admitting: Internal Medicine

## 2014-11-30 NOTE — Telephone Encounter (Signed)
Spoke with patient to confirm all appointments for April. Mailed calendar for patient to arrange transportation. Also tried calling the son and was not able to leave voicemail.

## 2014-12-05 ENCOUNTER — Inpatient Hospital Stay (HOSPITAL_COMMUNITY)
Admission: EM | Admit: 2014-12-05 | Discharge: 2014-12-12 | DRG: 871 | Disposition: E | Payer: Medicaid Other | Attending: Internal Medicine | Admitting: Internal Medicine

## 2014-12-05 ENCOUNTER — Emergency Department (HOSPITAL_COMMUNITY): Payer: Medicaid Other

## 2014-12-05 ENCOUNTER — Encounter (HOSPITAL_COMMUNITY): Payer: Self-pay | Admitting: Emergency Medicine

## 2014-12-05 DIAGNOSIS — G92 Toxic encephalopathy: Secondary | ICD-10-CM | POA: Diagnosis present

## 2014-12-05 DIAGNOSIS — L02818 Cutaneous abscess of other sites: Secondary | ICD-10-CM | POA: Diagnosis present

## 2014-12-05 DIAGNOSIS — R627 Adult failure to thrive: Secondary | ICD-10-CM | POA: Diagnosis present

## 2014-12-05 DIAGNOSIS — Z9221 Personal history of antineoplastic chemotherapy: Secondary | ICD-10-CM | POA: Diagnosis not present

## 2014-12-05 DIAGNOSIS — E860 Dehydration: Secondary | ICD-10-CM | POA: Diagnosis present

## 2014-12-05 DIAGNOSIS — J9601 Acute respiratory failure with hypoxia: Secondary | ICD-10-CM | POA: Diagnosis present

## 2014-12-05 DIAGNOSIS — Z809 Family history of malignant neoplasm, unspecified: Secondary | ICD-10-CM | POA: Diagnosis not present

## 2014-12-05 DIAGNOSIS — R6520 Severe sepsis without septic shock: Secondary | ICD-10-CM | POA: Diagnosis present

## 2014-12-05 DIAGNOSIS — Z823 Family history of stroke: Secondary | ICD-10-CM

## 2014-12-05 DIAGNOSIS — T451X5A Adverse effect of antineoplastic and immunosuppressive drugs, initial encounter: Secondary | ICD-10-CM | POA: Diagnosis present

## 2014-12-05 DIAGNOSIS — T380X5A Adverse effect of glucocorticoids and synthetic analogues, initial encounter: Secondary | ICD-10-CM | POA: Diagnosis present

## 2014-12-05 DIAGNOSIS — Z66 Do not resuscitate: Secondary | ICD-10-CM | POA: Diagnosis present

## 2014-12-05 DIAGNOSIS — E876 Hypokalemia: Secondary | ICD-10-CM | POA: Diagnosis present

## 2014-12-05 DIAGNOSIS — C3490 Malignant neoplasm of unspecified part of unspecified bronchus or lung: Secondary | ICD-10-CM | POA: Diagnosis not present

## 2014-12-05 DIAGNOSIS — D6481 Anemia due to antineoplastic chemotherapy: Secondary | ICD-10-CM | POA: Diagnosis present

## 2014-12-05 DIAGNOSIS — K630 Abscess of intestine: Secondary | ICD-10-CM | POA: Diagnosis present

## 2014-12-05 DIAGNOSIS — I959 Hypotension, unspecified: Secondary | ICD-10-CM | POA: Diagnosis present

## 2014-12-05 DIAGNOSIS — C7931 Secondary malignant neoplasm of brain: Secondary | ICD-10-CM | POA: Diagnosis present

## 2014-12-05 DIAGNOSIS — C3491 Malignant neoplasm of unspecified part of right bronchus or lung: Secondary | ICD-10-CM | POA: Diagnosis not present

## 2014-12-05 DIAGNOSIS — F17210 Nicotine dependence, cigarettes, uncomplicated: Secondary | ICD-10-CM | POA: Diagnosis present

## 2014-12-05 DIAGNOSIS — Z79899 Other long term (current) drug therapy: Secondary | ICD-10-CM | POA: Diagnosis not present

## 2014-12-05 DIAGNOSIS — K631 Perforation of intestine (nontraumatic): Secondary | ICD-10-CM | POA: Diagnosis present

## 2014-12-05 DIAGNOSIS — A419 Sepsis, unspecified organism: Principal | ICD-10-CM | POA: Diagnosis present

## 2014-12-05 DIAGNOSIS — N281 Cyst of kidney, acquired: Secondary | ICD-10-CM | POA: Diagnosis present

## 2014-12-05 DIAGNOSIS — E871 Hypo-osmolality and hyponatremia: Secondary | ICD-10-CM | POA: Diagnosis not present

## 2014-12-05 DIAGNOSIS — Z7982 Long term (current) use of aspirin: Secondary | ICD-10-CM

## 2014-12-05 DIAGNOSIS — E46 Unspecified protein-calorie malnutrition: Secondary | ICD-10-CM | POA: Diagnosis present

## 2014-12-05 DIAGNOSIS — Z515 Encounter for palliative care: Secondary | ICD-10-CM | POA: Diagnosis not present

## 2014-12-05 DIAGNOSIS — R109 Unspecified abdominal pain: Secondary | ICD-10-CM | POA: Diagnosis present

## 2014-12-05 DIAGNOSIS — IMO0002 Reserved for concepts with insufficient information to code with codable children: Secondary | ICD-10-CM | POA: Diagnosis present

## 2014-12-05 DIAGNOSIS — R631 Polydipsia: Secondary | ICD-10-CM | POA: Diagnosis not present

## 2014-12-05 DIAGNOSIS — K651 Peritoneal abscess: Secondary | ICD-10-CM | POA: Diagnosis present

## 2014-12-05 DIAGNOSIS — I2510 Atherosclerotic heart disease of native coronary artery without angina pectoris: Secondary | ICD-10-CM | POA: Diagnosis present

## 2014-12-05 DIAGNOSIS — Z6829 Body mass index (BMI) 29.0-29.9, adult: Secondary | ICD-10-CM

## 2014-12-05 DIAGNOSIS — D649 Anemia, unspecified: Secondary | ICD-10-CM | POA: Diagnosis present

## 2014-12-05 DIAGNOSIS — C3401 Malignant neoplasm of right main bronchus: Secondary | ICD-10-CM | POA: Diagnosis present

## 2014-12-05 DIAGNOSIS — L0291 Cutaneous abscess, unspecified: Secondary | ICD-10-CM

## 2014-12-05 HISTORY — DX: Malignant neoplasm of unspecified part of unspecified bronchus or lung: C34.90

## 2014-12-05 LAB — CBC WITH DIFFERENTIAL/PLATELET
Basophils Absolute: 0 10*3/uL (ref 0.0–0.1)
Basophils Relative: 0 % (ref 0–1)
Eosinophils Absolute: 0 10*3/uL (ref 0.0–0.7)
Eosinophils Relative: 0 % (ref 0–5)
HEMATOCRIT: 29.7 % — AB (ref 36.0–46.0)
Hemoglobin: 9.6 g/dL — ABNORMAL LOW (ref 12.0–15.0)
LYMPHS ABS: 1.4 10*3/uL (ref 0.7–4.0)
Lymphocytes Relative: 8 % — ABNORMAL LOW (ref 12–46)
MCH: 30.3 pg (ref 26.0–34.0)
MCHC: 32.3 g/dL (ref 30.0–36.0)
MCV: 93.7 fL (ref 78.0–100.0)
MONOS PCT: 9 % (ref 3–12)
Monocytes Absolute: 1.5 10*3/uL — ABNORMAL HIGH (ref 0.1–1.0)
Neutro Abs: 13.5 10*3/uL — ABNORMAL HIGH (ref 1.7–7.7)
Neutrophils Relative %: 83 % — ABNORMAL HIGH (ref 43–77)
PLATELETS: 282 10*3/uL (ref 150–400)
RBC: 3.17 MIL/uL — ABNORMAL LOW (ref 3.87–5.11)
RDW: 19.2 % — AB (ref 11.5–15.5)
WBC: 16.4 10*3/uL — AB (ref 4.0–10.5)

## 2014-12-05 LAB — LIPASE, BLOOD: Lipase: 15 U/L (ref 11–59)

## 2014-12-05 LAB — I-STAT CG4 LACTIC ACID, ED: Lactic Acid, Venous: 1.49 mmol/L (ref 0.5–2.0)

## 2014-12-05 MED ORDER — IPRATROPIUM-ALBUTEROL 0.5-2.5 (3) MG/3ML IN SOLN: 3.0000 mL | Freq: Four times a day (QID) | RESPIRATORY_TRACT | Status: DC | PRN

## 2014-12-05 MED ORDER — DEXAMETHASONE SODIUM PHOSPHATE 4 MG/ML IJ SOLN
4.0000 mg | Freq: Two times a day (BID) | INTRAMUSCULAR | Status: DC
  Administered 2014-12-06 – 2014-12-08 (×5): 4 mg via INTRAVENOUS
  Filled 2014-12-05 (×5): qty 1

## 2014-12-05 MED ORDER — PIPERACILLIN-TAZOBACTAM 3.375 G IVPB
3.3750 g | Freq: Three times a day (TID) | INTRAVENOUS | Status: DC
  Administered 2014-12-06 – 2014-12-08 (×9): 3.375 g via INTRAVENOUS
  Filled 2014-12-05 (×9): qty 50

## 2014-12-05 MED ORDER — POTASSIUM CHLORIDE CRYS ER 20 MEQ PO TBCR
40.0000 meq | EXTENDED_RELEASE_TABLET | Freq: Once | ORAL | Status: AC
  Administered 2014-12-05: 40 meq via ORAL
  Filled 2014-12-05: qty 2

## 2014-12-05 MED ORDER — ONDANSETRON HCL 4 MG/2ML IJ SOLN
4.0000 mg | Freq: Once | INTRAMUSCULAR | Status: AC
  Administered 2014-12-05: 4 mg via INTRAVENOUS
  Filled 2014-12-05: qty 2

## 2014-12-05 MED ORDER — VANCOMYCIN HCL IN DEXTROSE 1-5 GM/200ML-% IV SOLN
1000.0000 mg | Freq: Two times a day (BID) | INTRAVENOUS | Status: DC
  Administered 2014-12-06 (×3): 1000 mg via INTRAVENOUS
  Filled 2014-12-05 (×3): qty 200

## 2014-12-05 MED ORDER — SODIUM CHLORIDE 0.9 % IV BOLUS (SEPSIS)
1000.0000 mL | Freq: Once | INTRAVENOUS | Status: AC
  Administered 2014-12-05: 1000 mL via INTRAVENOUS

## 2014-12-05 MED ORDER — SODIUM CHLORIDE 0.9 % IV SOLN
INTRAVENOUS | Status: DC
  Administered 2014-12-06: 01:00:00 via INTRAVENOUS

## 2014-12-05 MED ORDER — IOHEXOL 300 MG/ML  SOLN
100.0000 mL | Freq: Once | INTRAMUSCULAR | Status: AC | PRN
  Administered 2014-12-05: 100 mL via INTRAVENOUS

## 2014-12-05 MED ORDER — HYDROMORPHONE HCL 1 MG/ML IJ SOLN
1.0000 mg | Freq: Once | INTRAMUSCULAR | Status: AC
  Administered 2014-12-05: 1 mg via INTRAVENOUS
  Filled 2014-12-05: qty 1

## 2014-12-05 MED ORDER — POTASSIUM CHLORIDE 10 MEQ/100ML IV SOLN
10.0000 meq | INTRAVENOUS | Status: AC
  Administered 2014-12-06 (×3): 10 meq via INTRAVENOUS
  Filled 2014-12-05 (×3): qty 100

## 2014-12-05 MED ORDER — POTASSIUM CHLORIDE 10 MEQ/100ML IV SOLN
10.0000 meq | Freq: Once | INTRAVENOUS | Status: AC
  Administered 2014-12-05: 10 meq via INTRAVENOUS
  Filled 2014-12-05: qty 100

## 2014-12-05 MED ORDER — IOHEXOL 300 MG/ML  SOLN
25.0000 mL | Freq: Once | INTRAMUSCULAR | Status: AC | PRN
  Administered 2014-12-05: 25 mL via ORAL

## 2014-12-05 MED ORDER — SODIUM CHLORIDE 0.9 % IV SOLN
INTRAVENOUS | Status: DC
  Administered 2014-12-05: 18:00:00 via INTRAVENOUS

## 2014-12-05 MED ORDER — SODIUM CHLORIDE 0.9 % IV BOLUS (SEPSIS)
1000.0000 mL | Freq: Once | INTRAVENOUS | Status: AC
Start: 2014-12-05 — End: 2014-12-06
  Administered 2014-12-06: 1000 mL via INTRAVENOUS

## 2014-12-05 NOTE — Consult Note (Signed)
Re:   Mariah Park DOB:   [birth year] MRN:   [id number]  ASSESSMENT AND PLAN: 1.  Perforated bowel  Suspected to be in hepatic flexure of colon  6.6 x 4.3 cm abscess at splenic flexure of colon.  Whether this is related to her lung cancer vs her chemotherapy vs a primary colon process unrelated to her other medical problems is unclear.  Extensive discussion with patient and her only son, Mariah Park.  Options include:  1) medical support, 2) perc drain of abscess and medical support, 3) operative exploration.  At this time, the patient is refusing surgery.  Her son wants her to have surgery, but she is capable of making her own decisions.    I do not think much is lost in waiting a few more hours and reassessing her in the AM.  It is 2:30 AM right now.    In the morning, possibly Dr. Julien Nordmann, her treating oncologist, could weigh in on her prognosis and talk to her.  I have not contacted oncology.  Dr. Zella Richer is our surgeon of the week for [facility] and I will discuss her case with in this AM.  2.  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  Mediastinal lymphadenopathy and metastatic lesion in the subcutaneous fat of the left flank  She has been on carboplatin/etoposide - first started on 07/05/2014.  It appears that she has tolerated the chemotherapy fairly well.  Oncologist - Melina Modena and M. Mohamed  3.  Brain mets  MRI of brain - 06/18/2014 - 4 met deposits seen.  This is the last brain image I can find. She is for a brain MR on 12/25/14.  She has been on decadron - 4 mg BID  As best I can tell, she has not had irradiation to her brain.  The family is somewhat unsure.  4.  CAD - atherosclerosis 5.  Anemia -  Hgb - 9.6 - 11/17/2014 6.  Malnutrition  Alb - 2.3 - 11/30/2014 7.  Hypokalemia  K+ - 2.2 - 12/02/2014   Chief Complaint  Patient presents with  . Abdominal Pain  . Cancer    REFERRING PHYSICIAN: No PCP Per Patient  HISTORY OF PRESENT ILLNESS: Mariah Park is a 59 y.o. (DOB: 05-30-1956)  AA  female whose primary care physician is No PCP Per Patient and comes to [facility] ER today for abdominal pain. Her son, Mariah Park, is at her bedside.    She has been sick at home for at least 3 days. She and her son live together.  Finally today she let her son bring her to the [facility].   She has had some constipation, but she has no chronic GI problems.  She has had no abdominal surgery.  She has no stomach, liver, or colon disease.  She has not had a colonoscopy.   She has extensive small cell lung cancer diagnosed Nov 2015 with large right hilar mass and direct mediastinal invasion.  She also has mediastinal lymphadenopathy, metastatic lesion in the subcutaneous fat of the left flank, and known brain mets.  She has been on carboplatin/etoposide - first started on 07/05/2014.  Neither the patient or the son are the best of historians.  It looks like she presented to the ER in October 2015 with progressive SOB and chest pressure that had been going on for 3 months.  She had smoked about 5  cigarettes a day for over 20 years.  At that time, she did not have regular medical contact.  Her initial consultation with Dr. Julien Nordmann was 06/09/2014.  Abdominal/pelvic CT - 12/02/2014 - 1. Unusual acute colonic process with multiple wall abscesses along the ascending, transverse, proximal descending and sigmoid colon. There is only mild inflammation about the remainder of the colon, and is of uncertain significance. It could reflect an acute necrotic colitis, or possibly underlying masses, not well seen, though there may have been masses along the hepatic flexure of the colon on the prior CT.  2. Bowel perforation with diffuse free air tracking about the medial aspect of the liver and superior to the right kidney, extending under the diaphragm. The bubbly appearance of the air raises concern for  underlying necrotizing infection. The location of bowel perforation is suspected to be the hepatic flexure of the colon, as fluid and air about the gallbladder appears to extend from an abscess at the hepatic flexure.  3. Largest abscess, at the hepatic flexure of the colon and proximal transverse colon, measures 6.6 x 4.3 cm. 6.0 cm abscess noted at the splenic flexure of colon. Apparent two small wall abscess noted along the sigmoid colon, measuring 2.5 cm and 1.7 cm in size.  4. 1.6 cm mildly hypoattenuating focus on delayed images at the posterior aspect of the right kidney. This could reflect mild focal pyelonephritis, or possibly a poorly characterized isodense cyst. Follow-up renal ultrasound could be considered on an elective nonemergent basis. 2.5 cm left renal cyst noted.  5. 3.4 cm right adnexal cyst is likely physiologic, though pelvic ultrasound could be considered for further evaluation on an elective nonemergent basis.  6. Scattered calcification along the abdominal aorta and its branches.  7. Scattered diverticulosis along the entirety of the colon, without evidence of diverticulitis.  CBC - 11/27/2014 - 16,400    Past Medical History  Diagnosis Date  . Brain metastases 06/21/2014  . Lung cancer      History reviewed. No pertinent past surgical history.    Current Facility-Administered Medications  Medication Dose Route Frequency Provider Last Rate Last Dose  . 0.9 %  sodium chloride infusion   Intravenous Continuous Lacretia Leigh, MD 125 mL/hr at 11/13/2014 1802     Current Outpatient Prescriptions  Medication Sig Dispense Refill  . aspirin 81 MG tablet Take 81 mg by mouth daily as needed for pain (chest pain).    . calcium-vitamin D (OSCAL WITH D) 500-200 MG-UNIT per tablet Take 1 tablet by mouth 2 (two) times daily. 60 tablet 0  . dexamethasone (DECADRON) 4 MG tablet Take 1 tablet (4 mg total) by mouth 2 (two) times daily with a meal. 60 tablet 0  . dextromethorphan  (DELSYM) 30 MG/5ML liquid Take 5 mLs (30 mg total) by mouth every 12 (twelve) hours as needed for cough. 500 mL 1  . gabapentin (NEURONTIN) 800 MG tablet Take 800 mg by mouth daily as needed (pain).    Marland Kitchen ibuprofen (ADVIL,MOTRIN) 200 MG tablet Take 400 mg by mouth every 6 (six) hours as needed for moderate pain (pain).    Marland Kitchen ipratropium-albuterol (DUONEB) 0.5-2.5 (3) MG/3ML SOLN Take 3 mLs by nebulization every 6 (six) hours as needed. 360 mL 1  . Multiple Vitamins-Minerals (MULTIVITAMIN WITH MINERALS) tablet Take 1 tablet by mouth daily.    Marland Kitchen oxyCODONE-acetaminophen (PERCOCET) 7.5-325 MG per tablet Take 1 tablet by mouth every 6 (six) hours as needed for pain. 30 tablet 0  . potassium  chloride SA (K-DUR,KLOR-CON) 20 MEQ tablet TAKE 1 TABLET BY MOUTH EVERY DAY 7 tablet 1  . ondansetron (ZOFRAN) 8 MG tablet Take 1 tablet (8 mg total) by mouth every 8 (eight) hours as needed for nausea or vomiting. (Patient not taking: Reported on 12/06/2014) 30 tablet 0  . prochlorperazine (COMPAZINE) 10 MG tablet Take 1 tablet (10 mg total) by mouth every 6 (six) hours as needed for nausea or vomiting. (Patient not taking: Reported on 11/16/2014) 30 tablet 0     No Known Allergies  REVIEW OF SYSTEMS: Skin:  No history of rash.  No history of abnormal moles. Infection:  No history of hepatitis or HIV.  No history of MRSA. Neurologic:  Brain mets on MRI in November.  These appear to be asymptomatic. Cardiac:  No history of hypertension. No history of heart disease.  No history of prior cardiac catheterization.  No history of seeing a cardiologist. Pulmonary:  Advanced right lung cancer.  See HPI.  Endocrine:  No diabetes. No thyroid disease. Gastrointestinal:  See HPI Urologic:  No history of kidney stones.  No history of bladder infections. Musculoskeletal:  No history of joint or back disease. Hematologic:  No bleeding disorder.  No history of anemia.  Not anticoagulated. Psycho-social:  The patient is oriented.    The patient has no obvious psychologic or social impairment to understanding our conversation and plan.  SOCIAL and FAMILY HISTORY: Unmarried. She worked up until the diagnosis of lung cancer.  She is on disability now. She has one son, Mariah Park [phone]).  He was hit by a train many years ago and is a double amputee.  He is on disability. Mariah Park, sister, Mariah Park, niece, and Mariah Park, 1st cousin were in waiting room.  PHYSICAL EXAM: BP 97/60 mmHg  Pulse 97  Temp(Src) 98.2 F (36.8 C) (Oral)  Resp 14  SpO2 99%  General: Older AA F who is alert.  She looks older than her stated age. HEENT: Normal. Pupils equal. Neck: Supple. No mass.  No thyroid mass. Lymph Nodes:  No supraclavicular or cervical nodes.  Lungs: Clear to auscultation.  Poor inspiratory effort secondary to abdominal pain. Heart:  RRR. No murmur or rub. Abdomen: Distended.  Quiet.  Diffuse tenderness with guarding and rebound. Rectal: Not done. Extremities:  Moves all extremities, but cannot assess strength. Neurologic:  Grossly intact to motor and sensory function. Psychiatric: She is alert and can answer questions.  She converses with her son.  DATA REVIEWED: Epic notes.  Alphonsa Overall, MD,  [facility], [address]  [place], [place]    [place] Phone:  [phone] FAX:  [phone]

## 2014-12-05 NOTE — ED Notes (Signed)
Pt states she has pain in her stomach, points to upper rt quad, denies n/v/d with discomfort.

## 2014-12-05 NOTE — ED Notes (Addendum)
Pt has a hx of lung CA and has been c/o generalized abdominal pain x 3 days. Sharp, stabbing. Alert and oriented. Diarrhea x 3 days.

## 2014-12-05 NOTE — ED Provider Notes (Addendum)
CSN: [id number]     Arrival date & time 12/02/2014  1650 History   First MD Initiated Contact with Patient 11/20/2014 1722     Chief Complaint  Patient presents with  . Abdominal Pain  . Cancer     (Consider location/radiation/quality/duration/timing/severity/associated sxs/prior Treatment) HPI Comments: She denies urinary sx  Patient is a 59 y.o. female presenting with abdominal pain. The history is provided by the patient.  Abdominal Pain Pain location:  RUQ Pain quality: fullness   Pain severity:  Moderate Onset quality:  Gradual Duration:  3 days Timing:  Constant Progression:  Worsening Chronicity:  New Context: not alcohol use   Relieved by:  Nothing Worsened by:  Nothing tried Ineffective treatments:  None tried Associated symptoms: nausea and vomiting   Associated symptoms: no diarrhea and no fever     Past Medical History  Diagnosis Date  . Brain metastases 06/21/2014  . Lung cancer    History reviewed. No pertinent past surgical history. Family History  Problem Relation Age of Onset  . Stroke Mother   . Cancer Sister    History  Substance Use Topics  . Smoking status: Former Smoker -- 0.50 packs/day    Types: Cigarettes    Quit date: 06/02/2014  . Smokeless tobacco: Not on file  . Alcohol Use: Yes   OB History    No data available     Review of Systems  Constitutional: Negative for fever.  Gastrointestinal: Positive for nausea, vomiting and abdominal pain. Negative for diarrhea.      Allergies  Review of patient's allergies indicates no known allergies.  Home Medications   Prior to Admission medications   Medication Sig Start Date End Date Taking? Authorizing Provider  aspirin 81 MG tablet Take 81 mg by mouth daily as needed for pain (chest pain).   Yes Historical Provider, MD  calcium-vitamin D (OSCAL WITH D) 500-200 MG-UNIT per tablet Take 1 tablet by mouth 2 (two) times daily. 10/25/14  Yes Maryanna Shape, NP  dexamethasone (DECADRON) 4  MG tablet Take 1 tablet (4 mg total) by mouth 2 (two) times daily with a meal. 11/01/14  Yes Curt Bears, MD  dextromethorphan (DELSYM) 30 MG/5ML liquid Take 5 mLs (30 mg total) by mouth every 12 (twelve) hours as needed for cough. 11/18/14  Yes Adrena E Johnson, PA-C  gabapentin (NEURONTIN) 800 MG tablet Take 800 mg by mouth daily as needed (pain).   Yes Historical Provider, MD  ibuprofen (ADVIL,MOTRIN) 200 MG tablet Take 400 mg by mouth every 6 (six) hours as needed for moderate pain (pain).   Yes Historical Provider, MD  ipratropium-albuterol (DUONEB) 0.5-2.5 (3) MG/3ML SOLN Take 3 mLs by nebulization every 6 (six) hours as needed. 08/02/14  Yes Curt Bears, MD  Multiple Vitamins-Minerals (MULTIVITAMIN WITH MINERALS) tablet Take 1 tablet by mouth daily.   Yes Historical Provider, MD  oxyCODONE-acetaminophen (PERCOCET) 7.5-325 MG per tablet Take 1 tablet by mouth every 6 (six) hours as needed for pain. 11/18/14  Yes Adrena E Johnson, PA-C  potassium chloride SA (K-DUR,KLOR-CON) 20 MEQ tablet TAKE 1 TABLET BY MOUTH EVERY DAY 11/13/14  Yes Curt Bears, MD  ondansetron (ZOFRAN) 8 MG tablet Take 1 tablet (8 mg total) by mouth every 8 (eight) hours as needed for nausea or vomiting. Patient not taking: Reported on 11/23/2014 11/12/14   Curt Bears, MD  prochlorperazine (COMPAZINE) 10 MG tablet Take 1 tablet (10 mg total) by mouth every 6 (six) hours as needed for nausea or vomiting.  Patient not taking: Reported on 11/27/2014 10/25/14   Maryanna Shape, NP   BP 96/69 mmHg  Pulse 98  Temp(Src) 98.2 F (36.8 C) (Oral)  Resp 20  SpO2 95% Physical Exam  Constitutional: She is oriented to person, place, and time. She appears well-developed and well-nourished.  Non-toxic appearance. No distress.  HENT:  Head: Normocephalic and atraumatic.  Eyes: Conjunctivae, EOM and lids are normal. Pupils are equal, round, and reactive to light.  Neck: Normal range of motion. Neck supple. No tracheal deviation  present. No thyroid mass present.  Cardiovascular: Normal rate, regular rhythm and normal heart sounds.  Exam reveals no gallop.   No murmur heard. Pulmonary/Chest: Effort normal and breath sounds normal. No stridor. No respiratory distress. She has no decreased breath sounds. She has no wheezes. She has no rhonchi. She has no rales.  Abdominal: Soft. Normal appearance and bowel sounds are normal. She exhibits no distension. There is tenderness in the right upper quadrant. There is no rebound and no CVA tenderness.    Musculoskeletal: Normal range of motion. She exhibits no edema or tenderness.  Neurological: She is alert and oriented to person, place, and time. She has normal strength. No cranial nerve deficit or sensory deficit. GCS eye subscore is 4. GCS verbal subscore is 5. GCS motor subscore is 6.  Skin: Skin is warm and dry. No abrasion and no rash noted.  Psychiatric: She has a normal mood and affect. Her speech is normal and behavior is normal.  Nursing note and vitals reviewed.   ED Course  Procedures (including critical care time) Labs Review Labs Reviewed  CBC WITH DIFFERENTIAL/PLATELET  COMPREHENSIVE METABOLIC PANEL  LIPASE, BLOOD  URINALYSIS, ROUTINE W REFLEX MICROSCOPIC    Imaging Review No results found.   EKG Interpretation   Date/Time:  Sunday December 05 2014 16:54:51 EDT Ventricular Rate:  96 PR Interval:  108 QRS Duration: 142 QT Interval:  402 QTC Calculation: 508 R Axis:   90 Text Interpretation:  Sinus tachycardia Paired ventricular premature  complexes Short PR interval Nonspecific intraventricular conduction delay  Baseline wander in lead(s) V5 V6 No significant change since last tracing  Confirmed by Dorean Daniello  MD, Mehr Depaoli ([id number]) on 11/28/2014 5:23:09 PM      MDM   Final diagnoses:  Abdominal pain    Patient given IV fluids and pain medication here. Abdominal CT results noted. I spoke with general surgeon on call, Dr. Lucia Gaskins, he requested patient  be admitted to the hospitalist service. Spoke with the family and the patient is a full code at this time. Patient was hypotensive. Given IV fluids. Patient be started on IV antibiotics per the hospitalist and admitted to step down. Patient's hypokalemia was treated with oral and IV potassium.  CRITICAL CARE Performed by: Leota Jacobsen Total critical care time: 50 Critical care time was exclusive of separately billable procedures and treating other patients. Critical care was necessary to treat or prevent imminent or life-threatening deterioration. Critical care was time spent personally by me on the following activities: development of treatment plan with patient and/or surrogate as well as nursing, discussions with consultants, evaluation of patient's response to treatment, examination of patient, obtaining history from patient or surrogate, ordering and performing treatments and interventions, ordering and review of laboratory studies, ordering and review of radiographic studies, pulse oximetry and re-evaluation of patient's condition.     Lacretia Leigh, MD 11/29/2014 0923  Lacretia Leigh, MD 01/01/15 [phone]

## 2014-12-05 NOTE — ED Notes (Signed)
Nurse will start 2nd IV and get labs.

## 2014-12-05 NOTE — ED Notes (Signed)
Put pt on the bedpan, she is unable to urinate at this time

## 2014-12-05 NOTE — Progress Notes (Signed)
ANTIBIOTIC CONSULT NOTE - INITIAL  Pharmacy Consult for Vancomycin & Zosyn Indication: Intra-abdominal infection/Sepsis  No Known Allergies  Patient Measurements: Weight: 163 lb 12.8 oz (74.3 kg) Height: 64 inches  Vital Signs: Temp: 98.2 F (36.8 C) (04/24 1651) Temp Source: Oral (04/24 1651) BP: 97/60 mmHg (04/24 2227) Pulse Rate: 97 (04/24 2227) Intake/Output from previous day:   Intake/Output from this shift:    Labs:  Recent Labs  11/14/2014 1805  WBC 16.4*  HGB 9.6*  PLT 282  CREATININE 0.67   Estimated Creatinine Clearance: 77.5 mL/min (by C-G formula based on Cr of 0.67). No results for input(s): VANCOTROUGH, VANCOPEAK, VANCORANDOM, GENTTROUGH, GENTPEAK, GENTRANDOM, TOBRATROUGH, TOBRAPEAK, TOBRARND, AMIKACINPEAK, AMIKACINTROU, AMIKACIN in the last 72 hours.   Microbiology: Recent Results (from the past 720 hour(s))  TECHNOLOGIST REVIEW     Status: None   Collection Time: 11/18/14  9:59 AM  Result Value Ref Range Status   Technologist Review Metas and Myelocytes present. 9% nrbc  Final    Medical History: Past Medical History  Diagnosis Date  . Brain metastases 06/21/2014  . Lung cancer     Medications:  Scheduled:  . [START ON 12/06/2014] dexamethasone  4 mg Intravenous Q12H  . [START ON 12/06/2014] piperacillin-tazobactam (ZOSYN)  IV  3.375 g Intravenous Q8H  . sodium chloride  1,000 mL Intravenous Once  . [START ON 12/06/2014] vancomycin  1,000 mg Intravenous Q12H   Infusions:  . sodium chloride 125 mL/hr at 12/03/2014 1802   Assessment:  59 yr female with h/o lung cancer presents with abdominal pain  CT abdomen shows bowel perforation, abscesses along colon  Pharmacy consulted to dose Vancomycin and Zosyn for intra-abdominal infection / sepsis  CrCl ~ 77 ml/min  Blood cultures ordered  Goal of Therapy:  Vancomycin trough level 15-20 mcg/ml  Plan:  Measure antibiotic drug levels at steady state Follow up culture results   Zosyn 3.375gm  IV q8h (each dose infused over 4 hrs)  Vancomycin 1gm IV q12h  Everette Rank, PharmD 11/19/2014,11:48 PM

## 2014-12-05 NOTE — ED Notes (Signed)
Pt has been told a urine sample is needed, will try again for a sample once the pt has had more fluids

## 2014-12-05 NOTE — ED Notes (Signed)
Pt immediately desat to 87% on room right after dilaudid admin. Placed 2 L [place] ,pt now 99%.

## 2014-12-06 ENCOUNTER — Other Ambulatory Visit: Payer: Medicaid Other

## 2014-12-06 ENCOUNTER — Ambulatory Visit (HOSPITAL_COMMUNITY): Payer: Medicaid Other

## 2014-12-06 ENCOUNTER — Encounter (HOSPITAL_COMMUNITY): Payer: Self-pay | Admitting: Radiology

## 2014-12-06 DIAGNOSIS — A419 Sepsis, unspecified organism: Principal | ICD-10-CM

## 2014-12-06 DIAGNOSIS — E871 Hypo-osmolality and hyponatremia: Secondary | ICD-10-CM | POA: Diagnosis present

## 2014-12-06 DIAGNOSIS — E876 Hypokalemia: Secondary | ICD-10-CM

## 2014-12-06 DIAGNOSIS — R631 Polydipsia: Secondary | ICD-10-CM

## 2014-12-06 DIAGNOSIS — IMO0002 Reserved for concepts with insufficient information to code with codable children: Secondary | ICD-10-CM | POA: Diagnosis present

## 2014-12-06 DIAGNOSIS — D649 Anemia, unspecified: Secondary | ICD-10-CM | POA: Diagnosis present

## 2014-12-06 DIAGNOSIS — D72829 Elevated white blood cell count, unspecified: Secondary | ICD-10-CM

## 2014-12-06 DIAGNOSIS — C7931 Secondary malignant neoplasm of brain: Secondary | ICD-10-CM

## 2014-12-06 DIAGNOSIS — D6481 Anemia due to antineoplastic chemotherapy: Secondary | ICD-10-CM

## 2014-12-06 DIAGNOSIS — K631 Perforation of intestine (nontraumatic): Secondary | ICD-10-CM

## 2014-12-06 DIAGNOSIS — C3491 Malignant neoplasm of unspecified part of right bronchus or lung: Secondary | ICD-10-CM

## 2014-12-06 DIAGNOSIS — E46 Unspecified protein-calorie malnutrition: Secondary | ICD-10-CM

## 2014-12-06 DIAGNOSIS — K630 Abscess of intestine: Secondary | ICD-10-CM

## 2014-12-06 LAB — URINALYSIS, ROUTINE W REFLEX MICROSCOPIC
Bilirubin Urine: NEGATIVE
GLUCOSE, UA: NEGATIVE mg/dL
Hgb urine dipstick: NEGATIVE
Ketones, ur: NEGATIVE mg/dL
Leukocytes, UA: NEGATIVE
Nitrite: NEGATIVE
PH: 5.5 (ref 5.0–8.0)
Protein, ur: NEGATIVE mg/dL
Specific Gravity, Urine: 1.028 (ref 1.005–1.030)
UROBILINOGEN UA: 0.2 mg/dL (ref 0.0–1.0)

## 2014-12-06 LAB — COMPREHENSIVE METABOLIC PANEL
ALK PHOS: 128 U/L — AB (ref 39–117)
ALT: 22 U/L (ref 0–35)
AST: 19 U/L (ref 0–37)
Albumin: 2.3 g/dL — ABNORMAL LOW (ref 3.5–5.2)
Anion gap: 11 (ref 5–15)
BILIRUBIN TOTAL: 0.3 mg/dL (ref 0.3–1.2)
BUN: 24 mg/dL — ABNORMAL HIGH (ref 6–23)
CO2: 25 mmol/L (ref 19–32)
CREATININE: 0.67 mg/dL (ref 0.50–1.10)
Calcium: 8.2 mg/dL — ABNORMAL LOW (ref 8.4–10.5)
Chloride: 97 mmol/L (ref 96–112)
GFR calc non Af Amer: >90 mL/min (ref >90)
GLUCOSE: 159 mg/dL — AB (ref 70–99)
POTASSIUM: 2.2 mmol/L — AB (ref 3.5–5.1)
Sodium: 133 mmol/L — ABNORMAL LOW (ref 135–145)
Total Protein: 6.2 g/dL (ref 6.0–8.3)

## 2014-12-06 LAB — BASIC METABOLIC PANEL
Anion gap: 10 (ref 5–15)
BUN: 17 mg/dL (ref 6–23)
CALCIUM: 7.7 mg/dL — AB (ref 8.4–10.5)
CO2: 23 mmol/L (ref 19–32)
Chloride: 103 mmol/L (ref 96–112)
Creatinine, Ser: 0.56 mg/dL (ref 0.50–1.10)
GFR calc non Af Amer: >90 mL/min (ref >90)
Glucose, Bld: 120 mg/dL — ABNORMAL HIGH (ref 70–99)
Potassium: 2.4 mmol/L — CL (ref 3.5–5.1)
SODIUM: 136 mmol/L (ref 135–145)

## 2014-12-06 LAB — CBC
HEMATOCRIT: 28.5 % — AB (ref 36.0–46.0)
Hemoglobin: 9.1 g/dL — ABNORMAL LOW (ref 12.0–15.0)
MCH: 30.2 pg (ref 26.0–34.0)
MCHC: 31.9 g/dL (ref 30.0–36.0)
MCV: 94.7 fL (ref 78.0–100.0)
Platelets: 299 10*3/uL (ref 150–400)
RBC: 3.01 MIL/uL — ABNORMAL LOW (ref 3.87–5.11)
RDW: 19.4 % — AB (ref 11.5–15.5)
WBC: 11.5 10*3/uL — AB (ref 4.0–10.5)

## 2014-12-06 LAB — APTT: APTT: 40 s — AB (ref 24–37)

## 2014-12-06 LAB — PROTIME-INR
INR: 1.25 (ref 0.00–1.49)
PROTHROMBIN TIME: 15.8 s — AB (ref 11.6–15.2)

## 2014-12-06 LAB — MAGNESIUM: MAGNESIUM: 1.3 mg/dL — AB (ref 1.5–2.5)

## 2014-12-06 LAB — PROCALCITONIN: Procalcitonin: 1.92 ng/mL

## 2014-12-06 LAB — PREPARE RBC (CROSSMATCH)

## 2014-12-06 LAB — POTASSIUM: POTASSIUM: 2.4 mmol/L — AB (ref 3.5–5.1)

## 2014-12-06 LAB — ABO/RH: ABO/RH(D): B POS

## 2014-12-06 LAB — MRSA PCR SCREENING: MRSA by PCR: NEGATIVE

## 2014-12-06 MED ORDER — POTASSIUM CHLORIDE 10 MEQ/100ML IV SOLN
10.0000 meq | INTRAVENOUS | Status: AC
  Administered 2014-12-06 (×4): 10 meq via INTRAVENOUS
  Filled 2014-12-06 (×4): qty 100

## 2014-12-06 MED ORDER — SODIUM CHLORIDE 0.9 % IV SOLN
INTRAVENOUS | Status: DC
  Administered 2014-12-06 – 2014-12-07 (×3): via INTRAVENOUS

## 2014-12-06 MED ORDER — FENTANYL CITRATE (PF) 100 MCG/2ML IJ SOLN
INTRAMUSCULAR | Status: AC
Start: 2014-12-06 — End: 2014-12-06
  Filled 2014-12-06: qty 4

## 2014-12-06 MED ORDER — MIDAZOLAM HCL 2 MG/2ML IJ SOLN
INTRAMUSCULAR | Status: AC | PRN
  Administered 2014-12-06 (×6): 0.5 mg via INTRAVENOUS

## 2014-12-06 MED ORDER — FENTANYL CITRATE (PF) 100 MCG/2ML IJ SOLN
INTRAMUSCULAR | Status: AC | PRN
  Administered 2014-12-06 (×2): 25 ug via INTRAVENOUS

## 2014-12-06 MED ORDER — MAGNESIUM SULFATE 2 GM/50ML IV SOLN
2.0000 g | Freq: Once | INTRAVENOUS | Status: AC
  Administered 2014-12-06: 2 g via INTRAVENOUS
  Filled 2014-12-06: qty 50

## 2014-12-06 MED ORDER — ACETAMINOPHEN 650 MG RE SUPP
650.0000 mg | RECTAL | Status: DC | PRN
  Administered 2014-12-06: 650 mg via RECTAL
  Filled 2014-12-06: qty 1

## 2014-12-06 MED ORDER — SODIUM CHLORIDE 0.9 % IV BOLUS (SEPSIS)
500.0000 mL | Freq: Once | INTRAVENOUS | Status: AC
  Administered 2014-12-06: 500 mL via INTRAVENOUS

## 2014-12-06 MED ORDER — MIDAZOLAM HCL 2 MG/2ML IJ SOLN
INTRAMUSCULAR | Status: AC
  Filled 2014-12-06: qty 6

## 2014-12-06 MED ORDER — SODIUM CHLORIDE 0.9 % IV SOLN: Freq: Once | INTRAVENOUS | Status: DC

## 2014-12-06 MED ORDER — HYDROMORPHONE HCL 1 MG/ML IJ SOLN
1.0000 mg | INTRAMUSCULAR | Status: DC | PRN
  Administered 2014-12-06 – 2014-12-08 (×8): 1 mg via INTRAVENOUS
  Filled 2014-12-06 (×8): qty 1

## 2014-12-06 NOTE — Procedures (Signed)
Abd abscess times two 12 Fr. Pus from each No comp

## 2014-12-06 NOTE — Progress Notes (Signed)
CARE MANAGEMENT NOTE 12/06/2014  Patient:  ANGELISA, WINTHROP   Account Number:  [id number]  Date Initiated:  12/06/2014  Documentation initiated by:  Janaiya Beauchesne  Subjective/Objective Assessment:   sepsis     Action/Plan:   home when stable   Anticipated DC Date:  12/09/2014   Anticipated DC Plan:  HOME/SELF CARE  In-house referral  NA      DC Planning Services  NA      [facility] Choice  NA   Choice offered to / List presented to:  NA      DME agency  NA        [place] agency  NA   Status of service:  In process, will continue to follow Medicare Important Message given?   (If response is "NO", the following Medicare IM given date fields will be blank) Date Medicare IM given:   Medicare IM given by:   Date Additional Medicare IM given:   Additional Medicare IM given by:    Discharge Disposition:    Per UR Regulation:  Reviewed for med. necessity/level of care/duration of stay  If discussed at [place] of Stay Meetings, dates discussed:    Comments:  December 06, 2014/Kensli Bowley L. Rosana Hoes, RN, BSN, CCM. Case Management [place] [phone] No discharge needs present of time of review.

## 2014-12-06 NOTE — H&P (Signed)
[facility] History and Physical  Mariah Park:[id number] DOB: 05/07/1956 DOA: 11/22/2014  Referring physician: EDP PCP: No PCP Per Patient   Chief Complaint: brought in by her son for abdominal pain.   HPI: Mariah Park is a 59 y.o. 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 Patient is sedated with pain meds and detailed history could not be obtained. She would briefly wake up to the verbal cues and go back to sleep. He reports vomiting, unclear if she had nausea. On arrival to ED, she was found to have borderline BP, hypoxic and her CT abdomen revealed multiple wall abscesses along the ascending, transverse and proximal descending and sigmoid colon , with bowel perforation and free air. Surgery was consulted by EDP. Her lab work revealed severe hypokalemia  Of 2.2 and hyponatremia, leukocytosis and a hemoglobin of 9.6. Surprisingly her lactic acid level is 1.49. She was referred for admission to medical service.     Review of Systems:  Could not be obtained. Most of the history obtained from the son at bedside.   Past Medical History  Diagnosis Date  . Brain metastases 06/21/2014  . Lung cancer    History reviewed. No pertinent past surgical history. Social History:  reports that she quit smoking about 6 months ago. Her smoking use included Cigarettes. She smoked 0.50 packs per day. She does not have any smokeless tobacco history on file. She reports that she drinks alcohol. She reports that she uses illicit drugs (Marijuana) about 14 times per week.  No Known Allergies  Family History  Problem Relation Age of Onset  . Stroke Mother   . Cancer Sister     Prior to Admission medications   Medication Sig Start Date End Date Taking? Authorizing  Provider  aspirin 81 MG tablet Take 81 mg by mouth daily as needed for pain (chest pain).   Yes Historical Provider, MD  calcium-vitamin D (OSCAL WITH D) 500-200 MG-UNIT per tablet Take 1 tablet by mouth 2 (two) times daily. 10/25/14  Yes Maryanna Shape, NP  dexamethasone (DECADRON) 4 MG tablet Take 1 tablet (4 mg total) by mouth 2 (two) times daily with a meal. 11/01/14  Yes Curt Bears, MD  dextromethorphan (DELSYM) 30 MG/5ML liquid Take 5 mLs (30 mg total) by mouth every 12 (twelve) hours as needed for cough. 11/18/14  Yes Adrena E Johnson, PA-C  gabapentin (NEURONTIN) 800 MG tablet Take 800 mg by mouth daily as needed (pain).   Yes Historical Provider, MD  ibuprofen (ADVIL,MOTRIN) 200 MG tablet Take 400 mg by mouth every 6 (six) hours as needed for moderate pain (pain).   Yes Historical Provider, MD  ipratropium-albuterol (DUONEB) 0.5-2.5 (3) MG/3ML SOLN Take 3 mLs by nebulization every 6 (six) hours as needed. 08/02/14  Yes Curt Bears, MD  Multiple Vitamins-Minerals (MULTIVITAMIN WITH MINERALS) tablet Take 1 tablet by mouth daily.   Yes Historical Provider, MD  oxyCODONE-acetaminophen (PERCOCET) 7.5-325 MG per tablet Take 1 tablet by mouth every 6 (six) hours as needed for pain. 11/18/14  Yes Adrena E Johnson, PA-C  potassium chloride SA (K-DUR,KLOR-CON) 20 MEQ tablet TAKE 1 TABLET BY MOUTH EVERY DAY 11/13/14  Yes Curt Bears, MD   Physical Exam: Filed Vitals:   12/01/2014 1756 11/12/2014 1920  12/03/2014 2227 11/24/2014 2346  BP:  101/69 97/60   Pulse:  90 97   Temp:      TempSrc:      Resp:  14 14   Weight:    74.3 kg (163 lb 12.8 oz)  SpO2: 99% 98% 99%     Wt Readings from Last 3 Encounters:  11/19/2014 74.3 kg (163 lb 12.8 oz)  11/18/14 74.254 kg (163 lb 11.2 oz)  10/25/14 69.718 kg (153 lb 11.2 oz)    General:  Lethargic, briefly opening eyes but going back to sleep.  Eyes: PERRL, normal lids, irises & conjunctiva Cardiovascular: tachycardic, no mumers heard Respiratory:  diminished air entry at bases. No wheezing or rhonchi Abdomen: severe tenderness, generalized, distended bowel sounds none.  Musculoskeletal: grossly normal tone BUE/BLE Neurologic: lethargic, could only tell her  Name. Detailed exam couldnot be done.           Labs on Admission:  Basic Metabolic Panel:  Recent Labs Lab 11/19/2014 1805  NA 133*  K 2.2*  CL 97  CO2 25  GLUCOSE 159*  BUN 24*  CREATININE 0.67  CALCIUM 8.2*   Liver Function Tests:  Recent Labs Lab 12/01/2014 1805  AST 19  ALT 22  ALKPHOS 128*  BILITOT 0.3  PROT 6.2  ALBUMIN 2.3*    Recent Labs Lab 11/30/2014 1805  LIPASE 15   No results for input(s): AMMONIA in the last 168 hours. CBC:  Recent Labs Lab 11/18/2014 1805  WBC 16.4*  NEUTROABS 13.5*  HGB 9.6*  HCT 29.7*  MCV 93.7  PLT 282   Cardiac Enzymes: No results for input(s): CKTOTAL, CKMB, CKMBINDEX, TROPONINI in the last 168 hours.  BNP (last 3 results) No results for input(s): BNP in the last 8760 hours.  ProBNP (last 3 results)  Recent Labs  06/01/14 1614  PROBNP 68.9    CBG: No results for input(s): GLUCAP in the last 168 hours.  Radiological Exams on Admission: US Abdomen Complete  12/09/2014   CLINICAL DATA:  59 year old female with a history of abdominal pain.  Given history of lung cancer with current chemotherapy treatment.  EXAM: ULTRASOUND ABDOMEN COMPLETE  COMPARISON:  None.  FINDINGS: Gallbladder: Small amount of echogenic debris within the gallbladder lumen without reflections. Negative sonographic Murphy's sign. Gallbladder wall measures 4 mm.  Common bile duct: Diameter: 5.2 mm  Liver: Coarsened echotexture of the liver without nodularity. No focal lesion identified.  IVC: No abnormality visualized.  Pancreas: Visualized portion unremarkable.  Spleen: Size and appearance within normal limits.  Right Kidney: Length: 11.6 cm. Echogenicity similar to that of the adjacent liver. No hydronephrosis.  Left Kidney: Length: 12.0  cm. Echogenicity similar to that of the adjacent spleen. Anechoic lesion with posterior acoustic enhancement measures 2.1 cm x 2.5 cm x 2.0 cm compatible with Bosniak 1 cyst.  Abdominal aorta: No aneurysm visualized.  Other findings: None.  IMPRESSION: Sonographic survey is equivocal for acute calculus cholecystitis, as there is evidence of minimal gallbladder wall thickening and debris within the gallbladder, though the sonographic Murphy sign is negative. If there is concern for further evaluation, a nuclear medicine HIDA study may be considered.  Signed,  Dulcy Fanny. Earleen Newport, DO  [facility]  [facility]   Electronically Signed   By: Corrie Mckusick D.O.   On: 12/01/2014 19:11   Ct Abdomen Pelvis W Contrast  11/13/2014   CLINICAL DATA:  Acute onset of generalized abdominal pain for 3 days. Diarrhea. Initial  encounter.  EXAM: CT ABDOMEN AND PELVIS WITH CONTRAST  TECHNIQUE: Multidetector CT imaging of the abdomen and pelvis was performed using the standard protocol following bolus administration of intravenous contrast.  CONTRAST:  70m OMNIPAQUE IOHEXOL 300 MG/ML SOLN, 1022mOMNIPAQUE IOHEXOL 300 MG/ML SOLN  COMPARISON:  CT of the chest, abdomen and pelvis performed 10/01/2014, and abdominal ultrasound performed earlier today at 6:27 p.m.  FINDINGS: Mild bibasilar atelectasis is noted.  There is an unusual acute colonic process with multiple wall abscesses about the ascending, transverse, proximal descending and sigmoid colon. There is only mild inflammation about the colon, and this is of uncertain significance. It could reflect an unusual acute necrotic colitis, or may reflect underlying masses, not well seen, though there may have been masses along the hepatic flexure of the colon on the prior CT.  The largest fluid and air containing abscess is noted along the hepatic flexure of the colon and proximal transverse colon, measuring approximately 6.6 x 4.3 cm, and  appears contiguous with a collection of fluid and air adjacent to the gallbladder, less well defined in appearance. Additional diffuse free air is noted tracking about the medial aspect of the liver and superior to the right kidney, with air tracking about the diaphragm. The bubbly appearance of the air raises concern for an underlying necrotizing infection.  A 6.0 cm abscess is noted at the splenic flexure of the colon. There appear to be two small wall abscesses along the sigmoid colon, measuring 2.5 cm and 1.7 cm in size.  The liver and spleen are unremarkable in appearance. The gallbladder is not well assessed due to surrounding free fluid and air, but appears grossly normal in size. The pancreas and adrenal glands are unremarkable.  A 2.5 cm cyst is noted at the anterior aspect of the left kidney. A 1.6 cm mildly hypoattenuating focus is noted on delayed images within the posterior aspect of the right kidney. This is not definitely seen on the prior study, and may reflect mild focal pyelonephritis. The kidneys are otherwise unremarkable. There is no evidence of hydronephrosis. No renal or ureteral stones are seen. No perinephric stranding is appreciated.  The small bowel is unremarkable in appearance. The stomach is within normal limits. No acute vascular abnormalities are seen. Scattered calcification is seen along the abdominal aorta and its branches.  The appendix is not definitely seen; there is no evidence of appendicitis. Scattered diverticulosis is noted along the entirety of the colon, without evidence of diverticulitis.  The bladder is mildly distended and grossly unremarkable. The uterus is unremarkable in appearance. A 3.4 cm right adnexal cyst is likely physiologic in nature. No inguinal lymphadenopathy is seen.  No acute osseous abnormalities are identified. Mild facet disease noted at the lower lumbar spine.  IMPRESSION: 1. Unusual acute colonic process with multiple wall abscesses along the  ascending, transverse, proximal descending and sigmoid colon. There is only mild inflammation about the remainder of the colon, and is of uncertain significance. It could reflect an acute necrotic colitis, or possibly underlying masses, not well seen, though there may have been masses along the hepatic flexure of the colon on the prior CT. 2. Bowel perforation with diffuse free air tracking about the medial aspect of the liver and superior to the right kidney, extending under the diaphragm. The bubbly appearance of the air raises concern for underlying necrotizing infection. The location of bowel perforation is suspected to be the hepatic flexure of the colon, as fluid and air about the  gallbladder appears to extend from an abscess at the hepatic flexure. 3. Largest abscess, at the hepatic flexure of the colon and proximal transverse colon, measures 6.6 x 4.3 cm. 6.0 cm abscess noted at the splenic flexure of colon. Apparent two small wall abscess noted along the sigmoid colon, measuring 2.5 cm and 1.7 cm in size. 4. 1.6 cm mildly hypoattenuating focus on delayed images at the posterior aspect of the right kidney. This could reflect mild focal pyelonephritis, or possibly a poorly characterized isodense cyst. Follow-up renal ultrasound could be considered on an elective nonemergent basis. 2.5 cm left renal cyst noted. 5. 3.4 cm right adnexal cyst is likely physiologic, though pelvic ultrasound could be considered for further evaluation on an elective nonemergent basis. 6. Scattered calcification along the abdominal aorta and its branches. 7. Scattered diverticulosis along the entirety of the colon, without evidence of diverticulitis.  Critical Value/emergent results were called by telephone at the time of interpretation on 11/19/2014 at 10:24 pm to Dr. Lacretia Leigh, who verbally acknowledged these results.   Electronically Signed   By: Garald Balding M.D.   On: 11/14/2014 22:31    EKG: reviewed , sinus  tachycardia.   Assessment/Plan Active Problems:   Bowel perforation   Sepsis from bowel perforation and multiple abscesses: Admit to step down. Start fluid boluses NS  And IVF to keep MAP>65.  IV antibiotics, NPO.  Surgery consulted. In view of her metastatic lung ca with brain mets, discussed the code status, the son at bedside, wanted her to be full code.  He wanted her to get the surgery and all treatment possible to prolong her life, discussed with him that she might not survive the surgery.  He wanted to talk to the surgeon.  Awaiting surgery recommendations.    Severe Hypokalemia: Replace as needed. Repeat another level in am. Please check magnesium .    Hyponatremia: mild. Probably from dehydration.    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  Completed 6 cycles of chemo, plan for MRI brain this week.  Will notify Dr Julien Nordmann of her admission.    Anemia: Baseline hemoglobin around 10.  Transfuse as needed tok eep it greater than 7.          Code Status: full code, confirmed tonight.  DVT Prophylaxis: Family Communication: discussed extensively with son at bedside Disposition Plan: admit to step down.   Time spent: 75 minutes.   [facility] Pager [phone]

## 2014-12-06 NOTE — Progress Notes (Signed)
Mariah Park   DOB:07/07/1956   TX#:[id number]   [id number]  Patient Care Team: No Pcp Per Patient as PCP - General (General Practice)  Subjective: Mariah Park is a 59 yo woman with a history of extensive small cell lung cancer admitted on 12/04/2014 with persistent abdominal pain, nausea and vomiting. She is lethargic, and history is provided by her son. She was febrile, with temperature of 101.8, and somewhat confused. Sepsis was suspected requiring IV antibiotics.. He also had some metabolic abnormalities, including hypokalemia, which is being corrected. CT of the abdomen and pelvis  Showed multiple wall abscesses along the ascending, transverse, proximal descending and sigmoid colon. Radiologist states that this is worrisome for underlying masses. In addition, there is evidence of bowel perforation, with diffuse free air tracking and medial aspect of the liver, and superior to the right kidney.  She was evaluated by Gen. Surgery, concluding she Is not a surgical candidate at this time due to the extent of surgery and comorbidities. She was admitted to stepdown for supportive measures including  IV fluid boluses and antibiotics, antiemetics.She continues to be lethargic,  her pain is better controlled with current regimen. Prognosis at this time is guarded  DIAGNOSIS: Extensive stage small cell lung cancer diagnosed in November 2015 presented with large right hilar mass with direct mediastinal invasion in addition to mediastinal lymphadenopathy and metastatic lesion in the subcutaneous fat of the left flank.  PRIOR THERAPY: None  CURRENT THERAPY: Systemic chemotherapy with carboplatin for AUC of 5 on day 1 and etoposide 120 MG/M2 on days 1, 2 and 3 with Neulasta support on day 4. First cycle on 07/05/2014. Status post 6 cycles.  Scheduled Meds: . sodium chloride   Intravenous Once  . dexamethasone  4 mg Intravenous Q12H  . piperacillin-tazobactam (ZOSYN)  IV  3.375 g Intravenous Q8H  .  potassium chloride  10 mEq Intravenous Q1 Hr x 4  . vancomycin  1,000 mg Intravenous Q12H   Continuous Infusions: . sodium chloride 125 mL/hr at 12/06/14 0249   PRN Meds:acetaminophen, HYDROmorphone, ipratropium-albuterol   Objective:  Filed Vitals:   12/06/14 0800  BP:   Pulse:   Temp: 100 F (37.8 C)  Resp:       Intake/Output Summary (Last 24 hours) at 12/06/14 0858 Last data filed at 12/06/14 0600  Gross per 24 hour  Intake 3097.92 ml  Output   1050 ml  Net 2047.92 ml    ECOG PERFORMANCE STATUS:4  GENERAL: Patient is asleep, sedated SKIN: skin color, texture, turgor are normal, no rashes or significant lesions EYES: normal, conjunctiva are pink and non-injected, sclera clear OROPHARYNX:no exudate, no erythema and lips, buccal mucosa, and tongue normal  NECK: supple, thyroid normal size, non-tender, without nodularity LYMPH:  no palpable lymphadenopathy in the cervical, axillary or inguinal LUNGS: remarkable for bibasilar rales, No wheezing or rhonchi. HEART: regular rate & rhythm and no murmurs and no lower extremity edema ABDOMEN: soft, Diffuse abdominal tenderness on deep palpation, without guarding, and decreased bowel sounds Musculoskeletal:no cyanosis of digits and no clubbing  PSYCH: alert & oriented x 3 with fluent speech NEURO: no focal motor/sensory deficits    CBG (last 3)  No results for input(s): GLUCAP in the last 72 hours.   Labs:   Recent Labs Lab 11/27/2014 1805 12/06/14 0120  WBC 16.4* 11.5*  HGB 9.6* 9.1*  HCT 29.7* 28.5*  PLT 282 299  MCV 93.7 94.7  MCH 30.3 30.2  MCHC 32.3 31.9  RDW 19.2* 19.4*  LYMPHSABS 1.4  --   MONOABS 1.5*  --   EOSABS 0.0  --   BASOSABS 0.0  --      Chemistries:    Recent Labs Lab 12/04/2014 1805 12/06/14 0120 12/06/14 0424  NA 133* 136  --   K 2.2* 2.4* 2.4*  CL 97 103  --   CO2 25 23  --   GLUCOSE 159* 120*  --   BUN 24* 17  --   CREATININE 0.67 0.56  --   CALCIUM 8.2* 7.7*  --   MG  --   1.3*  --   AST 19  --   --   ALT 22  --   --   ALKPHOS 128*  --   --   BILITOT 0.3  --   --     GFR Estimated Creatinine Clearance: 77.5 mL/min (by C-G formula based on Cr of 0.56).  Liver Function Tests:  Recent Labs Lab 12/03/2014 1805  AST 19  ALT 22  ALKPHOS 128*  BILITOT 0.3  PROT 6.2  ALBUMIN 2.3*    Recent Labs Lab 11/17/2014 1805  LIPASE 15   No results for input(s): AMMONIA in the last 168 hours.  Urine Studies     Component Value Date/Time   COLORURINE YELLOW 12/06/2014 0610   APPEARANCEUR CLEAR 12/06/2014 0610   LABSPEC 1.028 12/06/2014 0610   PHURINE 5.5 12/06/2014 0610   GLUCOSEU NEGATIVE 12/06/2014 0610   HGBUR NEGATIVE 12/06/2014 0610   BILIRUBINUR NEGATIVE 12/06/2014 0610   KETONESUR NEGATIVE 12/06/2014 0610   PROTEINUR NEGATIVE 12/06/2014 0610   UROBILINOGEN 0.2 12/06/2014 0610   NITRITE NEGATIVE 12/06/2014 0610   LEUKOCYTESUR NEGATIVE 12/06/2014 0610    Coagulation profile  Recent Labs Lab 12/06/14 0120  INR 1.25    Recent Results (from the past 240 hour(s))  MRSA PCR Screening     Status: None   Collection Time: 11/23/2014 11:52 PM  Result Value Ref Range Status   MRSA by PCR NEGATIVE NEGATIVE Final    Comment:        The GeneXpert MRSA Assay (FDA approved for NASAL specimens only), is one component of a comprehensive MRSA colonization surveillance program. It is not intended to diagnose MRSA infection nor to guide or monitor treatment for MRSA infections.        Imaging Studies:  US Abdomen Complete  11/20/2014   CLINICAL DATA:  59 year old female with a history of abdominal pain.  Given history of lung cancer with current chemotherapy treatment.  EXAM: ULTRASOUND ABDOMEN COMPLETE  COMPARISON:  None.  FINDINGS: Gallbladder: Small amount of echogenic debris within the gallbladder lumen without reflections. Negative sonographic Murphy's sign. Gallbladder wall measures 4 mm.  Common bile duct: Diameter: 5.2 mm  Liver: Coarsened  echotexture of the liver without nodularity. No focal lesion identified.  IVC: No abnormality visualized.  Pancreas: Visualized portion unremarkable.  Spleen: Size and appearance within normal limits.  Right Kidney: Length: 11.6 cm. Echogenicity similar to that of the adjacent liver. No hydronephrosis.  Left Kidney: Length: 12.0 cm. Echogenicity similar to that of the adjacent spleen. Anechoic lesion with posterior acoustic enhancement measures 2.1 cm x 2.5 cm x 2.0 cm compatible with Bosniak 1 cyst.  Abdominal aorta: No aneurysm visualized.  Other findings: None.  IMPRESSION: Sonographic survey is equivocal for acute calculus cholecystitis, as there is evidence of minimal gallbladder wall thickening and debris within the gallbladder, though the sonographic Murphy sign is negative. If there is concern for further evaluation,  a nuclear medicine HIDA study may be considered.  Signed,  Dulcy Fanny. Earleen Newport, DO  [facility]  [facility]   Electronically Signed   By: Corrie Mckusick D.O.   On: 11/30/2014 19:11   Ct Abdomen Pelvis W Contrast  11/28/2014   CLINICAL DATA:  Acute onset of generalized abdominal pain for 3 days. Diarrhea. Initial encounter.  EXAM: CT ABDOMEN AND PELVIS WITH CONTRAST  TECHNIQUE: Multidetector CT imaging of the abdomen and pelvis was performed using the standard protocol following bolus administration of intravenous contrast.  CONTRAST:  48m OMNIPAQUE IOHEXOL 300 MG/ML SOLN, 1086mOMNIPAQUE IOHEXOL 300 MG/ML SOLN  COMPARISON:  CT of the chest, abdomen and pelvis performed 10/01/2014, and abdominal ultrasound performed earlier today at 6:27 p.m.  FINDINGS: Mild bibasilar atelectasis is noted.  There is an unusual acute colonic process with multiple wall abscesses about the ascending, transverse, proximal descending and sigmoid colon. There is only mild inflammation about the colon, and this is of uncertain significance. It could reflect an unusual acute  necrotic colitis, or may reflect underlying masses, not well seen, though there may have been masses along the hepatic flexure of the colon on the prior CT.  The largest fluid and air containing abscess is noted along the hepatic flexure of the colon and proximal transverse colon, measuring approximately 6.6 x 4.3 cm, and appears contiguous with a collection of fluid and air adjacent to the gallbladder, less well defined in appearance. Additional diffuse free air is noted tracking about the medial aspect of the liver and superior to the right kidney, with air tracking about the diaphragm. The bubbly appearance of the air raises concern for an underlying necrotizing infection.  A 6.0 cm abscess is noted at the splenic flexure of the colon. There appear to be two small wall abscesses along the sigmoid colon, measuring 2.5 cm and 1.7 cm in size.  The liver and spleen are unremarkable in appearance. The gallbladder is not well assessed due to surrounding free fluid and air, but appears grossly normal in size. The pancreas and adrenal glands are unremarkable.  A 2.5 cm cyst is noted at the anterior aspect of the left kidney. A 1.6 cm mildly hypoattenuating focus is noted on delayed images within the posterior aspect of the right kidney. This is not definitely seen on the prior study, and may reflect mild focal pyelonephritis. The kidneys are otherwise unremarkable. There is no evidence of hydronephrosis. No renal or ureteral stones are seen. No perinephric stranding is appreciated.  The small bowel is unremarkable in appearance. The stomach is within normal limits. No acute vascular abnormalities are seen. Scattered calcification is seen along the abdominal aorta and its branches.  The appendix is not definitely seen; there is no evidence of appendicitis. Scattered diverticulosis is noted along the entirety of the colon, without evidence of diverticulitis.  The bladder is mildly distended and grossly unremarkable. The  uterus is unremarkable in appearance. A 3.4 cm right adnexal cyst is likely physiologic in nature. No inguinal lymphadenopathy is seen.  No acute osseous abnormalities are identified. Mild facet disease noted at the lower lumbar spine.  IMPRESSION: 1. Unusual acute colonic process with multiple wall abscesses along the ascending, transverse, proximal descending and sigmoid colon. There is only mild inflammation about the remainder of the colon, and is of uncertain significance. It could reflect an acute necrotic colitis, or possibly underlying masses, not well seen, though there may have been masses along the hepatic flexure  of the colon on the prior CT. 2. Bowel perforation with diffuse free air tracking about the medial aspect of the liver and superior to the right kidney, extending under the diaphragm. The bubbly appearance of the air raises concern for underlying necrotizing infection. The location of bowel perforation is suspected to be the hepatic flexure of the colon, as fluid and air about the gallbladder appears to extend from an abscess at the hepatic flexure. 3. Largest abscess, at the hepatic flexure of the colon and proximal transverse colon, measures 6.6 x 4.3 cm. 6.0 cm abscess noted at the splenic flexure of colon. Apparent two small wall abscess noted along the sigmoid colon, measuring 2.5 cm and 1.7 cm in size. 4. 1.6 cm mildly hypoattenuating focus on delayed images at the posterior aspect of the right kidney. This could reflect mild focal pyelonephritis, or possibly a poorly characterized isodense cyst. Follow-up renal ultrasound could be considered on an elective nonemergent basis. 2.5 cm left renal cyst noted. 5. 3.4 cm right adnexal cyst is likely physiologic, though pelvic ultrasound could be considered for further evaluation on an elective nonemergent basis. 6. Scattered calcification along the abdominal aorta and its branches. 7. Scattered diverticulosis along the entirety of the colon,  without evidence of diverticulitis.  Critical Value/emergent results were called by telephone at the time of interpretation on 12/10/2014 at 10:24 pm to Dr. Lacretia Leigh, who verbally acknowledged these results.   Electronically Signed   By: Garald Balding M.D.   On: 12/10/2014 22:31    Assessment/Plan: 59 y.o.   Suspected  Bowel perforation CT of the abdomen is worrisome for metastatic involvement. The patient had radiology consult for consideration of palliative percutaneous drainage of the largest abscesses. At this time, she is not a candidate for operative treatment, as these would include subtotal colectomy, ileostomy, and open wound to heal by secondary intention in color overall status and prognosis  Extensive small cell lung cancer Presented with a right lung mass in the right hilar area with direct mediastinal invasion as well as metastatic soft tissue implant in the subcutaneous fat end of the left flank area and few metastatic brain lesions. She is currently undergoing systemic chemotherapy with carboplatin and etoposide status post 6 cycles.   Metastatic brain lesions This was being followed closely. MRI of the brain is being scheduled, and depending on the results, she will be considered for whole brain irradiation  Anemia Due to recent chemotherapy malnutrition dilution and sepsis, antibiotics  No transfusion is indicated at this time Monitor counts closely Transfuse blood to maintain a Hb of 8 g or if the patient is acutely bleeding  Leukocytosis This is due to Decadron and sepsis No intervention is indicated at this time Will continue to monitor  Full Code   Other medical issues as per admitting team     **Disclaimer: This note was dictated with voice recognition software. Similar sounding words can inadvertently be transcribed and this note may contain transcription errors which may not have been corrected upon publication of note.** Rondel Jumbo,  PA-C 12/06/2014  8:58 AM  ADDENDUM: Hematology/Oncology Attending: The patient is seen and examined. I agree with the above note. This is a very pleasant 59 years old African-American female with extensive stage small cell lung cancer status post 6 cycles of systemic chemotherapy with carboplatin and etoposide and tolerated her treatment fairly well with no significant adverse effects. The patient was supposed to have restaging CT scan of the chest, abdomen and pelvis  for evaluation of her disease but missed her appointment. She was also supposed to start whole brain irradiation for the metastatic brain lesions but also missed her appointment. She presented to the emergency Department complaining of persistent pain as well as nausea and vomiting in addition to fever and confusion. CT scan of the abdomen pelvis was performed in the emergency department that showed multiple wall abscesses along the ascending, transverse and proximal descending and sigmoid colon. The patient was evaluated by Dr. Lucia Gaskins and Dr. Zella Richer. She was not felt to be a good candidate for surgical resection. I discussed her case with Dr. Zella Richer and because of her poor performance status and short life expectancy, the decision was made to proceed with CT-guided drainage by percutaneous catheter placement by interventional radiology which was performed later today.  I also had a lengthy discussion with the patient's son who was at the bedside about her current disease status and prognosis. She is currently off systemic chemotherapy and not expected to start any systemic treatment in the next few months unless she has evidence for disease progression. The patient will still need whole brain irradiation but this will be performed after improvement in her condition. Continue current supportive care. Thank you so much for taking good care of Mariah. Beauchamp, I will continue to follow up the patient with you and assist in her management on  as-needed basis.

## 2014-12-06 NOTE — Progress Notes (Signed)
Subjective: She denies abdominal pain.  Son in room.  Objective: Vital signs in last 24 hours: Temp:  [98.2 F (36.8 C)-101.8 F (38.8 C)] 100 F (37.8 C) (04/25 0800) Pulse Rate:  [90-111] 109 (04/25 0600) Resp:  [14-37] 28 (04/25 0600) BP: (96-121)/(60-73) 111/61 mmHg (04/25 0600) SpO2:  [87 %-100 %] 99 % (04/25 0600) Weight:  [74.3 kg (163 lb 12.8 oz)] 74.3 kg (163 lb 12.8 oz) (04/24 2346)    Intake/Output from previous day: 04/24 0701 - 04/25 0700 In: 3097.9 [I.V.:2697.9; IV Piggyback:400] Out: 1050 [Urine:1050] Intake/Output this shift:    PE: General- In NAD Abdomen-soft, tender in all but the LLQ  Lab Results:   Recent Labs  11/28/2014 1805 12/06/14 0120  WBC 16.4* 11.5*  HGB 9.6* 9.1*  HCT 29.7* 28.5*  PLT 282 299   BMET  Recent Labs  11/26/2014 1805 12/06/14 0120 12/06/14 0424  NA 133* 136  --   K 2.2* 2.4* 2.4*  CL 97 103  --   CO2 25 23  --   GLUCOSE 159* 120*  --   BUN 24* 17  --   CREATININE 0.67 0.56  --   CALCIUM 8.2* 7.7*  --    PT/INR  Recent Labs  12/06/14 0120  LABPROT 15.8*  INR 1.25   Comprehensive Metabolic Panel:    Component Value Date/Time   NA 136 12/06/2014 0120   NA 133*  1805   NA 144 11/18/2014 1000   NA 144 11/01/2014 1201   K 2.4* 12/06/2014 0424   K 2.4* 12/06/2014 0120   K 4.0 11/18/2014 1000   K 3.3* 11/01/2014 1201   CL 103 12/06/2014 0120   CL 97 11/18/2014 1805   CO2 23 12/06/2014 0120   CO2 25 11/30/2014 1805   CO2 25 11/18/2014 1000   CO2 31* 11/01/2014 1201   BUN 17 12/06/2014 0120   BUN 24* 11/28/2014 1805   BUN 13.4 11/18/2014 1000   BUN 12.4 11/01/2014 1201   CREATININE 0.56 12/06/2014 0120   CREATININE 0.67 11/19/2014 1805   CREATININE 0.6 11/18/2014 1000   CREATININE 0.7 11/01/2014 1201   GLUCOSE 120* 12/06/2014 0120   GLUCOSE 159* 12/06/2014 1805   GLUCOSE 110 11/18/2014 1000   GLUCOSE 98 11/01/2014 1201   CALCIUM 7.7* 12/06/2014 0120   CALCIUM 8.2* 12/10/2014 1805    CALCIUM 9.3 11/18/2014 1000   CALCIUM 9.7 11/01/2014 1201   AST 19  1805   AST 26 11/18/2014 1000   AST 39* 11/01/2014 1201   ALT 22 11/25/2014 1805   ALT 53 11/18/2014 1000   ALT 72* 11/01/2014 1201   ALKPHOS 128* 12/02/2014 1805   ALKPHOS 106 11/18/2014 1000   ALKPHOS 91 11/01/2014 1201   BILITOT 0.3 11/16/2014 1805   BILITOT <0.20 11/18/2014 1000   BILITOT 0.21 11/01/2014 1201   PROT 6.2 12/04/2014 1805   PROT 6.3* 11/18/2014 1000   PROT 6.3* 11/01/2014 1201   ALBUMIN 2.3* 11/16/2014 1805   ALBUMIN 3.0* 11/18/2014 1000   ALBUMIN 3.2* 11/01/2014 1201     Studies/Results: US Abdomen Complete  11/26/2014   CLINICAL DATA:  59 year old female with a history of abdominal pain.  Given history of lung cancer with current chemotherapy treatment.  EXAM: ULTRASOUND ABDOMEN COMPLETE  COMPARISON:  None.  FINDINGS: Gallbladder: Small amount of echogenic debris within the gallbladder lumen without reflections. Negative sonographic Murphy's sign. Gallbladder wall measures 4 mm.  Common bile duct: Diameter: 5.2 mm  Liver: Coarsened echotexture of  the liver without nodularity. No focal lesion identified.  IVC: No abnormality visualized.  Pancreas: Visualized portion unremarkable.  Spleen: Size and appearance within normal limits.  Right Kidney: Length: 11.6 cm. Echogenicity similar to that of the adjacent liver. No hydronephrosis.  Left Kidney: Length: 12.0 cm. Echogenicity similar to that of the adjacent spleen. Anechoic lesion with posterior acoustic enhancement measures 2.1 cm x 2.5 cm x 2.0 cm compatible with Bosniak 1 cyst.  Abdominal aorta: No aneurysm visualized.  Other findings: None.  IMPRESSION: Sonographic survey is equivocal for acute calculus cholecystitis, as there is evidence of minimal gallbladder wall thickening and debris within the gallbladder, though the sonographic Murphy sign is negative. If there is concern for further evaluation, a nuclear medicine HIDA study may be  considered.  Signed,  Dulcy Fanny. Earleen Newport, DO  [facility]  [facility]   Electronically Signed   By: Corrie Mckusick D.O.   On: 11/28/2014 19:11   Ct Abdomen Pelvis W Contrast  11/25/2014   CLINICAL DATA:  Acute onset of generalized abdominal pain for 3 days. Diarrhea. Initial encounter.  EXAM: CT ABDOMEN AND PELVIS WITH CONTRAST  TECHNIQUE: Multidetector CT imaging of the abdomen and pelvis was performed using the standard protocol following bolus administration of intravenous contrast.  CONTRAST:  43m OMNIPAQUE IOHEXOL 300 MG/ML SOLN, 1031mOMNIPAQUE IOHEXOL 300 MG/ML SOLN  COMPARISON:  CT of the chest, abdomen and pelvis performed 10/01/2014, and abdominal ultrasound performed earlier today at 6:27 p.m.  FINDINGS: Mild bibasilar atelectasis is noted.  There is an unusual acute colonic process with multiple wall abscesses about the ascending, transverse, proximal descending and sigmoid colon. There is only mild inflammation about the colon, and this is of uncertain significance. It could reflect an unusual acute necrotic colitis, or may reflect underlying masses, not well seen, though there may have been masses along the hepatic flexure of the colon on the prior CT.  The largest fluid and air containing abscess is noted along the hepatic flexure of the colon and proximal transverse colon, measuring approximately 6.6 x 4.3 cm, and appears contiguous with a collection of fluid and air adjacent to the gallbladder, less well defined in appearance. Additional diffuse free air is noted tracking about the medial aspect of the liver and superior to the right kidney, with air tracking about the diaphragm. The bubbly appearance of the air raises concern for an underlying necrotizing infection.  A 6.0 cm abscess is noted at the splenic flexure of the colon. There appear to be two small wall abscesses along the sigmoid colon, measuring 2.5 cm and 1.7 cm in size.  The liver and  spleen are unremarkable in appearance. The gallbladder is not well assessed due to surrounding free fluid and air, but appears grossly normal in size. The pancreas and adrenal glands are unremarkable.  A 2.5 cm cyst is noted at the anterior aspect of the left kidney. A 1.6 cm mildly hypoattenuating focus is noted on delayed images within the posterior aspect of the right kidney. This is not definitely seen on the prior study, and may reflect mild focal pyelonephritis. The kidneys are otherwise unremarkable. There is no evidence of hydronephrosis. No renal or ureteral stones are seen. No perinephric stranding is appreciated.  The small bowel is unremarkable in appearance. The stomach is within normal limits. No acute vascular abnormalities are seen. Scattered calcification is seen along the abdominal aorta and its branches.  The appendix is not definitely seen; there is no  evidence of appendicitis. Scattered diverticulosis is noted along the entirety of the colon, without evidence of diverticulitis.  The bladder is mildly distended and grossly unremarkable. The uterus is unremarkable in appearance. A 3.4 cm right adnexal cyst is likely physiologic in nature. No inguinal lymphadenopathy is seen.  No acute osseous abnormalities are identified. Mild facet disease noted at the lower lumbar spine.  IMPRESSION: 1. Unusual acute colonic process with multiple wall abscesses along the ascending, transverse, proximal descending and sigmoid colon. There is only mild inflammation about the remainder of the colon, and is of uncertain significance. It could reflect an acute necrotic colitis, or possibly underlying masses, not well seen, though there may have been masses along the hepatic flexure of the colon on the prior CT. 2. Bowel perforation with diffuse free air tracking about the medial aspect of the liver and superior to the right kidney, extending under the diaphragm. The bubbly appearance of the air raises concern for  underlying necrotizing infection. The location of bowel perforation is suspected to be the hepatic flexure of the colon, as fluid and air about the gallbladder appears to extend from an abscess at the hepatic flexure. 3. Largest abscess, at the hepatic flexure of the colon and proximal transverse colon, measures 6.6 x 4.3 cm. 6.0 cm abscess noted at the splenic flexure of colon. Apparent two small wall abscess noted along the sigmoid colon, measuring 2.5 cm and 1.7 cm in size. 4. 1.6 cm mildly hypoattenuating focus on delayed images at the posterior aspect of the right kidney. This could reflect mild focal pyelonephritis, or possibly a poorly characterized isodense cyst. Follow-up renal ultrasound could be considered on an elective nonemergent basis. 2.5 cm left renal cyst noted. 5. 3.4 cm right adnexal cyst is likely physiologic, though pelvic ultrasound could be considered for further evaluation on an elective nonemergent basis. 6. Scattered calcification along the abdominal aorta and its branches. 7. Scattered diverticulosis along the entirety of the colon, without evidence of diverticulitis.  Critical Value/emergent results were called by telephone at the time of interpretation on 12/02/2014 at 10:24 pm to Dr. Lacretia Leigh, who verbally acknowledged these results.   Electronically Signed   By: Garald Balding M.D.   On: 12/04/2014 22:31    Anti-infectives: Anti-infectives    Start     Dose/Rate Route Frequency Ordered Stop   12/06/14 0000  vancomycin (VANCOCIN) IVPB 1000 mg/200 mL premix     1,000 mg 200 mL/hr over 60 Minutes Intravenous Every 12 hours 12/01/2014 2348     12/06/14 0000  piperacillin-tazobactam (ZOSYN) IVPB 3.375 g     3.375 g 12.5 mL/hr over 240 Minutes Intravenous Every 8 hours 11/18/2014 2348        Assessment 1.  Diffuse colonic infectious process with multiple abscess the largest of which are at hepatic flexure and splenic flexure with evidence of perforation-WBC down; on broad  spectrum abxs 2.  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  Would not recommend operative treatment (which would be subtotal colectomy, ileostomy and open wound to heal by secondary intention) in her overall situation.  This was also discussed with Dr. Julien Nordmann.   Creg Gilmer Lenna Sciara 12/06/2014

## 2014-12-06 NOTE — Progress Notes (Signed)
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 CT scan of abdomen and pelvis performed on admission showed multiple wall abscesses along the ascending, transverse, proximal descending and sigmoid colon. Radiology feeling that this could reflect underlying masses. There is also evidence of bowel perforation with diffuse free air tracking and medial aspect of the liver and superior to the right kidney. Patient was seen and evaluated by general surgery overnight to recommended awaiting input from her treating medical oncologist Dr. Julien Nordmann. She was started on empiric IV antibiotic therapy with vancomycin and Zosyn.  Assessment/Plan: 1. Colonic wall abscesses/bowel perforation.  -Patient having a history of metastatic small cell lung cancer, presenting with abdominal pain with CT imaging showing the presence of multiple wall abscesses involving colon. There was a large abscess along the hepatic flexure of the colon containing both fluid and air, highly suspicious for bowel perforation. -Radiology reporting that this could reflect necrotic colitis versus underlying masses. These findings were not present on a CT scan performed on 10/01/2014, which only revealed diverticulosis of the colon. CT report mentioned a positive response to ongoing chemotherapy with decrease in size of mediastinal adenopathy. -Overnight general surgery was consulted, options  discussed with patient and his son. Will await recommendations from her treating oncologist Dr. Julien Nordmann. Will continue supportive care, IV fluids, IV antimicrobial therapy.  2.  Metastatic lung cancer -Patient currently receiving treatment at the cancer center undergoing 6 cycles of carboplatin and etoposide. She had an MRI of the brain which showed evidence of metastatic disease. Last CT scan prior to this hospitalization was done on 10/01/2014 which actually revealed positive response to ongoing chemotherapy with decrease in size of mediastinal adenopathy as well as right hilar/mediastinal mass. -Await further recommendations from Dr. Julien Nordmann regarding above mentioned findings.  3.  Sepsis -Present on admission, evidenced by heart rate of 106 with temperature 101.8, encephalopathy, secondary to acute intra-abdominal abscess -Follow-up on blood cultures, continue supportive care with IV fluids and antibiotic therapy.  4.  Hypokalemia. -A.m. lab work showing potassium of 2.4, currently being replaced with IV potassium.  5.  Toxic encephalopathy. -Patient presenting with steep functional decline/failure to thrive, likely secondary to combination of advanced lung cancer along with intra-abdominal infectious processes/bowel perforation/peritonitis -Continue broad-spectrum IV antimicrobial therapy  5. Goals of Care -I spoke with her son who was present at bedside regarding goals, he stated he didn't want his mother to suffer however wanted "to take my mother home." He wishes for full scope of treatment, including CPR.   Code Status: Full Code Family Communication: I spoke with her son who was present at bedside Disposition Plan: Continue supportive care, close monitoring in the step down unit, awaiting further recommendations from Dr. Julien Nordmann.   Consultants:  General surgery  Oncology   Antibiotics:  IV vancomycin  IV Zosyn  HPI/Subjective: Patient is somnolent however arousable  and did answer a few questions for me. She reports feeling better from pain management standpoint. She received IV narcotic analgesics overnight.  Objective: Filed Vitals:   12/06/14 0600  BP: 111/61  Pulse: 109  Temp:   Resp: 28    Intake/Output Summary (Last 24 hours) at 12/06/14 0801 Last data filed at 12/06/14 0600  Gross per 24 hour  Intake 3097.92 ml  Output   1050 ml  Net 2047.92 ml   Filed Weights   11/17/2014 2346  Weight: 74.3 kg (163 lb 12.8 oz)    Exam:   General:  Patient is somewhat sedated however arousable, did answer a few my questions appropriately.  Cardiovascular: Tachycardic, Regular rate and rhythm normal S1-S2 no murmurs rubs or gallops  Respiratory: Coarse respiratory sounds, having a few by basilar crackles, normal respiratory effort  Abdomen: Patient having increased pain to palpation over her right lower quadrant, with presence of generalized pain, there is no guarding, did not appreciate rebound tenderness.  Musculoskeletal: No edema   Data Reviewed: Basic Metabolic Panel:  Recent Labs Lab 11/25/2014 1805 12/06/14 0120 12/06/14 0424  NA 133* 136  --   K 2.2* 2.4* 2.4*  CL 97 103  --   CO2 25 23  --   GLUCOSE 159* 120*  --   BUN 24* 17  --   CREATININE 0.67 0.56  --   CALCIUM 8.2* 7.7*  --   MG  --  1.3*  --    Liver Function Tests:  Recent Labs Lab 11/29/2014 1805  AST 19  ALT 22  ALKPHOS 128*  BILITOT 0.3  PROT 6.2  ALBUMIN 2.3*    Recent Labs Lab 11/17/2014 1805  LIPASE 15   No results for input(s): AMMONIA in the last 168 hours. CBC:  Recent Labs Lab 12/01/2014 1805 12/06/14 0120  WBC 16.4* 11.5*  NEUTROABS 13.5*  --   HGB 9.6* 9.1*  HCT 29.7* 28.5*  MCV 93.7 94.7  PLT 282 299   Cardiac Enzymes: No results for input(s): CKTOTAL, CKMB, CKMBINDEX, TROPONINI in the last 168 hours. BNP (last 3 results) No results for input(s): BNP in the last 8760 hours.  ProBNP (last 3 results)  Recent Labs  06/01/14 1614   PROBNP 68.9    CBG: No results for input(s): GLUCAP in the last 168 hours.  Recent Results (from the past 240 hour(s))  MRSA PCR Screening     Status: None   Collection Time: 11/13/2014 11:52 PM  Result Value Ref Range Status   MRSA by PCR NEGATIVE NEGATIVE Final    Comment:        The GeneXpert MRSA Assay (FDA approved for NASAL specimens only), is one component of a comprehensive MRSA colonization surveillance program. It is not intended to diagnose MRSA infection nor to guide or monitor treatment for MRSA infections.      Studies: US Abdomen Complete  12/04/2014   CLINICAL DATA:  59 year old female with a history of abdominal pain.  Given history of lung cancer with current chemotherapy treatment.  EXAM: ULTRASOUND ABDOMEN COMPLETE  COMPARISON:  None.  FINDINGS: Gallbladder: Small amount of echogenic debris within the gallbladder lumen without reflections. Negative sonographic Murphy's sign. Gallbladder wall measures 4 mm.  Common bile duct: Diameter: 5.2 mm  Liver: Coarsened echotexture of the liver without nodularity. No focal lesion identified.  IVC: No abnormality visualized.  Pancreas: Visualized portion unremarkable.  Spleen: Size and appearance within normal limits.  Right Kidney: Length: 11.6 cm. Echogenicity similar to that of the adjacent liver. No hydronephrosis.  Left Kidney: Length: 12.0 cm. Echogenicity similar to that of the adjacent spleen. Anechoic  lesion with posterior acoustic enhancement measures 2.1 cm x 2.5 cm x 2.0 cm compatible with Bosniak 1 cyst.  Abdominal aorta: No aneurysm visualized.  Other findings: None.  IMPRESSION: Sonographic survey is equivocal for acute calculus cholecystitis, as there is evidence of minimal gallbladder wall thickening and debris within the gallbladder, though the sonographic Murphy sign is negative. If there is concern for further evaluation, a nuclear medicine HIDA study may be considered.  Signed,  Dulcy Fanny. Earleen Newport, DO  [facility]  [facility]   Electronically Signed   By: Corrie Mckusick D.O.   On: 11/20/2014 19:11   Ct Abdomen Pelvis W Contrast  11/29/2014   CLINICAL DATA:  Acute onset of generalized abdominal pain for 3 days. Diarrhea. Initial encounter.  EXAM: CT ABDOMEN AND PELVIS WITH CONTRAST  TECHNIQUE: Multidetector CT imaging of the abdomen and pelvis was performed using the standard protocol following bolus administration of intravenous contrast.  CONTRAST:  12m OMNIPAQUE IOHEXOL 300 MG/ML SOLN, 1018mOMNIPAQUE IOHEXOL 300 MG/ML SOLN  COMPARISON:  CT of the chest, abdomen and pelvis performed 10/01/2014, and abdominal ultrasound performed earlier today at 6:27 p.m.  FINDINGS: Mild bibasilar atelectasis is noted.  There is an unusual acute colonic process with multiple wall abscesses about the ascending, transverse, proximal descending and sigmoid colon. There is only mild inflammation about the colon, and this is of uncertain significance. It could reflect an unusual acute necrotic colitis, or may reflect underlying masses, not well seen, though there may have been masses along the hepatic flexure of the colon on the prior CT.  The largest fluid and air containing abscess is noted along the hepatic flexure of the colon and proximal transverse colon, measuring approximately 6.6 x 4.3 cm, and appears contiguous with a collection of fluid and air adjacent to the gallbladder, less well defined in appearance. Additional diffuse free air is noted tracking about the medial aspect of the liver and superior to the right kidney, with air tracking about the diaphragm. The bubbly appearance of the air raises concern for an underlying necrotizing infection.  A 6.0 cm abscess is noted at the splenic flexure of the colon. There appear to be two small wall abscesses along the sigmoid colon, measuring 2.5 cm and 1.7 cm in size.  The liver and spleen are unremarkable in appearance. The gallbladder is  not well assessed due to surrounding free fluid and air, but appears grossly normal in size. The pancreas and adrenal glands are unremarkable.  A 2.5 cm cyst is noted at the anterior aspect of the left kidney. A 1.6 cm mildly hypoattenuating focus is noted on delayed images within the posterior aspect of the right kidney. This is not definitely seen on the prior study, and may reflect mild focal pyelonephritis. The kidneys are otherwise unremarkable. There is no evidence of hydronephrosis. No renal or ureteral stones are seen. No perinephric stranding is appreciated.  The small bowel is unremarkable in appearance. The stomach is within normal limits. No acute vascular abnormalities are seen. Scattered calcification is seen along the abdominal aorta and its branches.  The appendix is not definitely seen; there is no evidence of appendicitis. Scattered diverticulosis is noted along the entirety of the colon, without evidence of diverticulitis.  The bladder is mildly distended and grossly unremarkable. The uterus is unremarkable in appearance. A 3.4 cm right adnexal cyst is likely physiologic in nature. No inguinal lymphadenopathy is seen.  No acute osseous abnormalities are identified. Mild facet  disease noted at the lower lumbar spine.  IMPRESSION: 1. Unusual acute colonic process with multiple wall abscesses along the ascending, transverse, proximal descending and sigmoid colon. There is only mild inflammation about the remainder of the colon, and is of uncertain significance. It could reflect an acute necrotic colitis, or possibly underlying masses, not well seen, though there may have been masses along the hepatic flexure of the colon on the prior CT. 2. Bowel perforation with diffuse free air tracking about the medial aspect of the liver and superior to the right kidney, extending under the diaphragm. The bubbly appearance of the air raises concern for underlying necrotizing infection. The location of bowel  perforation is suspected to be the hepatic flexure of the colon, as fluid and air about the gallbladder appears to extend from an abscess at the hepatic flexure. 3. Largest abscess, at the hepatic flexure of the colon and proximal transverse colon, measures 6.6 x 4.3 cm. 6.0 cm abscess noted at the splenic flexure of colon. Apparent two small wall abscess noted along the sigmoid colon, measuring 2.5 cm and 1.7 cm in size. 4. 1.6 cm mildly hypoattenuating focus on delayed images at the posterior aspect of the right kidney. This could reflect mild focal pyelonephritis, or possibly a poorly characterized isodense cyst. Follow-up renal ultrasound could be considered on an elective nonemergent basis. 2.5 cm left renal cyst noted. 5. 3.4 cm right adnexal cyst is likely physiologic, though pelvic ultrasound could be considered for further evaluation on an elective nonemergent basis. 6. Scattered calcification along the abdominal aorta and its branches. 7. Scattered diverticulosis along the entirety of the colon, without evidence of diverticulitis.  Critical Value/emergent results were called by telephone at the time of interpretation on 11/21/2014 at 10:24 pm to Dr. Lacretia Leigh, who verbally acknowledged these results.   Electronically Signed   By: Garald Balding M.D.   On: 11/20/2014 22:31    Scheduled Meds: . sodium chloride   Intravenous Once  . dexamethasone  4 mg Intravenous Q12H  . piperacillin-tazobactam (ZOSYN)  IV  3.375 g Intravenous Q8H  . potassium chloride  10 mEq Intravenous Q1 Hr x 4  . vancomycin  1,000 mg Intravenous Q12H   Continuous Infusions: . sodium chloride 125 mL/hr at 12/06/14 0249    Active Problems:   Bowel perforation   Hypokalemia   Hyponatremia   Anemia    Time spent:     Kelvin Cellar  [facility] Pager [phone]. If 7PM-7AM, please contact night-coverage at [email or web address], password [facility] 12/06/2014, 8:01 AM  LOS: 1 day

## 2014-12-06 NOTE — Consult Note (Signed)
Reason for consult: Abdominal abscesses, need for percutaneous drainage  Referring Physician(s): [facility]  History of Present Illness: Mariah Park is a 59 y.o. female with history of stage IV small cell lung carcinoma, anemia, malnutrition, coronary artery disease who recently presented to the hospital with abdominal pain and subsequent imaging revealing multiple abdominal wall abscesses along the ascending, transverse, proximal descending and sigmoid colon, bowel perforation with diffuse free air tracking about the medial aspect of the liver and superior to the right kidney suspicious for underlying necrotizing infection. The largest abscess is noted along the hepatic flexure and proximal transverse colon as well as the splenic flexure . Patient has no prior history of abdominal surgery .  [facility] has evaluated the patient and they feel that operative treatment would be too extensive with high morbidity/mortality in light of patient's current clinical status. Request has now been received for palliative drainage of the 2 largest abdominal abscesses.   Past Medical History  Diagnosis Date  . Brain metastases 06/21/2014  . Lung cancer     History reviewed. No pertinent past surgical history.  Allergies: Review of patient's allergies indicates no known allergies.  Medications: Prior to Admission medications   Medication Sig Start Date End Date Taking? Authorizing Provider  aspirin 81 MG tablet Take 81 mg by mouth daily as needed for pain (chest pain).   Yes Historical Provider, MD  calcium-vitamin D (OSCAL WITH D) 500-200 MG-UNIT per tablet Take 1 tablet by mouth 2 (two) times daily. 10/25/14  Yes Maryanna Shape, NP  dexamethasone (DECADRON) 4 MG tablet Take 1 tablet (4 mg total) by mouth 2 (two) times daily with a meal. 11/01/14  Yes Curt Bears, MD  dextromethorphan (DELSYM) 30 MG/5ML liquid Take 5 mLs (30 mg total) by mouth every 12 (twelve) hours as needed for cough. 11/18/14  Yes  Adrena E Johnson, PA-C  gabapentin (NEURONTIN) 800 MG tablet Take 800 mg by mouth daily as needed (pain).   Yes Historical Provider, MD  ibuprofen (ADVIL,MOTRIN) 200 MG tablet Take 400 mg by mouth every 6 (six) hours as needed for moderate pain (pain).   Yes Historical Provider, MD  ipratropium-albuterol (DUONEB) 0.5-2.5 (3) MG/3ML SOLN Take 3 mLs by nebulization every 6 (six) hours as needed. 08/02/14  Yes Curt Bears, MD  Multiple Vitamins-Minerals (MULTIVITAMIN WITH MINERALS) tablet Take 1 tablet by mouth daily.   Yes Historical Provider, MD  oxyCODONE-acetaminophen (PERCOCET) 7.5-325 MG per tablet Take 1 tablet by mouth every 6 (six) hours as needed for pain. 11/18/14  Yes Adrena E Johnson, PA-C  potassium chloride SA (K-DUR,KLOR-CON) 20 MEQ tablet TAKE 1 TABLET BY MOUTH EVERY DAY 11/13/14  Yes Curt Bears, MD     Family History  Problem Relation Age of Onset  . Stroke Mother   . Cancer Sister     History   Social History  . Marital Status: Single    Spouse Name: N/A  . Number of Children: N/A  . Years of Education: N/A   Social History Main Topics  . Smoking status: Former Smoker -- 0.50 packs/day    Types: Cigarettes    Quit date: 06/02/2014  . Smokeless tobacco: Not on file  . Alcohol Use: Yes  . Drug Use: 14.00 per week    Special: Marijuana  . Sexual Activity: Not Currently   Other Topics Concern  . None   Social History Narrative      Review of Systemssee above; patient currently denies chest pain, dyspnea,  nausea or vomiting. She continues to have diffuse abdominal pain .   Vital Signs: BP 93/64 mmHg  Pulse 98  Temp(Src) 100 F (37.8 C) (Oral)  Resp 13  Wt 163 lb 12.8 oz (74.3 kg)  SpO2 100%  Physical Exampatient awake, alert. Chest with few bibasilar rales, heart tachycardic but regular; abdomen soft, few bowel sounds, generalized abdominal tenderness, more so in the epigastric and lower quadrant regions; extremities with  full range of motion, no  significant edema.       Mallampati Score:     Imaging: US Abdomen Complete  12/03/2014   CLINICAL DATA:  59 year old female with a history of abdominal pain.  Given history of lung cancer with current chemotherapy treatment.  EXAM: ULTRASOUND ABDOMEN COMPLETE  COMPARISON:  None.  FINDINGS: Gallbladder: Small amount of echogenic debris within the gallbladder lumen without reflections. Negative sonographic Murphy's sign. Gallbladder wall measures 4 mm.  Common bile duct: Diameter: 5.2 mm  Liver: Coarsened echotexture of the liver without nodularity. No focal lesion identified.  IVC: No abnormality visualized.  Pancreas: Visualized portion unremarkable.  Spleen: Size and appearance within normal limits.  Right Kidney: Length: 11.6 cm. Echogenicity similar to that of the adjacent liver. No hydronephrosis.  Left Kidney: Length: 12.0 cm. Echogenicity similar to that of the adjacent spleen. Anechoic lesion with posterior acoustic enhancement measures 2.1 cm x 2.5 cm x 2.0 cm compatible with Bosniak 1 cyst.  Abdominal aorta: No aneurysm visualized.  Other findings: None.  IMPRESSION: Sonographic survey is equivocal for acute calculus cholecystitis, as there is evidence of minimal gallbladder wall thickening and debris within the gallbladder, though the sonographic Murphy sign is negative. If there is concern for further evaluation, a nuclear medicine HIDA study may be considered.  Signed,  Dulcy Fanny. Earleen Newport, DO  [facility]  [facility]   Electronically Signed   By: Corrie Mckusick D.O.   On: 11/28/2014 19:11   Ct Abdomen Pelvis W Contrast  11/21/2014   CLINICAL DATA:  Acute onset of generalized abdominal pain for 3 days. Diarrhea. Initial encounter.  EXAM: CT ABDOMEN AND PELVIS WITH CONTRAST  TECHNIQUE: Multidetector CT imaging of the abdomen and pelvis was performed using the standard protocol following bolus administration of intravenous contrast.  CONTRAST:  61m  OMNIPAQUE IOHEXOL 300 MG/ML SOLN, 1042mOMNIPAQUE IOHEXOL 300 MG/ML SOLN  COMPARISON:  CT of the chest, abdomen and pelvis performed 10/01/2014, and abdominal ultrasound performed earlier today at 6:27 p.m.  FINDINGS: Mild bibasilar atelectasis is noted.  There is an unusual acute colonic process with multiple wall abscesses about the ascending, transverse, proximal descending and sigmoid colon. There is only mild inflammation about the colon, and this is of uncertain significance. It could reflect an unusual acute necrotic colitis, or may reflect underlying masses, not well seen, though there may have been masses along the hepatic flexure of the colon on the prior CT.  The largest fluid and air containing abscess is noted along the hepatic flexure of the colon and proximal transverse colon, measuring approximately 6.6 x 4.3 cm, and appears contiguous with a collection of fluid and air adjacent to the gallbladder, less well defined in appearance. Additional diffuse free air is noted tracking about the medial aspect of the liver and superior to the right kidney, with air tracking about the diaphragm. The bubbly appearance of the air raises concern for an underlying necrotizing infection.  A 6.0 cm abscess is noted at the splenic flexure of the colon.  There appear to be two small wall abscesses along the sigmoid colon, measuring 2.5 cm and 1.7 cm in size.  The liver and spleen are unremarkable in appearance. The gallbladder is not well assessed due to surrounding free fluid and air, but appears grossly normal in size. The pancreas and adrenal glands are unremarkable.  A 2.5 cm cyst is noted at the anterior aspect of the left kidney. A 1.6 cm mildly hypoattenuating focus is noted on delayed images within the posterior aspect of the right kidney. This is not definitely seen on the prior study, and may reflect mild focal pyelonephritis. The kidneys are otherwise unremarkable. There is no evidence of hydronephrosis. No  renal or ureteral stones are seen. No perinephric stranding is appreciated.  The small bowel is unremarkable in appearance. The stomach is within normal limits. No acute vascular abnormalities are seen. Scattered calcification is seen along the abdominal aorta and its branches.  The appendix is not definitely seen; there is no evidence of appendicitis. Scattered diverticulosis is noted along the entirety of the colon, without evidence of diverticulitis.  The bladder is mildly distended and grossly unremarkable. The uterus is unremarkable in appearance. A 3.4 cm right adnexal cyst is likely physiologic in nature. No inguinal lymphadenopathy is seen.  No acute osseous abnormalities are identified. Mild facet disease noted at the lower lumbar spine.  IMPRESSION: 1. Unusual acute colonic process with multiple wall abscesses along the ascending, transverse, proximal descending and sigmoid colon. There is only mild inflammation about the remainder of the colon, and is of uncertain significance. It could reflect an acute necrotic colitis, or possibly underlying masses, not well seen, though there may have been masses along the hepatic flexure of the colon on the prior CT. 2. Bowel perforation with diffuse free air tracking about the medial aspect of the liver and superior to the right kidney, extending under the diaphragm. The bubbly appearance of the air raises concern for underlying necrotizing infection. The location of bowel perforation is suspected to be the hepatic flexure of the colon, as fluid and air about the gallbladder appears to extend from an abscess at the hepatic flexure. 3. Largest abscess, at the hepatic flexure of the colon and proximal transverse colon, measures 6.6 x 4.3 cm. 6.0 cm abscess noted at the splenic flexure of colon. Apparent two small wall abscess noted along the sigmoid colon, measuring 2.5 cm and 1.7 cm in size. 4. 1.6 cm mildly hypoattenuating focus on delayed images at the posterior  aspect of the right kidney. This could reflect mild focal pyelonephritis, or possibly a poorly characterized isodense cyst. Follow-up renal ultrasound could be considered on an elective nonemergent basis. 2.5 cm left renal cyst noted. 5. 3.4 cm right adnexal cyst is likely physiologic, though pelvic ultrasound could be considered for further evaluation on an elective nonemergent basis. 6. Scattered calcification along the abdominal aorta and its branches. 7. Scattered diverticulosis along the entirety of the colon, without evidence of diverticulitis.  Critical Value/emergent results were called by telephone at the time of interpretation on 11/12/2014 at 10:24 pm to Dr. Lacretia Leigh, who verbally acknowledged these results.   Electronically Signed   By: Garald Balding M.D.   On: 11/23/2014 22:31    Labs:  CBC:  Recent Labs  11/01/14 1201 11/18/14 0959 12/11/2014 1805 12/06/14 0120  WBC 9.9 9.4 16.4* 11.5*  HGB 10.3* 10.5* 9.6* 9.1*  HCT 32.2* 32.7* 29.7* 28.5*  PLT 193 147 282 299    COAGS:  Recent Labs  06/30/14 1256 12/06/14 0120  INR 0.97 1.25  APTT 25 40*    BMP:  Recent Labs  07/30/14 0504 07/31/14 0542  11/01/14 1201 11/18/14 1000 11/29/2014 1805 12/06/14 0120 12/06/14 0424  NA 138 136*  < > 144 144 133* 136  --   K 4.2 4.5  < > 3.3* 4.0 2.2* 2.4* 2.4*  CL 99 94*  --   --   --  97 103  --   CO2 26 25  < > 31* '25 25 23  '$ --   GLUCOSE 100* 212*  < > 98 110 159* 120*  --   BUN 11 10  < > 12.4 13.4 24* 17  --   CALCIUM 8.9 9.8  < > 9.7 9.3 8.2* 7.7*  --   CREATININE 0.45* 0.41*  < > 0.7 0.6 0.67 0.56  --   GFRNONAA >90 >90  --   --   --  >90 >90  --   GFRAA >90 >90  --   --   --  >90 >90  --   < > = values in this interval not displayed.  LIVER FUNCTION TESTS:  Recent Labs  10/25/14 1314 11/01/14 1201 11/18/14 1000 11/21/2014 1805  BILITOT 0.26 0.21 <0.20 0.3  AST 30 39* 26 19  ALT 46 72* 53 22  ALKPHOS 102 91 106 128*  PROT 7.2 6.3* 6.3* 6.2  ALBUMIN 3.2*  3.2* 3.0* 2.3*    TUMOR MARKERS: No results for input(s): AFPTM, CEA, CA199, CHROMGRNA in the last 8760 hours.  Assessment and Plan: KENNEDE LUSK is a 59 y.o. female with history of stage IV small cell lung carcinoma, anemia, malnutrition, coronary artery disease who recently presented to the hospital with abdominal pain and subsequent imaging revealing multiple abdominal wall abscesses along the ascending, transverse, proximal descending and sigmoid colon, bowel perforation with diffuse free air tracking about the medial aspect of the liver and superior to the right kidney suspicious for underlying necrotizing infection. The largest abscess is noted along the hepatic flexure and proximal transverse colon as well as the splenic flexure . Patient has no prior history of abdominal surgery .  [facility] has evaluated the patient and they feel that operative treatment would be too extensive with high morbidity/mortality in light of patient's current clinical status. Request has now been received for palliative drainage of the 2 largest abdominal abscesses. Imaging studies have been reviewed. Patient appears to be candidate for CT-guided drainage of the dominant epigastric and left upper quadrant abscesses. WBC currently 11.5, pt with low grade temp (100), patient has received runs of potassium this morning secondary to K of 2.4.  Details/risks of procedure, including but not limited to, internal bleeding, infection/sepsis, injury to adjacent organs, need for prolonged drainage, discussed with patient and son with their understanding and consent. Procedure tent planned for later this afternoon.      Signed: Autumn Messing 12/06/2014, 11:08 AM   I spent a total of 20 minutes  in face to face in clinical consultation, greater than 50% of which was counseling/coordinating care for CT-guided abdominal abscess(es) drainage

## 2014-12-07 ENCOUNTER — Encounter: Payer: Self-pay | Admitting: *Deleted

## 2014-12-07 LAB — VANCOMYCIN, TROUGH: VANCOMYCIN TR: 12.1 ug/mL (ref 10.0–20.0)

## 2014-12-07 LAB — BASIC METABOLIC PANEL
ANION GAP: 7 (ref 5–15)
BUN: 12 mg/dL (ref 6–23)
CALCIUM: 7.9 mg/dL — AB (ref 8.4–10.5)
CO2: 24 mmol/L (ref 19–32)
Chloride: 107 mmol/L (ref 96–112)
Creatinine, Ser: 0.49 mg/dL — ABNORMAL LOW (ref 0.50–1.10)
Glucose, Bld: 137 mg/dL — ABNORMAL HIGH (ref 70–99)
POTASSIUM: 2.7 mmol/L — AB (ref 3.5–5.1)
SODIUM: 138 mmol/L (ref 135–145)

## 2014-12-07 LAB — PREALBUMIN: Prealbumin: 4 mg/dL — ABNORMAL LOW (ref 17–34)

## 2014-12-07 MED ORDER — OXYCODONE-ACETAMINOPHEN 5-325 MG PO TABS
1.0000 | ORAL_TABLET | ORAL | Status: DC | PRN
  Administered 2014-12-07: 1 via ORAL
  Administered 2014-12-08 (×2): 2 via ORAL
  Filled 2014-12-07: qty 2
  Filled 2014-12-07: qty 1
  Filled 2014-12-07: qty 2

## 2014-12-07 MED ORDER — POTASSIUM CHLORIDE 2 MEQ/ML IV SOLN
INTRAVENOUS | Status: DC
  Administered 2014-12-07: 11:00:00 via INTRAVENOUS
  Filled 2014-12-07 (×4): qty 1000

## 2014-12-07 MED ORDER — CETYLPYRIDINIUM CHLORIDE 0.05 % MT LIQD: 7.0000 mL | Freq: Two times a day (BID) | OROMUCOSAL | Status: DC

## 2014-12-07 MED ORDER — POTASSIUM CHLORIDE 10 MEQ/100ML IV SOLN
10.0000 meq | INTRAVENOUS | Status: AC
  Administered 2014-12-07 (×4): 10 meq via INTRAVENOUS
  Filled 2014-12-07 (×2): qty 100

## 2014-12-07 MED ORDER — SODIUM CHLORIDE 0.9 % IV SOLN
INTRAVENOUS | Status: DC
  Filled 2014-12-07: qty 1000

## 2014-12-07 MED ORDER — POTASSIUM CHLORIDE 10 MEQ/100ML IV SOLN: 10.0000 meq | INTRAVENOUS | Status: DC

## 2014-12-07 MED ORDER — VANCOMYCIN HCL IN DEXTROSE 750-5 MG/150ML-% IV SOLN
750.0000 mg | Freq: Three times a day (TID) | INTRAVENOUS | Status: DC
  Administered 2014-12-07 – 2014-12-08 (×4): 750 mg via INTRAVENOUS
  Filled 2014-12-07 (×6): qty 150

## 2014-12-07 NOTE — Progress Notes (Signed)
Referring Physician(s): CCS  Subjective:  abd abscess Drains x 2 placed 4/25 Feels some better  Allergies: Tomato  Medications: Prior to Admission medications   Medication Sig Start Date End Date Taking? Authorizing Provider  aspirin 81 MG tablet Take 81 mg by mouth daily as needed for pain (chest pain).   Yes Historical Provider, MD  calcium-vitamin D (OSCAL WITH D) 500-200 MG-UNIT per tablet Take 1 tablet by mouth 2 (two) times daily. 10/25/14  Yes Maryanna Shape, NP  dexamethasone (DECADRON) 4 MG tablet Take 1 tablet (4 mg total) by mouth 2 (two) times daily with a meal. 11/01/14  Yes Curt Bears, MD  dextromethorphan (DELSYM) 30 MG/5ML liquid Take 5 mLs (30 mg total) by mouth every 12 (twelve) hours as needed for cough. 11/18/14  Yes Adrena E Johnson, PA-C  gabapentin (NEURONTIN) 800 MG tablet Take 800 mg by mouth daily as needed (pain).   Yes Historical Provider, MD  ibuprofen (ADVIL,MOTRIN) 200 MG tablet Take 400 mg by mouth every 6 (six) hours as needed for moderate pain (pain).   Yes Historical Provider, MD  ipratropium-albuterol (DUONEB) 0.5-2.5 (3) MG/3ML SOLN Take 3 mLs by nebulization every 6 (six) hours as needed. 08/02/14  Yes Curt Bears, MD  Multiple Vitamins-Minerals (MULTIVITAMIN WITH MINERALS) tablet Take 1 tablet by mouth daily.   Yes Historical Provider, MD  oxyCODONE-acetaminophen (PERCOCET) 7.5-325 MG per tablet Take 1 tablet by mouth every 6 (six) hours as needed for pain. 11/18/14  Yes Adrena E Johnson, PA-C  potassium chloride SA (K-DUR,KLOR-CON) 20 MEQ tablet TAKE 1 TABLET BY MOUTH EVERY DAY 11/13/14  Yes Curt Bears, MD     Vital Signs: BP 92/64 mmHg  Pulse 97  Temp(Src) 98.9 F (37.2 C) (Oral)  Resp 15  Wt 74.3 kg (163 lb 12.8 oz)  SpO2 88%  Physical Exam  Abdominal: Soft. She exhibits distension. There is tenderness.  Sites of drain are clean and dry NT no bleeding Output milky brown; +odor Drain 1--mid abd: 80 cc yesterday; 10 cc in  JP Drain 2: --Left abd: 65 cc yesterday; 10 cc in JP Cxs: abundant gr neg rods and gr pos cocci   Nursing note and vitals reviewed.   Imaging: US Abdomen Complete  11/26/2014   CLINICAL DATA:  59 year old female with a history of abdominal pain.  Given history of lung cancer with current chemotherapy treatment.  EXAM: ULTRASOUND ABDOMEN COMPLETE  COMPARISON:  None.  FINDINGS: Gallbladder: Small amount of echogenic debris within the gallbladder lumen without reflections. Negative sonographic Murphy's sign. Gallbladder wall measures 4 mm.  Common bile duct: Diameter: 5.2 mm  Liver: Coarsened echotexture of the liver without nodularity. No focal lesion identified.  IVC: No abnormality visualized.  Pancreas: Visualized portion unremarkable.  Spleen: Size and appearance within normal limits.  Right Kidney: Length: 11.6 cm. Echogenicity similar to that of the adjacent liver. No hydronephrosis.  Left Kidney: Length: 12.0 cm. Echogenicity similar to that of the adjacent spleen. Anechoic lesion with posterior acoustic enhancement measures 2.1 cm x 2.5 cm x 2.0 cm compatible with Bosniak 1 cyst.  Abdominal aorta: No aneurysm visualized.  Other findings: None.  IMPRESSION: Sonographic survey is equivocal for acute calculus cholecystitis, as there is evidence of minimal gallbladder wall thickening and debris within the gallbladder, though the sonographic Murphy sign is negative. If there is concern for further evaluation, a nuclear medicine HIDA study may be considered.  Signed,  Dulcy Fanny. Earleen Newport DO  [facility]  [facility]   Electronically Signed   By: Corrie Mckusick D.O.   On: 11/17/2014 19:11   Ct Abdomen Pelvis W Contrast  11/21/2014   CLINICAL DATA:  Acute onset of generalized abdominal pain for 3 days. Diarrhea. Initial encounter.  EXAM: CT ABDOMEN AND PELVIS WITH CONTRAST  TECHNIQUE: Multidetector CT imaging of the abdomen and pelvis was performed using the  standard protocol following bolus administration of intravenous contrast.  CONTRAST:  15m OMNIPAQUE IOHEXOL 300 MG/ML SOLN, 1027mOMNIPAQUE IOHEXOL 300 MG/ML SOLN  COMPARISON:  CT of the chest, abdomen and pelvis performed 10/01/2014, and abdominal ultrasound performed earlier today at 6:27 p.m.  FINDINGS: Mild bibasilar atelectasis is noted.  There is an unusual acute colonic process with multiple wall abscesses about the ascending, transverse, proximal descending and sigmoid colon. There is only mild inflammation about the colon, and this is of uncertain significance. It could reflect an unusual acute necrotic colitis, or may reflect underlying masses, not well seen, though there may have been masses along the hepatic flexure of the colon on the prior CT.  The largest fluid and air containing abscess is noted along the hepatic flexure of the colon and proximal transverse colon, measuring approximately 6.6 x 4.3 cm, and appears contiguous with a collection of fluid and air adjacent to the gallbladder, less well defined in appearance. Additional diffuse free air is noted tracking about the medial aspect of the liver and superior to the right kidney, with air tracking about the diaphragm. The bubbly appearance of the air raises concern for an underlying necrotizing infection.  A 6.0 cm abscess is noted at the splenic flexure of the colon. There appear to be two small wall abscesses along the sigmoid colon, measuring 2.5 cm and 1.7 cm in size.  The liver and spleen are unremarkable in appearance. The gallbladder is not well assessed due to surrounding free fluid and air, but appears grossly normal in size. The pancreas and adrenal glands are unremarkable.  A 2.5 cm cyst is noted at the anterior aspect of the left kidney. A 1.6 cm mildly hypoattenuating focus is noted on delayed images within the posterior aspect of the right kidney. This is not definitely seen on the prior study, and may reflect mild focal  pyelonephritis. The kidneys are otherwise unremarkable. There is no evidence of hydronephrosis. No renal or ureteral stones are seen. No perinephric stranding is appreciated.  The small bowel is unremarkable in appearance. The stomach is within normal limits. No acute vascular abnormalities are seen. Scattered calcification is seen along the abdominal aorta and its branches.  The appendix is not definitely seen; there is no evidence of appendicitis. Scattered diverticulosis is noted along the entirety of the colon, without evidence of diverticulitis.  The bladder is mildly distended and grossly unremarkable. The uterus is unremarkable in appearance. A 3.4 cm right adnexal cyst is likely physiologic in nature. No inguinal lymphadenopathy is seen.  No acute osseous abnormalities are identified. Mild facet disease noted at the lower lumbar spine.  IMPRESSION: 1. Unusual acute colonic process with multiple wall abscesses along the ascending, transverse, proximal descending and sigmoid colon. There is only mild inflammation about the remainder of the colon, and is of uncertain significance. It could reflect an acute necrotic colitis, or possibly underlying masses, not well seen, though there may have been masses along the hepatic flexure of the colon on the prior CT. 2. Bowel perforation with diffuse free air tracking about the medial aspect of the liver  and superior to the right kidney, extending under the diaphragm. The bubbly appearance of the air raises concern for underlying necrotizing infection. The location of bowel perforation is suspected to be the hepatic flexure of the colon, as fluid and air about the gallbladder appears to extend from an abscess at the hepatic flexure. 3. Largest abscess, at the hepatic flexure of the colon and proximal transverse colon, measures 6.6 x 4.3 cm. 6.0 cm abscess noted at the splenic flexure of colon. Apparent two small wall abscess noted along the sigmoid colon, measuring 2.5  cm and 1.7 cm in size. 4. 1.6 cm mildly hypoattenuating focus on delayed images at the posterior aspect of the right kidney. This could reflect mild focal pyelonephritis, or possibly a poorly characterized isodense cyst. Follow-up renal ultrasound could be considered on an elective nonemergent basis. 2.5 cm left renal cyst noted. 5. 3.4 cm right adnexal cyst is likely physiologic, though pelvic ultrasound could be considered for further evaluation on an elective nonemergent basis. 6. Scattered calcification along the abdominal aorta and its branches. 7. Scattered diverticulosis along the entirety of the colon, without evidence of diverticulitis.  Critical Value/emergent results were called by telephone at the time of interpretation on 11/19/2014 at 10:24 pm to Dr. Lacretia Leigh, who verbally acknowledged these results.   Electronically Signed   By: Garald Balding M.D.   On: 11/18/2014 22:31   Ct Image Guided Drainage By Percutaneous Catheter  12/06/2014   CLINICAL DATA:  Abscess  EXAM: CT IMAGE GUIDED DRAINAGE BY PERCUTANEOUS CATHETER; CT CORE BIOPSY RENAL  FLUOROSCOPY TIME:  None  MEDICATIONS AND MEDICAL HISTORY: Versed Three mg, Fentanyl 50 mcg.  Additional Medications: None.  ANESTHESIA/SEDATION: Moderate sedation time: 26 minutes  CONTRAST:  None  PROCEDURE: The procedure, risks, benefits, and alternatives were explained to the patient. Questions regarding the procedure were encouraged and answered. The patient understands and consents to the procedure.  The abdomen was prepped with Betadine in a sterile fashion, and a sterile drape was applied covering the operative field. A sterile gown and sterile gloves were used for the procedure.  Under CT guidance, an 18 gauge needle was inserted into the left upper quadrant abscess and removed over an Amplatz. A 12 French dilator followed by 12 [place] drain were inserted. It was looped and string fixed then sewn to the skin. Frank pus was aspirated. It was attached to  a JP bulb.  The identical procedure was performed for the midline upper abdominal abscess.  FINDINGS: Images demonstrate placement of 2 abdominal abscess drains.  COMPLICATIONS: None  IMPRESSION: Successful abdominal abscess drain x2.   Electronically Signed   By: Marybelle Killings M.D.   On: 12/06/2014 17:28   Ct Image Guided Drainage Percut Cath  Peritoneal Retroperit  12/06/2014   CLINICAL DATA:  Abscess  EXAM: CT IMAGE GUIDED DRAINAGE BY PERCUTANEOUS CATHETER; CT CORE BIOPSY RENAL  FLUOROSCOPY TIME:  None  MEDICATIONS AND MEDICAL HISTORY: Versed Three mg, Fentanyl 50 mcg.  Additional Medications: None.  ANESTHESIA/SEDATION: Moderate sedation time: 26 minutes  CONTRAST:  None  PROCEDURE: The procedure, risks, benefits, and alternatives were explained to the patient. Questions regarding the procedure were encouraged and answered. The patient understands and consents to the procedure.  The abdomen was prepped with Betadine in a sterile fashion, and a sterile drape was applied covering the operative field. A sterile gown and sterile gloves were used for the procedure.  Under CT guidance, an 18 gauge needle was inserted into the left  upper quadrant abscess and removed over an Amplatz. A 12 French dilator followed by 12 [place] drain were inserted. It was looped and string fixed then sewn to the skin. Frank pus was aspirated. It was attached to a JP bulb.  The identical procedure was performed for the midline upper abdominal abscess.  FINDINGS: Images demonstrate placement of 2 abdominal abscess drains.  COMPLICATIONS: None  IMPRESSION: Successful abdominal abscess drain x2.   Electronically Signed   By: Marybelle Killings M.D.   On: 12/06/2014 17:28    Labs:  CBC:  Recent Labs  11/01/14 1201 11/18/14 0959 12/09/2014 1805 12/06/14 0120  WBC 9.9 9.4 16.4* 11.5*  HGB 10.3* 10.5* 9.6* 9.1*  HCT 32.2* 32.7* 29.7* 28.5*  PLT 193 147 282 299    COAGS:  Recent Labs  06/30/14 1256 12/06/14 0120  INR 0.97 1.25    APTT 25 40*    BMP:  Recent Labs  07/31/14 0542  11/18/14 1000 12/10/2014 1805 12/06/14 0120 12/06/14 0424  0920  NA 136*  < > 144 133* 136  --  138  K 4.5  < > 4.0 2.2* 2.4* 2.4* 2.7*  CL 94*  --   --  97 103  --  107  CO2 25  < > '25 25 23  '$ --  24  GLUCOSE 212*  < > 110 159* 120*  --  137*  BUN 10  < > 13.4 24* 17  --  12  CALCIUM 9.8  < > 9.3 8.2* 7.7*  --  7.9*  CREATININE 0.41*  < > 0.6 0.67 0.56  --  0.49*  GFRNONAA >90  --   --  >90 >90  --  >90  GFRAA >90  --   --  >90 >90  --  >90  < > = values in this interval not displayed.  LIVER FUNCTION TESTS:  Recent Labs  10/25/14 1314 11/01/14 1201 11/18/14 1000 11/26/2014 1805  BILITOT 0.26 0.21 <0.20 0.3  AST 30 39* 26 19  ALT 46 72* 53 22  ALKPHOS 102 91 106 128*  PROT 7.2 6.3* 6.3* 6.2  ALBUMIN 3.2* 3.2* 3.0* 2.3*    Assessment and Plan:  Abd abscess drains x 2 Intact drains Draining well Will follow  Signed: Mamoru Takeshita A , 2:04 PM   I spent a total of 15 Minutes in face to face in clinical consultation/evaluation, greater than 50% of which was counseling/coordinating care for abd abscess drains x 2

## 2014-12-07 NOTE — Progress Notes (Signed)
Subjective: She has pancakes, eggs, bacon, and potatoes for breakfast in front of her, she could not eat yesterday.   Drains both have purulent fluid in them. Objective: Vital signs in last 24 hours: Temp:  [98 F (36.7 C)-99.6 F (37.6 C)] 98.1 F (36.7 C) (04/26 0800) Pulse Rate:  [81-103] 93 (04/26 0800) Resp:  [10-25] 19 (04/26 0800) BP: (87-116)/(51-93) 101/67 mmHg (04/26 0800) SpO2:  [92 %-100 %] 100 % (04/26 0800)   143 ml from drains Afebrile, VSS, BP down some last PM/early AM. Last K+ 2.4 Regular diet BMP is pending NO BM Intake/Output from previous day: 04/25 0701 - 04/26 0700 In: 3125 [I.V.:2375; IV Piggyback:750] Out: 1198 [Urine:1055; Drains:143] Intake/Output this shift: Total I/O In: 175 [I.V.:125; IV Piggyback:50] Out: 75 [Urine:75]  General appearance: alert, cooperative and no distress GI: sore but no significant pain hurts to move up in bed.  Lab Results:   Recent Labs  11/23/2014 1805 12/06/14 0120  WBC 16.4* 11.5*  HGB 9.6* 9.1*  HCT 29.7* 28.5*  PLT 282 299    BMET  Recent Labs  12/01/2014 1805 12/06/14 0120 12/06/14 0424  NA 133* 136  --   K 2.2* 2.4* 2.4*  CL 97 103  --   CO2 25 23  --   GLUCOSE 159* 120*  --   BUN 24* 17  --   CREATININE 0.67 0.56  --   CALCIUM 8.2* 7.7*  --    PT/INR  Recent Labs  12/06/14 0120  LABPROT 15.8*  INR 1.25     Recent Labs Lab 11/13/2014 1805  AST 19  ALT 22  ALKPHOS 128*  BILITOT 0.3  PROT 6.2  ALBUMIN 2.3*     Lipase     Component Value Date/Time   LIPASE 15 11/12/2014 1805     Studies/Results: US Abdomen Complete  11/23/2014   CLINICAL DATA:  59 year old female with a history of abdominal pain.  Given history of lung cancer with current chemotherapy treatment.  EXAM: ULTRASOUND ABDOMEN COMPLETE  COMPARISON:  None.  FINDINGS: Gallbladder: Small amount of echogenic debris within the gallbladder lumen without reflections. Negative sonographic Murphy's sign. Gallbladder wall  measures 4 mm.  Common bile duct: Diameter: 5.2 mm  Liver: Coarsened echotexture of the liver without nodularity. No focal lesion identified.  IVC: No abnormality visualized.  Pancreas: Visualized portion unremarkable.  Spleen: Size and appearance within normal limits.  Right Kidney: Length: 11.6 cm. Echogenicity similar to that of the adjacent liver. No hydronephrosis.  Left Kidney: Length: 12.0 cm. Echogenicity similar to that of the adjacent spleen. Anechoic lesion with posterior acoustic enhancement measures 2.1 cm x 2.5 cm x 2.0 cm compatible with Bosniak 1 cyst.  Abdominal aorta: No aneurysm visualized.  Other findings: None.  IMPRESSION: Sonographic survey is equivocal for acute calculus cholecystitis, as there is evidence of minimal gallbladder wall thickening and debris within the gallbladder, though the sonographic Murphy sign is negative. If there is concern for further evaluation, a nuclear medicine HIDA study may be considered.  Signed,  Dulcy Fanny. Earleen Newport, DO  [facility]  [facility]   Electronically Signed   By: Corrie Mckusick D.O.   On: 12/04/2014 19:11   Ct Abdomen Pelvis W Contrast  12/11/2014   CLINICAL DATA:  Acute onset of generalized abdominal pain for 3 days. Diarrhea. Initial encounter.  EXAM: CT ABDOMEN AND PELVIS WITH CONTRAST  TECHNIQUE: Multidetector CT imaging of the abdomen and pelvis was performed using the standard  protocol following bolus administration of intravenous contrast.  CONTRAST:  32m OMNIPAQUE IOHEXOL 300 MG/ML SOLN, 1044mOMNIPAQUE IOHEXOL 300 MG/ML SOLN  COMPARISON:  CT of the chest, abdomen and pelvis performed 10/01/2014, and abdominal ultrasound performed earlier today at 6:27 p.m.  FINDINGS: Mild bibasilar atelectasis is noted.  There is an unusual acute colonic process with multiple wall abscesses about the ascending, transverse, proximal descending and sigmoid colon. There is only mild inflammation about the colon, and  this is of uncertain significance. It could reflect an unusual acute necrotic colitis, or may reflect underlying masses, not well seen, though there may have been masses along the hepatic flexure of the colon on the prior CT.  The largest fluid and air containing abscess is noted along the hepatic flexure of the colon and proximal transverse colon, measuring approximately 6.6 x 4.3 cm, and appears contiguous with a collection of fluid and air adjacent to the gallbladder, less well defined in appearance. Additional diffuse free air is noted tracking about the medial aspect of the liver and superior to the right kidney, with air tracking about the diaphragm. The bubbly appearance of the air raises concern for an underlying necrotizing infection.  A 6.0 cm abscess is noted at the splenic flexure of the colon. There appear to be two small wall abscesses along the sigmoid colon, measuring 2.5 cm and 1.7 cm in size.  The liver and spleen are unremarkable in appearance. The gallbladder is not well assessed due to surrounding free fluid and air, but appears grossly normal in size. The pancreas and adrenal glands are unremarkable.  A 2.5 cm cyst is noted at the anterior aspect of the left kidney. A 1.6 cm mildly hypoattenuating focus is noted on delayed images within the posterior aspect of the right kidney. This is not definitely seen on the prior study, and may reflect mild focal pyelonephritis. The kidneys are otherwise unremarkable. There is no evidence of hydronephrosis. No renal or ureteral stones are seen. No perinephric stranding is appreciated.  The small bowel is unremarkable in appearance. The stomach is within normal limits. No acute vascular abnormalities are seen. Scattered calcification is seen along the abdominal aorta and its branches.  The appendix is not definitely seen; there is no evidence of appendicitis. Scattered diverticulosis is noted along the entirety of the colon, without evidence of  diverticulitis.  The bladder is mildly distended and grossly unremarkable. The uterus is unremarkable in appearance. A 3.4 cm right adnexal cyst is likely physiologic in nature. No inguinal lymphadenopathy is seen.  No acute osseous abnormalities are identified. Mild facet disease noted at the lower lumbar spine.  IMPRESSION: 1. Unusual acute colonic process with multiple wall abscesses along the ascending, transverse, proximal descending and sigmoid colon. There is only mild inflammation about the remainder of the colon, and is of uncertain significance. It could reflect an acute necrotic colitis, or possibly underlying masses, not well seen, though there may have been masses along the hepatic flexure of the colon on the prior CT. 2. Bowel perforation with diffuse free air tracking about the medial aspect of the liver and superior to the right kidney, extending under the diaphragm. The bubbly appearance of the air raises concern for underlying necrotizing infection. The location of bowel perforation is suspected to be the hepatic flexure of the colon, as fluid and air about the gallbladder appears to extend from an abscess at the hepatic flexure. 3. Largest abscess, at the hepatic flexure of the colon and proximal transverse  colon, measures 6.6 x 4.3 cm. 6.0 cm abscess noted at the splenic flexure of colon. Apparent two small wall abscess noted along the sigmoid colon, measuring 2.5 cm and 1.7 cm in size. 4. 1.6 cm mildly hypoattenuating focus on delayed images at the posterior aspect of the right kidney. This could reflect mild focal pyelonephritis, or possibly a poorly characterized isodense cyst. Follow-up renal ultrasound could be considered on an elective nonemergent basis. 2.5 cm left renal cyst noted. 5. 3.4 cm right adnexal cyst is likely physiologic, though pelvic ultrasound could be considered for further evaluation on an elective nonemergent basis. 6. Scattered calcification along the abdominal aorta and  its branches. 7. Scattered diverticulosis along the entirety of the colon, without evidence of diverticulitis.  Critical Value/emergent results were called by telephone at the time of interpretation on 11/25/2014 at 10:24 pm to Dr. Lacretia Leigh, who verbally acknowledged these results.   Electronically Signed   By: Garald Balding M.D.   On: 11/26/2014 22:31   Ct Image Guided Drainage By Percutaneous Catheter  12/06/2014   CLINICAL DATA:  Abscess  EXAM: CT IMAGE GUIDED DRAINAGE BY PERCUTANEOUS CATHETER; CT CORE BIOPSY RENAL  FLUOROSCOPY TIME:  None  MEDICATIONS AND MEDICAL HISTORY: Versed Three mg, Fentanyl 50 mcg.  Additional Medications: None.  ANESTHESIA/SEDATION: Moderate sedation time: 26 minutes  CONTRAST:  None  PROCEDURE: The procedure, risks, benefits, and alternatives were explained to the patient. Questions regarding the procedure were encouraged and answered. The patient understands and consents to the procedure.  The abdomen was prepped with Betadine in a sterile fashion, and a sterile drape was applied covering the operative field. A sterile gown and sterile gloves were used for the procedure.  Under CT guidance, an 18 gauge needle was inserted into the left upper quadrant abscess and removed over an Amplatz. A 12 French dilator followed by 12 [place] drain were inserted. It was looped and string fixed then sewn to the skin. Frank pus was aspirated. It was attached to a JP bulb.  The identical procedure was performed for the midline upper abdominal abscess.  FINDINGS: Images demonstrate placement of 2 abdominal abscess drains.  COMPLICATIONS: None  IMPRESSION: Successful abdominal abscess drain x2.   Electronically Signed   By: Marybelle Killings M.D.   On: 12/06/2014 17:28   Ct Image Guided Drainage Percut Cath  Peritoneal Retroperit  12/06/2014   CLINICAL DATA:  Abscess  EXAM: CT IMAGE GUIDED DRAINAGE BY PERCUTANEOUS CATHETER; CT CORE BIOPSY RENAL  FLUOROSCOPY TIME:  None  MEDICATIONS AND MEDICAL  HISTORY: Versed Three mg, Fentanyl 50 mcg.  Additional Medications: None.  ANESTHESIA/SEDATION: Moderate sedation time: 26 minutes  CONTRAST:  None  PROCEDURE: The procedure, risks, benefits, and alternatives were explained to the patient. Questions regarding the procedure were encouraged and answered. The patient understands and consents to the procedure.  The abdomen was prepped with Betadine in a sterile fashion, and a sterile drape was applied covering the operative field. A sterile gown and sterile gloves were used for the procedure.  Under CT guidance, an 18 gauge needle was inserted into the left upper quadrant abscess and removed over an Amplatz. A 12 French dilator followed by 12 [place] drain were inserted. It was looped and string fixed then sewn to the skin. Frank pus was aspirated. It was attached to a JP bulb.  The identical procedure was performed for the midline upper abdominal abscess.  FINDINGS: Images demonstrate placement of 2 abdominal abscess drains.  COMPLICATIONS: None  IMPRESSION: Successful abdominal abscess drain x2.   Electronically Signed   By: Marybelle Killings M.D.   On: 12/06/2014 17:28    Medications: . sodium chloride   Intravenous Once  . [START ON 12-12-14] antiseptic oral rinse  7 mL Mouth Rinse BID  . dexamethasone  4 mg Intravenous Q12H  . piperacillin-tazobactam (ZOSYN)  IV  3.375 g Intravenous Q8H  . vancomycin  1,000 mg Intravenous Q12H    Assessment/Plan 1. Diffuse colonic infectious process with multiple abscess the largest of which are at hepatic flexure and splenic flexure with evidence of perforation-WBC down; on broad spectrum abxs IR drain placement x 2 12/06/14, Dr. Barbie Banner, IR. 2. 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  Day 3 of Zosyn 4.  DVT: SCD's/ not on heparin or Lovenox  5.  Hypokalemia   Plan:  I will defer K+ replacement to Medicine.  I would go slower with  PO's, but we can see how she does. Continue antibiotics and conservative treatment.    LOS: 2 days    Delainee Tramel

## 2014-12-07 NOTE — Progress Notes (Signed)
CRITICAL VALUE ALERT  Critical value received:  POTASSIUM 2.7  Date of notification:   Time of notification:  5643 Critical value read back:Yes.    Nurse who received alert:  Duard Larsen, RN  MD notified (1st page):  DR. Beacon  Time of first page:  1015

## 2014-12-07 NOTE — Progress Notes (Signed)
ANTIBIOTIC CONSULT NOTE - Follow up  Pharmacy Consult for Vancomycin & Zosyn Indication: Intra-abdominal infection/Sepsis  Allergies  Allergen Reactions  . Tomato     Patient Measurements: Weight: 163 lb 12.8 oz (74.3 kg) Height: 64 inches  Vital Signs: Temp: 98.1 F (36.7 C) (04/26 0800) Temp Source: Oral (04/26 0800) BP: 91/53 mmHg (04/26 1000) Pulse Rate: 87 (04/26 1000) Intake/Output from previous day: 04/25 0701 - 04/26 0700 In: 3125 [I.V.:2375; IV Piggyback:750] Out: 1198 [Urine:1055; Drains:143] Intake/Output from this shift: Total I/O In: 275 [I.V.:125; IV Piggyback:150] Out: 75 [Urine:75]  Labs:  Recent Labs  11/26/2014 1805 12/06/14 0120  0920  WBC 16.4* 11.5*  --   HGB 9.6* 9.1*  --   PLT 282 299  --   CREATININE 0.67 0.56 0.49*   Estimated Creatinine Clearance: 77.5 mL/min (by C-G formula based on Cr of 0.49).  Recent Labs   1205  [place] 12.1     Microbiology: Recent Results (from the past 720 hour(s))  TECHNOLOGIST REVIEW     Status: None   Collection Time: 11/18/14  9:59 AM  Result Value Ref Range Status   Technologist Review Metas and Myelocytes present. 9% nrbc  Final  MRSA PCR Screening     Status: None   Collection Time: 12/06/2014 11:52 PM  Result Value Ref Range Status   MRSA by PCR NEGATIVE NEGATIVE Final    Comment:        The GeneXpert MRSA Assay (FDA approved for NASAL specimens only), is one component of a comprehensive MRSA colonization surveillance program. It is not intended to diagnose MRSA infection nor to guide or monitor treatment for MRSA infections.   Culture, blood (x 2)     Status: None (Preliminary result)   Collection Time: 12/06/14  1:20 AM  Result Value Ref Range Status   Specimen Description BLOOD LEFT ARM  Final   Special Requests BOTTLES DRAWN AEROBIC AND ANAEROBIC 6CC  Final   Culture   Final           BLOOD CULTURE RECEIVED NO GROWTH TO DATE CULTURE WILL BE HELD FOR 5 DAYS BEFORE  ISSUING A FINAL NEGATIVE REPORT Performed at [facility]    Report Status PENDING  Incomplete  Culture, blood (x 2)     Status: None (Preliminary result)   Collection Time: 12/06/14  1:25 AM  Result Value Ref Range Status   Specimen Description BLOOD RIGHT HAND  Final   Special Requests BOTTLES DRAWN AEROBIC ONLY [place]  Final   Culture   Final           BLOOD CULTURE RECEIVED NO GROWTH TO DATE CULTURE WILL BE HELD FOR 5 DAYS BEFORE ISSUING A FINAL NEGATIVE REPORT Performed at [facility]    Report Status PENDING  Incomplete  Culture, routine-abscess     Status: None (Preliminary result)   Collection Time: 12/06/14  4:36 PM  Result Value Ref Range Status   Specimen Description ABSCESS LEFT ABDOMEN  Final   Special Requests NONE  Final   Gram Stain   Final    FEW WBC PRESENT,BOTH PMN AND MONONUCLEAR NO SQUAMOUS EPITHELIAL CELLS SEEN ABUNDANT GRAM POSITIVE COCCI IN PAIRS IN CLUSTERS ABUNDANT GRAM NEGATIVE RODS MODERATE GRAM POSITIVE RODS    Culture PENDING  Incomplete   Report Status PENDING  Incomplete  Culture, routine-abscess     Status: None (Preliminary result)   Collection Time: 12/06/14  4:36 PM  Result Value Ref Range Status   Specimen  Description ABSCESS ABDOMEN MID  Final   Special Requests NONE  Final   Gram Stain   Final    NO WBC SEEN NO SQUAMOUS EPITHELIAL CELLS SEEN ABUNDANT GRAM POSITIVE RODS ABUNDANT GRAM NEGATIVE RODS FEW GRAM POSITIVE COCCI    Culture NO GROWTH Performed at [facility]   Final   Report Status PENDING  Incomplete   Assessment: 59 yr female with h/o lung cancer presents with abdominal pain. CT abdomen shows bowel perforation, abscesses along colon. Pharmacy consulted to dose Vancomycin and Zosyn for intra-abdominal infection / sepsis.  Afebrile the last 24hrs  WBC elev but improved(decadron)  SCr <1, CrCl 78CG, 89N  Vanc trough = 12.1 on 1g q12h. Slightly below goal.  4/25 blood x2: ngtd 4/25 abscess L  abd: polymicrobial  Goal of Therapy:  Vancomycin trough level 15-20 mcg/ml  Appropriate antibiotic dosing for renal function; eradication of infection  Plan:  Change Vancomycin to '750mg'$  IV q8h. Zosyn 3.375g IV Q8H infused over 4hrs. Measure Vanc trough at steady state. Follow up renal fxn, culture results, and clinical course.  Romeo Rabon, PharmD, pager [phone]. ,12:51 PM.

## 2014-12-07 NOTE — [facility] Oncology Navigator Note (Unsigned)
Saw patient at [facility] today.  She looks better than yesterday, much more awake.  No barriers identified at this time.  Her son at bedside.

## 2014-12-07 NOTE — Progress Notes (Signed)
[facility] PROGRESS NOTE  KINAYA HILLIKER GHW:[id number] DOB: [birth year] DOA: 11/12/2014 PCP: No PCP Per Patient  Interim summary 59 yo F with stage IV lung cancer that was diagnosed in November 2015, MRI of the brain performed on 06/18/2014 showing for metastatic deposits in the brain, undergoing systemic chemotherapy with carboplatin and etoposide status post 6 cycles, admitted to the medicine service on 12/06/2014 when she presented with complaints of abdominal pain associate with a steep functional decline over the past 3-4 days. CT scan of abdomen and pelvis performed on admission showed multiple wall abscesses along the ascending, transverse, proximal descending and sigmoid colon. Radiology feeling that this could reflect underlying masses. There is also evidence of bowel perforation with diffuse free air tracking and medial aspect of the liver and superior to the right kidney. General surgery consulted.  Subjective: - feels better, less abdominal pain. Rates it as 8/10, before drains it was "unbearable" - no vomiting, trying to eat - denies dyspnea  Assessment/Plan: Colonic wall abscesses/bowel perforation.  - Patient having a history of metastatic small cell lung cancer, presented with abdominal pain with CT imaging showing the presence of multiple wall abscesses involving colon.  - There was a large abscess along the hepatic flexure of the colon containing both fluid and air, highly suspicious for bowel perforation. - general surgery consulted, following. Recommended IR drainage which was done 4/25, with pus coming out - microbiology from aspirate showing GNR and GPC. Patient is on Zosyn, continue broad spectrum pending speciation.   Metastatic lung cancer - Patient finished her chemotherapy, will be getting whole brain radiation - Dr. Julien Nordmann able to see pt yesterday, appreciate input  Sepsis - Present on admission, evidenced by heart rate of 106 with temperature 101.8,  encephalopathy, secondary to acute intra-abdominal abscess - sepsis physiology has improved however still borderline hypotensive today, continue to monitor in SDU today   Hypokalemia. - persistent, will all K to IVF  Toxic encephalopathy. - Patient presenting with steep functional decline/failure to thrive, likely secondary to combination of advanced lung cancer along with intra-abdominal infectious processes/bowel perforation/peritonitis - Continue broad-spectrum IV antimicrobial therapy - mental status clearing today, however still sluggish  Goals of Care - Dr. Coralyn Pear spoke with her son who was present at bedside regarding goals, he stated he didn't want his mother to suffer however wanted "to take my mother home." He wishes for full scope of treatment, including CPR.  - I did not readdress goals of care again today    Code Status: Full Code Family Communication: I spoke with her son who was present at bedside Disposition Plan: Continue supportive care   Consultants:  General surgery  Oncology  IR   Antibiotics:  IV vancomycin  IV Zosyn   Objective: Filed Vitals:    0715  BP: 101/71  Pulse: 86  Temp:   Resp: 17    Intake/Output Summary (Last 24 hours) at  0719 Last data filed at  0700  Gross per 24 hour  Intake   3125 ml  Output   1198 ml  Net   1927 ml   Filed Weights   11/17/2014 2346  Weight: 74.3 kg (163 lb 12.8 oz)    Exam:  General:  Patient is Alert, slow to respond  HEENT; no scleral icterus   Cardiovascular: Tachycardic, Regular rate and rhythm normal S1-S2 no murmurs rubs or gallops  Respiratory: Coarse respiratory sounds, having a few by basilar crackles, normal respiratory effort  Abdomen: pain to  palpation throughout, 2 drains in place  Skin: no rashes  Musculoskeletal: No joint swelling  Neuro: non focal   Data Reviewed: Basic Metabolic Panel:  Recent Labs Lab 12/06/2014 1805 12/06/14 0120  12/06/14 0424  NA 133* 136  --   K 2.2* 2.4* 2.4*  CL 97 103  --   CO2 25 23  --   GLUCOSE 159* 120*  --   BUN 24* 17  --   CREATININE 0.67 0.56  --   CALCIUM 8.2* 7.7*  --   MG  --  1.3*  --    Liver Function Tests:  Recent Labs Lab 11/28/2014 1805  AST 19  ALT 22  ALKPHOS 128*  BILITOT 0.3  PROT 6.2  ALBUMIN 2.3*    Recent Labs Lab 12/09/2014 1805  LIPASE 15   CBC:  Recent Labs Lab 11/24/2014 1805 12/06/14 0120  WBC 16.4* 11.5*  NEUTROABS 13.5*  --   HGB 9.6* 9.1*  HCT 29.7* 28.5*  MCV 93.7 94.7  PLT 282 299   ProBNP (last 3 results)  Recent Labs  06/01/14 1614  PROBNP 68.9    CBG: No results for input(s): GLUCAP in the last 168 hours.  Recent Results (from the past 240 hour(s))  MRSA PCR Screening     Status: None   Collection Time: 11/23/2014 11:52 PM  Result Value Ref Range Status   MRSA by PCR NEGATIVE NEGATIVE Final    Comment:        The GeneXpert MRSA Assay (FDA approved for NASAL specimens only), is one component of a comprehensive MRSA colonization surveillance program. It is not intended to diagnose MRSA infection nor to guide or monitor treatment for MRSA infections.      Studies: US Abdomen Complete  12/02/2014   CLINICAL DATA:  59 year old female with a history of abdominal pain.  Given history of lung cancer with current chemotherapy treatment.  EXAM: ULTRASOUND ABDOMEN COMPLETE  COMPARISON:  None.  FINDINGS: Gallbladder: Small amount of echogenic debris within the gallbladder lumen without reflections. Negative sonographic Murphy's sign. Gallbladder wall measures 4 mm.  Common bile duct: Diameter: 5.2 mm  Liver: Coarsened echotexture of the liver without nodularity. No focal lesion identified.  IVC: No abnormality visualized.  Pancreas: Visualized portion unremarkable.  Spleen: Size and appearance within normal limits.  Right Kidney: Length: 11.6 cm. Echogenicity similar to that of the adjacent liver. No hydronephrosis.  Left Kidney:  Length: 12.0 cm. Echogenicity similar to that of the adjacent spleen. Anechoic lesion with posterior acoustic enhancement measures 2.1 cm x 2.5 cm x 2.0 cm compatible with Bosniak 1 cyst.  Abdominal aorta: No aneurysm visualized.  Other findings: None.  IMPRESSION: Sonographic survey is equivocal for acute calculus cholecystitis, as there is evidence of minimal gallbladder wall thickening and debris within the gallbladder, though the sonographic Murphy sign is negative. If there is concern for further evaluation, a nuclear medicine HIDA study may be considered.  Signed,  Dulcy Fanny. Earleen Newport, DO  [facility]  [facility]   Electronically Signed   By: Corrie Mckusick D.O.   On: 11/29/2014 19:11   Ct Abdomen Pelvis W Contrast  11/18/2014   CLINICAL DATA:  Acute onset of generalized abdominal pain for 3 days. Diarrhea. Initial encounter.  EXAM: CT ABDOMEN AND PELVIS WITH CONTRAST  TECHNIQUE: Multidetector CT imaging of the abdomen and pelvis was performed using the standard protocol following bolus administration of intravenous contrast.  CONTRAST:  51m OMNIPAQUE IOHEXOL 300 MG/ML  SOLN, 180m OMNIPAQUE IOHEXOL 300 MG/ML SOLN  COMPARISON:  CT of the chest, abdomen and pelvis performed 10/01/2014, and abdominal ultrasound performed earlier today at 6:27 p.m.  FINDINGS: Mild bibasilar atelectasis is noted.  There is an unusual acute colonic process with multiple wall abscesses about the ascending, transverse, proximal descending and sigmoid colon. There is only mild inflammation about the colon, and this is of uncertain significance. It could reflect an unusual acute necrotic colitis, or may reflect underlying masses, not well seen, though there may have been masses along the hepatic flexure of the colon on the prior CT.  The largest fluid and air containing abscess is noted along the hepatic flexure of the colon and proximal transverse colon, measuring approximately 6.6 x 4.3  cm, and appears contiguous with a collection of fluid and air adjacent to the gallbladder, less well defined in appearance. Additional diffuse free air is noted tracking about the medial aspect of the liver and superior to the right kidney, with air tracking about the diaphragm. The bubbly appearance of the air raises concern for an underlying necrotizing infection.  A 6.0 cm abscess is noted at the splenic flexure of the colon. There appear to be two small wall abscesses along the sigmoid colon, measuring 2.5 cm and 1.7 cm in size.  The liver and spleen are unremarkable in appearance. The gallbladder is not well assessed due to surrounding free fluid and air, but appears grossly normal in size. The pancreas and adrenal glands are unremarkable.  A 2.5 cm cyst is noted at the anterior aspect of the left kidney. A 1.6 cm mildly hypoattenuating focus is noted on delayed images within the posterior aspect of the right kidney. This is not definitely seen on the prior study, and may reflect mild focal pyelonephritis. The kidneys are otherwise unremarkable. There is no evidence of hydronephrosis. No renal or ureteral stones are seen. No perinephric stranding is appreciated.  The small bowel is unremarkable in appearance. The stomach is within normal limits. No acute vascular abnormalities are seen. Scattered calcification is seen along the abdominal aorta and its branches.  The appendix is not definitely seen; there is no evidence of appendicitis. Scattered diverticulosis is noted along the entirety of the colon, without evidence of diverticulitis.  The bladder is mildly distended and grossly unremarkable. The uterus is unremarkable in appearance. A 3.4 cm right adnexal cyst is likely physiologic in nature. No inguinal lymphadenopathy is seen.  No acute osseous abnormalities are identified. Mild facet disease noted at the lower lumbar spine.  IMPRESSION: 1. Unusual acute colonic process with multiple wall abscesses along  the ascending, transverse, proximal descending and sigmoid colon. There is only mild inflammation about the remainder of the colon, and is of uncertain significance. It could reflect an acute necrotic colitis, or possibly underlying masses, not well seen, though there may have been masses along the hepatic flexure of the colon on the prior CT. 2. Bowel perforation with diffuse free air tracking about the medial aspect of the liver and superior to the right kidney, extending under the diaphragm. The bubbly appearance of the air raises concern for underlying necrotizing infection. The location of bowel perforation is suspected to be the hepatic flexure of the colon, as fluid and air about the gallbladder appears to extend from an abscess at the hepatic flexure. 3. Largest abscess, at the hepatic flexure of the colon and proximal transverse colon, measures 6.6 x 4.3 cm. 6.0 cm abscess noted at the splenic flexure of  colon. Apparent two small wall abscess noted along the sigmoid colon, measuring 2.5 cm and 1.7 cm in size. 4. 1.6 cm mildly hypoattenuating focus on delayed images at the posterior aspect of the right kidney. This could reflect mild focal pyelonephritis, or possibly a poorly characterized isodense cyst. Follow-up renal ultrasound could be considered on an elective nonemergent basis. 2.5 cm left renal cyst noted. 5. 3.4 cm right adnexal cyst is likely physiologic, though pelvic ultrasound could be considered for further evaluation on an elective nonemergent basis. 6. Scattered calcification along the abdominal aorta and its branches. 7. Scattered diverticulosis along the entirety of the colon, without evidence of diverticulitis.  Critical Value/emergent results were called by telephone at the time of interpretation on 11/14/2014 at 10:24 pm to Dr. Lacretia Leigh, who verbally acknowledged these results.   Electronically Signed   By: Garald Balding M.D.   On: 11/19/2014 22:31   Ct Image Guided Drainage By  Percutaneous Catheter  12/06/2014   CLINICAL DATA:  Abscess  EXAM: CT IMAGE GUIDED DRAINAGE BY PERCUTANEOUS CATHETER; CT CORE BIOPSY RENAL  FLUOROSCOPY TIME:  None  MEDICATIONS AND MEDICAL HISTORY: Versed Three mg, Fentanyl 50 mcg.  Additional Medications: None.  ANESTHESIA/SEDATION: Moderate sedation time: 26 minutes  CONTRAST:  None  PROCEDURE: The procedure, risks, benefits, and alternatives were explained to the patient. Questions regarding the procedure were encouraged and answered. The patient understands and consents to the procedure.  The abdomen was prepped with Betadine in a sterile fashion, and a sterile drape was applied covering the operative field. A sterile gown and sterile gloves were used for the procedure.  Under CT guidance, an 18 gauge needle was inserted into the left upper quadrant abscess and removed over an Amplatz. A 12 French dilator followed by 12 [place] drain were inserted. It was looped and string fixed then sewn to the skin. Frank pus was aspirated. It was attached to a JP bulb.  The identical procedure was performed for the midline upper abdominal abscess.  FINDINGS: Images demonstrate placement of 2 abdominal abscess drains.  COMPLICATIONS: None  IMPRESSION: Successful abdominal abscess drain x2.   Electronically Signed   By: Marybelle Killings M.D.   On: 12/06/2014 17:28   Ct Image Guided Drainage Percut Cath  Peritoneal Retroperit  12/06/2014   CLINICAL DATA:  Abscess  EXAM: CT IMAGE GUIDED DRAINAGE BY PERCUTANEOUS CATHETER; CT CORE BIOPSY RENAL  FLUOROSCOPY TIME:  None  MEDICATIONS AND MEDICAL HISTORY: Versed Three mg, Fentanyl 50 mcg.  Additional Medications: None.  ANESTHESIA/SEDATION: Moderate sedation time: 26 minutes  CONTRAST:  None  PROCEDURE: The procedure, risks, benefits, and alternatives were explained to the patient. Questions regarding the procedure were encouraged and answered. The patient understands and consents to the procedure.  The abdomen was prepped with Betadine  in a sterile fashion, and a sterile drape was applied covering the operative field. A sterile gown and sterile gloves were used for the procedure.  Under CT guidance, an 18 gauge needle was inserted into the left upper quadrant abscess and removed over an Amplatz. A 12 French dilator followed by 12 [place] drain were inserted. It was looped and string fixed then sewn to the skin. Frank pus was aspirated. It was attached to a JP bulb.  The identical procedure was performed for the midline upper abdominal abscess.  FINDINGS: Images demonstrate placement of 2 abdominal abscess drains.  COMPLICATIONS: None  IMPRESSION: Successful abdominal abscess drain x2.   Electronically Signed   By: Arnell Sieving  Hoss M.D.   On: 12/06/2014 17:28    Scheduled Meds: . sodium chloride   Intravenous Once  . [START ON Dec 15, 2014] antiseptic oral rinse  7 mL Mouth Rinse BID  . dexamethasone  4 mg Intravenous Q12H  . piperacillin-tazobactam (ZOSYN)  IV  3.375 g Intravenous Q8H  . vancomycin  1,000 mg Intravenous Q12H   Continuous Infusions: . sodium chloride 125 mL/hr at 12/06/14 1900    Active Problems:   Bowel perforation   Hypokalemia   Hyponatremia   Anemia   Abdominal abscess   [place] Board  [facility] Pager [phone]. If 7PM-7AM, please contact night-coverage at [email or web address], password [facility] , 7:19 AM  LOS: 2 days

## 2014-12-08 ENCOUNTER — Ambulatory Visit (HOSPITAL_COMMUNITY): Payer: Medicaid Other

## 2014-12-08 ENCOUNTER — Other Ambulatory Visit: Payer: Medicaid Other

## 2014-12-08 ENCOUNTER — Inpatient Hospital Stay (HOSPITAL_COMMUNITY): Payer: Medicaid Other

## 2014-12-08 DIAGNOSIS — K651 Peritoneal abscess: Secondary | ICD-10-CM

## 2014-12-08 DIAGNOSIS — C3490 Malignant neoplasm of unspecified part of unspecified bronchus or lung: Secondary | ICD-10-CM

## 2014-12-08 DIAGNOSIS — J9601 Acute respiratory failure with hypoxia: Secondary | ICD-10-CM | POA: Diagnosis present

## 2014-12-08 LAB — CBC
HCT: 31 % — ABNORMAL LOW (ref 36.0–46.0)
Hemoglobin: 10 g/dL — ABNORMAL LOW (ref 12.0–15.0)
MCH: 30.2 pg (ref 26.0–34.0)
MCHC: 32.3 g/dL (ref 30.0–36.0)
MCV: 93.7 fL (ref 78.0–100.0)
Platelets: 167 10*3/uL (ref 150–400)
RBC: 3.31 MIL/uL — ABNORMAL LOW (ref 3.87–5.11)
RDW: 19.6 % — AB (ref 11.5–15.5)
WBC: 17.1 10*3/uL — AB (ref 4.0–10.5)

## 2014-12-08 LAB — COMPREHENSIVE METABOLIC PANEL
ALT: 36 U/L — AB (ref 0–35)
AST: 34 U/L (ref 0–37)
Albumin: 1.9 g/dL — ABNORMAL LOW (ref 3.5–5.2)
Alkaline Phosphatase: 218 U/L — ABNORMAL HIGH (ref 39–117)
Anion gap: 9 (ref 5–15)
BUN: 15 mg/dL (ref 6–23)
CO2: 22 mmol/L (ref 19–32)
Calcium: 8.5 mg/dL (ref 8.4–10.5)
Chloride: 109 mmol/L (ref 96–112)
Creatinine, Ser: 0.52 mg/dL (ref 0.50–1.10)
GFR calc Af Amer: >90 mL/min (ref >90)
Glucose, Bld: 140 mg/dL — ABNORMAL HIGH (ref 70–99)
Potassium: 3.3 mmol/L — ABNORMAL LOW (ref 3.5–5.1)
SODIUM: 140 mmol/L (ref 135–145)
Total Bilirubin: 0.3 mg/dL (ref 0.3–1.2)
Total Protein: 6 g/dL (ref 6.0–8.3)

## 2014-12-08 MED ORDER — FENTANYL CITRATE (PF) 100 MCG/2ML IJ SOLN
INTRAMUSCULAR | Status: AC
  Filled 2014-12-08: qty 2

## 2014-12-08 MED ORDER — FUROSEMIDE 10 MG/ML IJ SOLN
INTRAMUSCULAR | Status: AC
  Filled 2014-12-08: qty 4

## 2014-12-08 MED ORDER — ROCURONIUM BROMIDE 50 MG/5ML IV SOLN
INTRAVENOUS | Status: AC
  Filled 2014-12-08: qty 2

## 2014-12-08 MED ORDER — MIDAZOLAM HCL 2 MG/2ML IJ SOLN
INTRAMUSCULAR | Status: AC
  Filled 2014-12-08: qty 2

## 2014-12-08 MED ORDER — ETOMIDATE 2 MG/ML IV SOLN
INTRAVENOUS | Status: AC
  Filled 2014-12-08: qty 20

## 2014-12-08 MED ORDER — SUCCINYLCHOLINE CHLORIDE 20 MG/ML IJ SOLN
INTRAMUSCULAR | Status: AC
  Filled 2014-12-08: qty 1

## 2014-12-08 MED ORDER — LIDOCAINE HCL (CARDIAC) 20 MG/ML IV SOLN
INTRAVENOUS | Status: AC
  Filled 2014-12-08: qty 5

## 2014-12-08 MED ORDER — LORAZEPAM 2 MG/ML IJ SOLN
1.0000 mg | Freq: Once | INTRAMUSCULAR | Status: AC
  Administered 2014-12-08: 1 mg via INTRAVENOUS
  Filled 2014-12-08: qty 1

## 2014-12-08 MED ORDER — FUROSEMIDE 10 MG/ML IJ SOLN
40.0000 mg | Freq: Four times a day (QID) | INTRAMUSCULAR | Status: AC
  Administered 2014-12-08 (×2): 40 mg via INTRAVENOUS
  Filled 2014-12-08: qty 4

## 2014-12-08 MED ORDER — MORPHINE SULFATE 2 MG/ML IJ SOLN
2.0000 mg | INTRAMUSCULAR | Status: DC | PRN
  Administered 2014-12-08 (×2): 2 mg via INTRAVENOUS
  Filled 2014-12-08: qty 1
  Filled 2014-12-08: qty 2

## 2014-12-09 LAB — CULTURE, ROUTINE-ABSCESS: Gram Stain: NONE SEEN

## 2014-12-10 ENCOUNTER — Ambulatory Visit: Payer: Medicaid Other | Admitting: Physician Assistant

## 2014-12-10 LAB — TYPE AND SCREEN
ABO/RH(D): B POS
Antibody Screen: NEGATIVE
UNIT DIVISION: 0
UNIT DIVISION: 0
Unit division: 0

## 2014-12-12 LAB — CULTURE, BLOOD (ROUTINE X 2)
CULTURE: NO GROWTH
Culture: NO GROWTH

## 2014-12-12 NOTE — Progress Notes (Signed)
PULMONARY / CRITICAL CARE MEDICINE   Name: Mariah Park MRN: [id number] DOB: August 17, 1955    ADMISSION DATE:  12/04/2014 CONSULTATION DATE:  12-19-2014  REFERRING MD :  Glade Stanford  CHIEF COMPLAINT:  Shortness of breath, unresponsive  INITIAL PRESENTATION: 59 y/o female with extensive stage small cell lung cancer who was treated with chemotherapy after her diagnosis in November 2015.  She was admitted to [facility] on 4/24 with colonic perforation and abdominal abscesses which were caused by metastatic lesions in her bowel.  Surgery was consulted and stated that she was not a surgical candidate due to her advanced cancer.  IR placed drains in the abscesses.  PCCM was consulted on 4/27 for worsening dyspnea, acute encephalopathy.  STUDIES:  4/24 CT abdomen> multiple abdominal abscesses, bowel perforation, multiple bowel masses  SIGNIFICANT EVENTS:   HISTORY OF PRESENT ILLNESS:  As per initial presentation above, the patient was encephalopathic so history could not be obtained from her.  PAST MEDICAL HISTORY :   has a past medical history of Brain metastases (06/21/2014) and Lung cancer.  has no past surgical history on file. Prior to Admission medications   Medication Sig Start Date End Date Taking? Authorizing Provider  aspirin 81 MG tablet Take 81 mg by mouth daily as needed for pain (chest pain).   Yes Historical Provider, MD  calcium-vitamin D (OSCAL WITH D) 500-200 MG-UNIT per tablet Take 1 tablet by mouth 2 (two) times daily. 10/25/14  Yes Maryanna Shape, NP  dexamethasone (DECADRON) 4 MG tablet Take 1 tablet (4 mg total) by mouth 2 (two) times daily with a meal. 11/01/14  Yes Curt Bears, MD  dextromethorphan (DELSYM) 30 MG/5ML liquid Take 5 mLs (30 mg total) by mouth every 12 (twelve) hours as needed for cough. 11/18/14  Yes Adrena E Johnson, PA-C  gabapentin (NEURONTIN) 800 MG tablet Take 800 mg by mouth daily as needed (pain).   Yes Historical Provider, MD  ibuprofen (ADVIL,MOTRIN)  200 MG tablet Take 400 mg by mouth every 6 (six) hours as needed for moderate pain (pain).   Yes Historical Provider, MD  ipratropium-albuterol (DUONEB) 0.5-2.5 (3) MG/3ML SOLN Take 3 mLs by nebulization every 6 (six) hours as needed. 08/02/14  Yes Curt Bears, MD  Multiple Vitamins-Minerals (MULTIVITAMIN WITH MINERALS) tablet Take 1 tablet by mouth daily.   Yes Historical Provider, MD  oxyCODONE-acetaminophen (PERCOCET) 7.5-325 MG per tablet Take 1 tablet by mouth every 6 (six) hours as needed for pain. 11/18/14  Yes Adrena E Johnson, PA-C  potassium chloride SA (K-DUR,KLOR-CON) 20 MEQ tablet TAKE 1 TABLET BY MOUTH EVERY DAY 11/13/14  Yes Curt Bears, MD   Allergies  Allergen Reactions  . Tomato     FAMILY HISTORY:  indicated that her mother is deceased. She indicated that her father is deceased. She indicated that her sister is deceased. She indicated that her brother is alive. She indicated that her son is alive.  SOCIAL HISTORY:  reports that she quit smoking about 6 months ago. Her smoking use included Cigarettes. She smoked 0.50 packs per day. She does not have any smokeless tobacco history on file. She reports that she drinks alcohol. She reports that she uses illicit drugs (Marijuana) about 14 times per week.  REVIEW OF SYSTEMS:  Cannot obtain  SUBJECTIVE:   VITAL SIGNS: Temp:  [97.3 F (36.3 C)-98.9 F (37.2 C)] 97.3 F (36.3 C) (04/27 0800) Pulse Rate:  [80-126] 80 (04/27 0650) Resp:  [13-27] 20 (04/27 0650) BP: (92-128)/(57-112) 128/112 mmHg (  04/27 0625) SpO2:  [70 %-92 %] 85 % (04/27 0650) Weight:  [78.3 kg (172 lb 9.9 oz)-80.1 kg (176 lb 9.4 oz)] 78.3 kg (172 lb 9.9 oz) (04/27 0402) HEMODYNAMICS:   VENTILATOR SETTINGS:   INTAKE / OUTPUT:  Intake/Output Summary (Last 24 hours) at Dec 26, 2014 1102 Last data filed at Dec 26, 2014 0700  Gross per 24 hour  Intake 2152.5 ml  Output    595 ml  Net 1557.5 ml    PHYSICAL EXAMINATION: General:  Respiratory  distress Neuro:  Sonorous, Intermittently reaches to head, opens eyes intermittently, does not follow commands HEENT:  NCAT, OP clear Cardiovascular:  Tachy, regular, no mgr Lungs:  Few crackles bases, otherwise clear, increased respiratory effort Abdomen:  BS very infrequent, drains in place, soft Musculoskeletal:  Muscle atrophy noted Skin:  diaphoretic  LABS:  CBC  Recent Labs Lab 11/14/2014 1805 12/06/14 0120 12-26-14 0345  WBC 16.4* 11.5* 17.1*  HGB 9.6* 9.1* 10.0*  HCT 29.7* 28.5* 31.0*  PLT 282 299 167   Coag's  Recent Labs Lab 12/06/14 0120  APTT 40*  INR 1.25   BMET  Recent Labs Lab 12/06/14 0120 12/06/14 0424  0920 26-Dec-2014 0345  NA 136  --  138 140  K 2.4* 2.4* 2.7* 3.3*  CL 103  --  107 109  CO2 23  --  24 22  BUN 17  --  12 15  CREATININE 0.56  --  0.49* 0.52  GLUCOSE 120*  --  137* 140*   Electrolytes  Recent Labs Lab 12/06/14 0120  0920 Dec 26, 2014 0345  CALCIUM 7.7* 7.9* 8.5  MG 1.3*  --   --    Sepsis Markers  Recent Labs Lab 11/16/2014 2252 12/06/14 0120  LATICACIDVEN 1.49  --   PROCALCITON  --  1.92   ABG No results for input(s): PHART, PCO2ART, PO2ART in the last 168 hours. Liver Enzymes  Recent Labs Lab 11/20/2014 1805 12-26-14 0345  AST 19 34  ALT 22 36*  ALKPHOS 128* 218*  BILITOT 0.3 0.3  ALBUMIN 2.3* 1.9*   Cardiac Enzymes No results for input(s): TROPONINI, PROBNP in the last 168 hours. Glucose No results for input(s): GLUCAP in the last 168 hours.  Imaging No results found.   ASSESSMENT / PLAN:  PULMONARY A: Acute respiratory failure with hypoxemia> presumably inability to compensate in setting of severe sepsis, possibly pulmonary edema Extensive stage small cell lung cancer  P:   NRB mask Morphine as needed for respiratory distress Lasix now CXR port now No intubation, see below  CARDIOVASCULAR A: Sinus tachycardia in setting of severe sepsis, respiratory failure P:  Treat  respiratory failure with morphine Lasix Check CXR  GASTROINTESTINAL A:  Colonic perforation with abscess P:   Per surgery   INFECTIOUS A:  Severe sepsis due to peritonitis, abdominal abscesses P:   Continue current antibiotics  NEUROLOGIC A:  Acute encephalopathy> presumably hypercapnea and hypoxemia driven, worsened by sepsis; ddx includes stroke or epilepsy in setting of known brain mets which were never treated P:   Morphine for comfort   FAMILY  I spent an extensive amount of time at the bedside with her son and Dr. Hartford Poli and our NP Noe Gens reviewing her situation with her son.  Chart reviewed noting advanced stage small cell cancer.  Her overall prognosis is dismal given her incurable cancer and now worsenign sepsis picture.  She is currently actively dying.  We explained to her son that interventions such as mechanical ventilation would not  cure her cancer and she will die quickly whether or not she were to go on life support.  Based on this conversation he agreed to a DNR status, no CPR, no mechanical ventilation.  I discussed this with her sister by phone as well.  PCCM to sign off, call if questions  Roselie Awkward, MD [facility] Pager: [phone] Cell: [phone] If no response, call [phone]  04-Jan-2015, 11:02 AM

## 2014-12-12 NOTE — Progress Notes (Signed)
Subjective: Confused, won't let me examine her.  Pulling clothes and O2 off.  Much different from yesterday.  Breathing sounds congested.  Some wheezing.  No issues with PO's yesterday.  She still has foul smelling drainage from both drains.  Objective: Vital signs in last 24 hours: Temp:  [97.3 F (36.3 C)-98.9 F (37.2 C)] 97.3 F (36.3 C) (04/27 0800) Pulse Rate:  [80-126] 80 (04/27 0650) Resp:  [13-27] 20 (04/27 0650) BP: (89-128)/(53-112) 128/112 mmHg (04/27 0625) SpO2:  [70 %-97 %] 85 % (04/27 0650) Weight:  [78.3 kg (172 lb 9.9 oz)-80.1 kg (176 lb 9.4 oz)] 78.3 kg (172 lb 9.9 oz) (04/27 0402)   Regular diet PO not recorded Drains 75, Urine 595 recorded. Afebrile BP is variable, Sats down in to the 70's-80's early this AM K+ still low but better, WBC is up to 17.1 CT drains placed 12/06/14 Intake/Output from previous day: 04/26 0701 - 04/27 0700 In: 2337.5 [I.V.:1467.5; IV Piggyback:850] Out: 670 [Urine:595; Drains:75] Intake/Output this shift:    General appearance: confused and doesn't want me to examine her.  she won't speak, pulling off O2 and clothing. Resp: wheezing, some rales this Am, hard to examine, pushing me away. GI: More tender RLQ this AM, drainage still purulent with foul ordor.  Lab Results:   Recent Labs  12/06/14 0120 Jan 06, 2015 0345  WBC 11.5* 17.1*  HGB 9.1* 10.0*  HCT 28.5* 31.0*  PLT 299 167    BMET  Recent Labs   0920 2015-01-06 0345  NA 138 140  K 2.7* 3.3*  CL 107 109  CO2 24 22  GLUCOSE 137* 140*  BUN 12 15  CREATININE 0.49* 0.52  CALCIUM 7.9* 8.5   PT/INR  Recent Labs  12/06/14 0120  LABPROT 15.8*  INR 1.25     Recent Labs Lab 11/16/2014 1805 01-06-15 0345  AST 19 34  ALT 22 36*  ALKPHOS 128* 218*  BILITOT 0.3 0.3  PROT 6.2 6.0  ALBUMIN 2.3* 1.9*     Lipase     Component Value Date/Time   LIPASE 15 11/17/2014 1805     Studies/Results: Ct Image Guided Drainage By Percutaneous  Catheter  12/06/2014   CLINICAL DATA:  Abscess  EXAM: CT IMAGE GUIDED DRAINAGE BY PERCUTANEOUS CATHETER; CT CORE BIOPSY RENAL  FLUOROSCOPY TIME:  None  MEDICATIONS AND MEDICAL HISTORY: Versed Three mg, Fentanyl 50 mcg.  Additional Medications: None.  ANESTHESIA/SEDATION: Moderate sedation time: 26 minutes  CONTRAST:  None  PROCEDURE: The procedure, risks, benefits, and alternatives were explained to the patient. Questions regarding the procedure were encouraged and answered. The patient understands and consents to the procedure.  The abdomen was prepped with Betadine in a sterile fashion, and a sterile drape was applied covering the operative field. A sterile gown and sterile gloves were used for the procedure.  Under CT guidance, an 18 gauge needle was inserted into the left upper quadrant abscess and removed over an Amplatz. A 12 French dilator followed by 12 [place] drain were inserted. It was looped and string fixed then sewn to the skin. Frank pus was aspirated. It was attached to a JP bulb.  The identical procedure was performed for the midline upper abdominal abscess.  FINDINGS: Images demonstrate placement of 2 abdominal abscess drains.  COMPLICATIONS: None  IMPRESSION: Successful abdominal abscess drain x2.   Electronically Signed   By: Marybelle Killings M.D.   On: 12/06/2014 17:28   Ct Image Guided Drainage Percut Cath  Peritoneal Retroperit  12/06/2014  CLINICAL DATA:  Abscess  EXAM: CT IMAGE GUIDED DRAINAGE BY PERCUTANEOUS CATHETER; CT CORE BIOPSY RENAL  FLUOROSCOPY TIME:  None  MEDICATIONS AND MEDICAL HISTORY: Versed Three mg, Fentanyl 50 mcg.  Additional Medications: None.  ANESTHESIA/SEDATION: Moderate sedation time: 26 minutes  CONTRAST:  None  PROCEDURE: The procedure, risks, benefits, and alternatives were explained to the patient. Questions regarding the procedure were encouraged and answered. The patient understands and consents to the procedure.  The abdomen was prepped with Betadine in a sterile  fashion, and a sterile drape was applied covering the operative field. A sterile gown and sterile gloves were used for the procedure.  Under CT guidance, an 18 gauge needle was inserted into the left upper quadrant abscess and removed over an Amplatz. A 12 French dilator followed by 12 [place] drain were inserted. It was looped and string fixed then sewn to the skin. Frank pus was aspirated. It was attached to a JP bulb.  The identical procedure was performed for the midline upper abdominal abscess.  FINDINGS: Images demonstrate placement of 2 abdominal abscess drains.  COMPLICATIONS: None  IMPRESSION: Successful abdominal abscess drain x2.   Electronically Signed   By: Marybelle Killings M.D.   On: 12/06/2014 17:28    Medications: . sodium chloride   Intravenous Once  . antiseptic oral rinse  7 mL Mouth Rinse BID  . dexamethasone  4 mg Intravenous Q12H  . piperacillin-tazobactam (ZOSYN)  IV  3.375 g Intravenous Q8H  . vancomycin  750 mg Intravenous Q8H    Assessment/Plan 1. Diffuse colonic infectious process with multiple abscess the largest of which are at hepatic flexure and splenic flexure with evidence of perforation-WBC down; on broad spectrum abxs IR drain placement x 2 12/06/14, Dr. Barbie Banner, IR. 2. 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 Day 4 of Zosyn/Vancomycin 4. DVT: SCD's/ not on heparin or Lovenox 5. Hypokalemia 6.  Confusion 7.  Hypoxia  8.  Increasing WBC   Plan:  I would keep her antibiotics going, right now I don't think you can feed her.  Confusion is new, son says it started this AM, pulling off O2 and dropping Sats.  I have ask them to page Dr. Hartford Poli so he can see what is occuring.    LOS: 3 days    Cianna Kasparian 2014/12/28

## 2014-12-12 NOTE — Progress Notes (Signed)
   Dec 29, 2014 1200  Clinical Encounter Type  Visited With Patient;Family;Patient and family together  Visit Type Initial;Critical Care;Psychological support;Spiritual support;Social support;Patient actively dying  Referral From Nurse  Consult/Referral To Nurse;Chaplain  Recommendations anticipatory grief support  Spiritual Encounters  Spiritual Needs Emotional;Grief support  Stress Factors  Family Stress Factors Major life changes;Loss    Chaplain present with son and extended family for emotional and spiritual support around anticipatory grief.

## 2014-12-12 NOTE — Discharge Summary (Signed)
Death Summary  Mariah Park BWG:[id number] DOB: 1956-06-08 DOA: 12-15-14  PCP: No PCP Per Patient PCP/Office notified:   Admit date: 12/15/14 Date of Death: 12-20-14  Final Diagnoses:  Principal Problem:   Acute respiratory failure with hypoxia Active Problems:   Brain metastases   Sepsis   Bowel perforation   Hypokalemia   Hyponatremia   Anemia   Abdominal abscess    History of present illness:  Mariah Park is a 59 y.o. 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 Patient is sedated with pain meds and detailed history could not be obtained. She would briefly wake up to the verbal cues and go back to sleep. He reports vomiting, unclear if she had nausea. On arrival to ED, she was found to have borderline BP, hypoxic and her CT abdomen revealed multiple wall abscesses along the ascending, transverse and proximal descending and sigmoid colon , with bowel perforation and free air. Surgery was consulted by EDP. Her lab work revealed severe hypokalemia Of 2.2 and hyponatremia, leukocytosis and a hemoglobin of 9.6. Surprisingly her lactic acid level is 1.49. She was referred for admission to medical service.   Hospital Course:  After patient admission to the hospital she is placed in the ICU, started on broad-spectrum antibiotics, patient did not respond very well to the antibiotics, as mentioned above patient has colonic wall abscesses, bowel perforation, intra-abdominal abscess in the presence of metastatic cancer and acute on chronic respiratory failure. Patient was getting more short of breath so the family was approached about comfort measures, providing the options of intubation and mechanical ventilation as she was still full code, both me and the CCM  physician did not recommend intubation/mechanical ventilation. Eventually the family (her son Mr. Mariah Park) elected not to go for intubation, to respect the dignity of his mother. Patient placed on morphine for shortness of breath and comfort, patient expired in the same day surrounded by family at [place] on 2014/12/18.   Time: 1846 Date of Death: Dec 18, 2014  Signed:  Verlee Monte A  [facility] 12-20-2014, 1:47 PM

## 2014-12-12 NOTE — Progress Notes (Signed)
Jan 01, 2015  Patient expired at [place]. Two RN's listened for breath and heart sounds. Pt had no breath nor heart sounds.  Family at bedside. Chaplain at bedside.

## 2014-12-12 NOTE — Progress Notes (Signed)
  Subjective: Patient restless, still complaining of abdominal pain. Multiple family members in room; patient now comfort care  Objective: Vital signs in last 24 hours: Temp:  [97.2 F (36.2 C)-97.7 F (36.5 C)] 97.2 F (36.2 C) (04/27 1200) Pulse Rate:  [80-126] 109 (04/27 1452) Resp:  [13-27] 18 (04/27 1452) BP: (95-128)/(57-112) 108/63 mmHg (04/27 1452) SpO2:  [70 %-91 %] 88 % (04/27 1452) Weight:  [172 lb 9.9 oz (78.3 kg)-176 lb 9.4 oz (80.1 kg)] 172 lb 9.9 oz (78.3 kg) (04/27 0402)    Intake/Output from previous day: 04/26 0701 - 04/27 0700 In: 2337.5 [I.V.:1467.5; IV Piggyback:850] Out: 670 [Urine:595; Drains:75] Intake/Output this shift: Total I/O In: 350 [I.V.:300; IV Piggyback:50] Out: 455 [Urine:455]  Abdominal drains intact, insertion sites okay, output left lateral abdominal drain 25 mL, medial abdominal drain 50 mL; cx's pending  Lab Results:   Recent Labs  12/06/14 0120 12/10/14 0345  WBC 11.5* 17.1*  HGB 9.1* 10.0*  HCT 28.5* 31.0*  PLT 299 167   BMET  Recent Labs   0920 2014/12/10 0345  NA 138 140  K 2.7* 3.3*  CL 107 109  CO2 24 22  GLUCOSE 137* 140*  BUN 12 15  CREATININE 0.49* 0.52  CALCIUM 7.9* 8.5   PT/INR  Recent Labs  12/06/14 0120  LABPROT 15.8*  INR 1.25   ABG No results for input(s): PHART, HCO3 in the last 72 hours.  Invalid input(s): PCO2, PO2  Studies/Results: Dg Chest Port 1 View  12/10/2014   CLINICAL DATA:  Hypoxia.  Metastatic lung carcinoma  EXAM: PORTABLE CHEST - 1 VIEW  COMPARISON:  Chest radiograph September 06, 2014 and chest CT October 01, 2014  FINDINGS: There is ext extensive airspace consolidation throughout much of the left lung, a dramatic change compared to prior studies. On the right, there is volume loss with mass in the right medial upper lobe/perihilar regions. Heart size and pulmonary vascularity are normal. No pneumothorax. Pulmonary vascularity appears overall within normal limits.   IMPRESSION: Extensive infiltrate on the left. Suspect pneumonia, although lymphangitic spread of tumor could present in this manner. Both entities may exist concurrently. Right upper lobe/right paratracheal mass appears grossly stable allowing for differences in position and technique. There is volume loss on the right. No change in cardiac silhouette.   Electronically Signed   By: Lowella Grip III M.D.   On: December 10, 2014 11:44    Anti-infectives: Anti-infectives    Start     Dose/Rate Route Frequency Ordered Stop    1400  vancomycin (VANCOCIN) IVPB 750 mg/150 ml premix     750 mg 150 mL/hr over 60 Minutes Intravenous Every 8 hours  1253     12/06/14 0000  vancomycin (VANCOCIN) IVPB 1000 mg/200 mL premix  Status:  Discontinued     1,000 mg 200 mL/hr over 60 Minutes Intravenous Every 12 hours  2348  1253   12/06/14 0000  piperacillin-tazobactam (ZOSYN) IVPB 3.375 g     3.375 g 12.5 mL/hr over 240 Minutes Intravenous Every 8 hours 11/16/2014 2348        Assessment/Plan: S/p palliative drainage of abdominal abscesses x 2, 12/06/14; patient now on comfort care measures.Plans as per IM/CCS  LOS: 3 days   15 minutes were spent eval pt post abd abscess drainage  ALLRED,D [facility] 10-Dec-2014

## 2014-12-12 NOTE — Progress Notes (Signed)
While charting previous note, I heard the outburst of family members crying as I was outside of room. I returned to pt rm to offer spt. Some thought pt was gone but nurse ckd and family continued to remain w/pt. Ms. Gindlesperger son had lft the rm. I stayed w/family members who moved to consult rm area during which time pt's aunt asked for prayer w/her and family members who were present. (pt's son was not present).  Chaplain remained on the unit until pt passed. At that time I offered emotional spt as family appropriately grieved. Since it was a rather large family, I encouraged visitation and continued w/pastoral presence. Pt's son and sisters want [facility]) called for pt remains. Chaplain gave that information to nurse. Staff was very comforting to family during the patient's transition. Ernest Haber Chaplain   12-22-2014 1900  Clinical Encounter Type  Visited With Family

## 2014-12-12 NOTE — Progress Notes (Signed)
[facility] PROGRESS NOTE  Mariah Park UUV:[id number] DOB: 1955-09-09 DOA: 11/19/2014 PCP: No PCP Per Patient  Interim summary 59 yo F with stage IV lung cancer that was diagnosed in November 2015, MRI of the brain performed on 06/18/2014 showing for metastatic deposits in the brain, undergoing systemic chemotherapy with carboplatin and etoposide status post 6 cycles, admitted to the medicine service on 12/06/2014 when she presented with complaints of abdominal pain associate with a steep functional decline over the past 3-4 days. CT scan of abdomen and pelvis performed on admission showed multiple wall abscesses along the ascending, transverse, proximal descending and sigmoid colon. Radiology feeling that this could reflect underlying masses. There is also evidence of bowel perforation with diffuse free air tracking and medial aspect of the liver and superior to the right kidney. General surgery consulted.  Subjective: Earlier patient was seen with son at bedside asked me not to wake her up because she was not feeling well last night. Received a call from our and that she is hypoxic and agitated. When I came and to see the patient patient is on nonrebreather mask, oxygen saturation in the mid 80s. PCCM M.D. and PA at bedside discussing with the patient about CODE STATUS. Patient to be full comfort, no CODE BLUE, no intubation. Strongly agree with PCCM, I appreciate Dr. Anastasia Pall help.  Assessment/Plan:  Acute hypoxic respiratory failure Oxygen saturation was 72% on 6 L of oxygen through nasal cannula. Patient started on nonrebreather mask, sterile oxygen saturation in the mid 80s, good pulse waves. PCCM was on consult, discussed with the patient's son, recommended against intubation. Patient will be full comfort  Colonic wall abscesses/bowel perforation.  - Patient having a history of metastatic small cell lung cancer, presented with abdominal pain with CT imaging showing the  presence of multiple wall abscesses involving colon.  - There was a large abscess along the hepatic flexure of the colon containing both fluid and air, highly suspicious for bowel perforation. - general surgery consulted, following. Recommended IR drainage which was done 4/25, with pus coming out - microbiology from aspirate showing GNR and GPC. Patient is on Zosyn and vancomycin.   Metastatic lung cancer with brain metastases - Patient finished her chemotherapy, will be getting whole brain radiation, MRI of the brain reviewed from November 2015. - Dr. Julien Nordmann able to see pt yesterday, appreciate input  Sepsis - Present on admission, evidenced by heart rate of 106 with temperature 101.8, encephalopathy, secondary to acute intra-abdominal abscess - sepsis physiology has improved however still borderline hypotensive today, continue to monitor in SDU today   Hypokalemia. - persistent, will all K to IVF  Toxic encephalopathy. - Patient presenting with steep functional decline/failure to thrive, likely secondary to combination of advanced lung cancer along with intra-abdominal infectious processes/bowel perforation/peritonitis - Continue broad-spectrum IV antimicrobial therapy - mental status clearing today, however still sluggish  Goals of Care -Long discussion at bedside, patient was full code, switched to DNR/DNI, full comfort. -Patient likely to pass within hours.   Code Status: Full Code Family Communication: I spoke with her son "Demetrius Revel" who was present at bedside Disposition Plan: Full comfort Consultants:  General surgery  Oncology  IR   Antibiotics:  IV vancomycin  IV Zosyn   Objective: Filed Vitals:   01/03/2015 0800  BP:   Pulse:   Temp: 97.3 F (36.3 C)  Resp:     Intake/Output Summary (Last 24 hours) at 01-03-2015 1206 Last data filed at 2015/01/03 0700  Gross per 24 hour  Intake   1985 ml  Output    595 ml  Net   1390 ml   Filed Weights   11/29/2014  2346  2044 12/16/2014 0402  Weight: 74.3 kg (163 lb 12.8 oz) 80.1 kg (176 lb 9.4 oz) 78.3 kg (172 lb 9.9 oz)    Exam:  General:  Patient is Alert, slow to respond  HEENT; no scleral icterus   Cardiovascular: Tachycardic, Regular rate and rhythm normal S1-S2 no murmurs rubs or gallops  Respiratory: Coarse respiratory sounds, having a few by basilar crackles, normal respiratory effort  Abdomen: pain to palpation throughout, 2 drains in place  Skin: no rashes  Musculoskeletal: No joint swelling  Neuro: non focal   Data Reviewed: Basic Metabolic Panel:  Recent Labs Lab 12/04/2014 1805 12/06/14 0120 12/06/14 0424  0920 2014-12-16 0345  NA 133* 136  --  138 140  K 2.2* 2.4* 2.4* 2.7* 3.3*  CL 97 103  --  107 109  CO2 25 23  --  24 22  GLUCOSE 159* 120*  --  137* 140*  BUN 24* 17  --  12 15  CREATININE 0.67 0.56  --  0.49* 0.52  CALCIUM 8.2* 7.7*  --  7.9* 8.5  MG  --  1.3*  --   --   --    Liver Function Tests:  Recent Labs Lab 11/23/2014 1805 Dec 16, 2014 0345  AST 19 34  ALT 22 36*  ALKPHOS 128* 218*  BILITOT 0.3 0.3  PROT 6.2 6.0  ALBUMIN 2.3* 1.9*    Recent Labs Lab 11/22/2014 1805  LIPASE 15   CBC:  Recent Labs Lab 11/17/2014 1805 12/06/14 0120 12/16/14 0345  WBC 16.4* 11.5* 17.1*  NEUTROABS 13.5*  --   --   HGB 9.6* 9.1* 10.0*  HCT 29.7* 28.5* 31.0*  MCV 93.7 94.7 93.7  PLT 282 299 167   ProBNP (last 3 results)  Recent Labs  06/01/14 1614  PROBNP 68.9    CBG: No results for input(s): GLUCAP in the last 168 hours.  Recent Results (from the past 240 hour(s))  MRSA PCR Screening     Status: None   Collection Time:  11:52 PM  Result Value Ref Range Status   MRSA by PCR NEGATIVE NEGATIVE Final    Comment:        The GeneXpert MRSA Assay (FDA approved for NASAL specimens only), is one component of a comprehensive MRSA colonization surveillance program. It is not intended to diagnose MRSA infection nor to guide  or monitor treatment for MRSA infections.   Culture, blood (x 2)     Status: None (Preliminary result)   Collection Time: 12/06/14  1:20 AM  Result Value Ref Range Status   Specimen Description BLOOD LEFT ARM  Final   Special Requests BOTTLES DRAWN AEROBIC AND ANAEROBIC 6CC  Final   Culture   Final           BLOOD CULTURE RECEIVED NO GROWTH TO DATE CULTURE WILL BE HELD FOR 5 DAYS BEFORE ISSUING A FINAL NEGATIVE REPORT Performed at [facility]    Report Status PENDING  Incomplete  Culture, blood (x 2)     Status: None (Preliminary result)   Collection Time: 12/06/14  1:25 AM  Result Value Ref Range Status   Specimen Description BLOOD RIGHT HAND  Final   Special Requests BOTTLES DRAWN AEROBIC ONLY [facility]  Final   Culture   Final  BLOOD CULTURE RECEIVED NO GROWTH TO DATE CULTURE WILL BE HELD FOR 5 DAYS BEFORE ISSUING A FINAL NEGATIVE REPORT Performed at [facility]    Report Status PENDING  Incomplete  Culture, routine-abscess     Status: None (Preliminary result)   Collection Time: 12/06/14  4:36 PM  Result Value Ref Range Status   Specimen Description ABSCESS LEFT ABDOMEN  Final   Special Requests NONE  Final   Gram Stain   Final    FEW WBC PRESENT,BOTH PMN AND MONONUCLEAR NO SQUAMOUS EPITHELIAL CELLS SEEN ABUNDANT GRAM POSITIVE COCCI IN PAIRS IN CLUSTERS ABUNDANT GRAM NEGATIVE RODS MODERATE GRAM POSITIVE RODS    Culture   Final    Culture reincubated for better growth Performed at [facility]    Report Status PENDING  Incomplete  Culture, routine-abscess     Status: None (Preliminary result)   Collection Time: 12/06/14  4:36 PM  Result Value Ref Range Status   Specimen Description ABSCESS ABDOMEN MID  Final   Special Requests NONE  Final   Gram Stain   Final    NO WBC SEEN NO SQUAMOUS EPITHELIAL CELLS SEEN ABUNDANT GRAM POSITIVE RODS ABUNDANT GRAM NEGATIVE RODS FEW GRAM POSITIVE COCCI    Culture   Final    Culture reincubated for  better growth Performed at [facility]    Report Status PENDING  Incomplete     Studies: US Abdomen Complete  11/14/2014   CLINICAL DATA:  59 year old female with a history of abdominal pain.  Given history of lung cancer with current chemotherapy treatment.  EXAM: ULTRASOUND ABDOMEN COMPLETE  COMPARISON:  None.  FINDINGS: Gallbladder: Small amount of echogenic debris within the gallbladder lumen without reflections. Negative sonographic Murphy's sign. Gallbladder wall measures 4 mm.  Common bile duct: Diameter: 5.2 mm  Liver: Coarsened echotexture of the liver without nodularity. No focal lesion identified.  IVC: No abnormality visualized.  Pancreas: Visualized portion unremarkable.  Spleen: Size and appearance within normal limits.  Right Kidney: Length: 11.6 cm. Echogenicity similar to that of the adjacent liver. No hydronephrosis.  Left Kidney: Length: 12.0 cm. Echogenicity similar to that of the adjacent spleen. Anechoic lesion with posterior acoustic enhancement measures 2.1 cm x 2.5 cm x 2.0 cm compatible with Bosniak 1 cyst.  Abdominal aorta: No aneurysm visualized.  Other findings: None.  IMPRESSION: Sonographic survey is equivocal for acute calculus cholecystitis, as there is evidence of minimal gallbladder wall thickening and debris within the gallbladder, though the sonographic Murphy sign is negative. If there is concern for further evaluation, a nuclear medicine HIDA study may be considered.  Signed,  Dulcy Fanny. Earleen Newport, DO  [facility]  [facility]   Electronically Signed   By: Corrie Mckusick D.O.   On: 12/04/2014 19:11   Ct Abdomen Pelvis W Contrast  11/29/2014   CLINICAL DATA:  Acute onset of generalized abdominal pain for 3 days. Diarrhea. Initial encounter.  EXAM: CT ABDOMEN AND PELVIS WITH CONTRAST  TECHNIQUE: Multidetector CT imaging of the abdomen and pelvis was performed using the standard protocol following bolus administration of  intravenous contrast.  CONTRAST:  10m OMNIPAQUE IOHEXOL 300 MG/ML SOLN, 1070mOMNIPAQUE IOHEXOL 300 MG/ML SOLN  COMPARISON:  CT of the chest, abdomen and pelvis performed 10/01/2014, and abdominal ultrasound performed earlier today at 6:27 p.m.  FINDINGS: Mild bibasilar atelectasis is noted.  There is an unusual acute colonic process with multiple wall abscesses about the ascending, transverse, proximal descending and sigmoid  colon. There is only mild inflammation about the colon, and this is of uncertain significance. It could reflect an unusual acute necrotic colitis, or may reflect underlying masses, not well seen, though there may have been masses along the hepatic flexure of the colon on the prior CT.  The largest fluid and air containing abscess is noted along the hepatic flexure of the colon and proximal transverse colon, measuring approximately 6.6 x 4.3 cm, and appears contiguous with a collection of fluid and air adjacent to the gallbladder, less well defined in appearance. Additional diffuse free air is noted tracking about the medial aspect of the liver and superior to the right kidney, with air tracking about the diaphragm. The bubbly appearance of the air raises concern for an underlying necrotizing infection.  A 6.0 cm abscess is noted at the splenic flexure of the colon. There appear to be two small wall abscesses along the sigmoid colon, measuring 2.5 cm and 1.7 cm in size.  The liver and spleen are unremarkable in appearance. The gallbladder is not well assessed due to surrounding free fluid and air, but appears grossly normal in size. The pancreas and adrenal glands are unremarkable.  A 2.5 cm cyst is noted at the anterior aspect of the left kidney. A 1.6 cm mildly hypoattenuating focus is noted on delayed images within the posterior aspect of the right kidney. This is not definitely seen on the prior study, and may reflect mild focal pyelonephritis. The kidneys are otherwise unremarkable. There  is no evidence of hydronephrosis. No renal or ureteral stones are seen. No perinephric stranding is appreciated.  The small bowel is unremarkable in appearance. The stomach is within normal limits. No acute vascular abnormalities are seen. Scattered calcification is seen along the abdominal aorta and its branches.  The appendix is not definitely seen; there is no evidence of appendicitis. Scattered diverticulosis is noted along the entirety of the colon, without evidence of diverticulitis.  The bladder is mildly distended and grossly unremarkable. The uterus is unremarkable in appearance. A 3.4 cm right adnexal cyst is likely physiologic in nature. No inguinal lymphadenopathy is seen.  No acute osseous abnormalities are identified. Mild facet disease noted at the lower lumbar spine.  IMPRESSION: 1. Unusual acute colonic process with multiple wall abscesses along the ascending, transverse, proximal descending and sigmoid colon. There is only mild inflammation about the remainder of the colon, and is of uncertain significance. It could reflect an acute necrotic colitis, or possibly underlying masses, not well seen, though there may have been masses along the hepatic flexure of the colon on the prior CT. 2. Bowel perforation with diffuse free air tracking about the medial aspect of the liver and superior to the right kidney, extending under the diaphragm. The bubbly appearance of the air raises concern for underlying necrotizing infection. The location of bowel perforation is suspected to be the hepatic flexure of the colon, as fluid and air about the gallbladder appears to extend from an abscess at the hepatic flexure. 3. Largest abscess, at the hepatic flexure of the colon and proximal transverse colon, measures 6.6 x 4.3 cm. 6.0 cm abscess noted at the splenic flexure of colon. Apparent two small wall abscess noted along the sigmoid colon, measuring 2.5 cm and 1.7 cm in size. 4. 1.6 cm mildly hypoattenuating focus  on delayed images at the posterior aspect of the right kidney. This could reflect mild focal pyelonephritis, or possibly a poorly characterized isodense cyst. Follow-up renal ultrasound could be considered  on an elective nonemergent basis. 2.5 cm left renal cyst noted. 5. 3.4 cm right adnexal cyst is likely physiologic, though pelvic ultrasound could be considered for further evaluation on an elective nonemergent basis. 6. Scattered calcification along the abdominal aorta and its branches. 7. Scattered diverticulosis along the entirety of the colon, without evidence of diverticulitis.  Critical Value/emergent results were called by telephone at the time of interpretation on 12/09/2014 at 10:24 pm to Dr. Lacretia Leigh, who verbally acknowledged these results.   Electronically Signed   By: Garald Balding M.D.   On: 12/06/2014 22:31   Ct Image Guided Drainage By Percutaneous Catheter  12/06/2014   CLINICAL DATA:  Abscess  EXAM: CT IMAGE GUIDED DRAINAGE BY PERCUTANEOUS CATHETER; CT CORE BIOPSY RENAL  FLUOROSCOPY TIME:  None  MEDICATIONS AND MEDICAL HISTORY: Versed Three mg, Fentanyl 50 mcg.  Additional Medications: None.  ANESTHESIA/SEDATION: Moderate sedation time: 26 minutes  CONTRAST:  None  PROCEDURE: The procedure, risks, benefits, and alternatives were explained to the patient. Questions regarding the procedure were encouraged and answered. The patient understands and consents to the procedure.  The abdomen was prepped with Betadine in a sterile fashion, and a sterile drape was applied covering the operative field. A sterile gown and sterile gloves were used for the procedure.  Under CT guidance, an 18 gauge needle was inserted into the left upper quadrant abscess and removed over an Amplatz. A 12 French dilator followed by 12 [place] drain were inserted. It was looped and string fixed then sewn to the skin. Frank pus was aspirated. It was attached to a JP bulb.  The identical procedure was performed for the  midline upper abdominal abscess.  FINDINGS: Images demonstrate placement of 2 abdominal abscess drains.  COMPLICATIONS: None  IMPRESSION: Successful abdominal abscess drain x2.   Electronically Signed   By: Marybelle Killings M.D.   On: 12/06/2014 17:28   Ct Image Guided Drainage Percut Cath  Peritoneal Retroperit  12/06/2014   CLINICAL DATA:  Abscess  EXAM: CT IMAGE GUIDED DRAINAGE BY PERCUTANEOUS CATHETER; CT CORE BIOPSY RENAL  FLUOROSCOPY TIME:  None  MEDICATIONS AND MEDICAL HISTORY: Versed Three mg, Fentanyl 50 mcg.  Additional Medications: None.  ANESTHESIA/SEDATION: Moderate sedation time: 26 minutes  CONTRAST:  None  PROCEDURE: The procedure, risks, benefits, and alternatives were explained to the patient. Questions regarding the procedure were encouraged and answered. The patient understands and consents to the procedure.  The abdomen was prepped with Betadine in a sterile fashion, and a sterile drape was applied covering the operative field. A sterile gown and sterile gloves were used for the procedure.  Under CT guidance, an 18 gauge needle was inserted into the left upper quadrant abscess and removed over an Amplatz. A 12 French dilator followed by 12 [place] drain were inserted. It was looped and string fixed then sewn to the skin. Frank pus was aspirated. It was attached to a JP bulb.  The identical procedure was performed for the midline upper abdominal abscess.  FINDINGS: Images demonstrate placement of 2 abdominal abscess drains.  COMPLICATIONS: None  IMPRESSION: Successful abdominal abscess drain x2.   Electronically Signed   By: Marybelle Killings M.D.   On: 12/06/2014 17:28    Scheduled Meds: . sodium chloride   Intravenous Once  . antiseptic oral rinse  7 mL Mouth Rinse BID  . dexamethasone  4 mg Intravenous Q12H  . furosemide  40 mg Intravenous Q6H  . piperacillin-tazobactam (ZOSYN)  IV  3.375 g Intravenous  Q8H  . vancomycin  750 mg Intravenous Q8H   Continuous Infusions: . sodium chloride 0.9  % 1,000 mL with potassium chloride 20 mEq infusion 75 mL/hr at  1600    Active Problems:   Bowel perforation   Hypokalemia   Hyponatremia   Anemia   Abdominal abscess   [facility] A  [facility] Pager [phone]. If 7PM-7AM, please contact night-coverage at [email or web address], password [facility] December 10, 2014, 12:06 PM  LOS: 3 days

## 2014-12-12 NOTE — Progress Notes (Signed)
Pt's son was bedside and rubbing her head when I arrived. Pt's aunt, niece and one add't family relative was also present. Son seemed apprehensive about Chaplain visit. His aunt asked for prayer, son did not. (son said only Jesus could give her rights of passage and I told him I agreed and I offered prayer for him and his family for whenever he would like) Respecting son's wishes, I offered pastoral presence. Upon leaving I told them I would check back with them. Please page if anything changes. [place]   12/16/2014 1700  Clinical Encounter Type  Visited With Family;Patient and family together;Health care provider

## 2014-12-12 NOTE — Progress Notes (Signed)
PT very agitated at times. She wants to get out of bed and go around the hospital. Refusing to wear O2. Family assisted in trying to encourage pt to leave oxygen however she would become confused and frustrated. Able to move pt in bed enough to reposition for comfort for short periods of time.

## 2014-12-12 NOTE — Progress Notes (Signed)
[facility] Progress Note Patient Name: Mariah Park DOB: 1955/11/15 MRN: [id number]   Date of Service  12/15/2014  HPI/Events of Note  Pt agitated.  eICU Interventions  Will give 1 mg ativan IV.     Intervention Category Major Interventions: Other:  [name] Dec 15, 2014, 6:42 AM

## 2014-12-12 DEATH — deceased

## 2016-01-30 ENCOUNTER — Other Ambulatory Visit: Payer: Self-pay | Admitting: Nurse Practitioner

## 2016-05-09 IMAGING — CR DG CHEST 2V
2 series · 2 of 2 positions shown · non-contrast
Comparison: None.

CLINICAL DATA: Shortness of breath and chest pain

EXAM:
CHEST  2 VIEW

[w chest pa *]
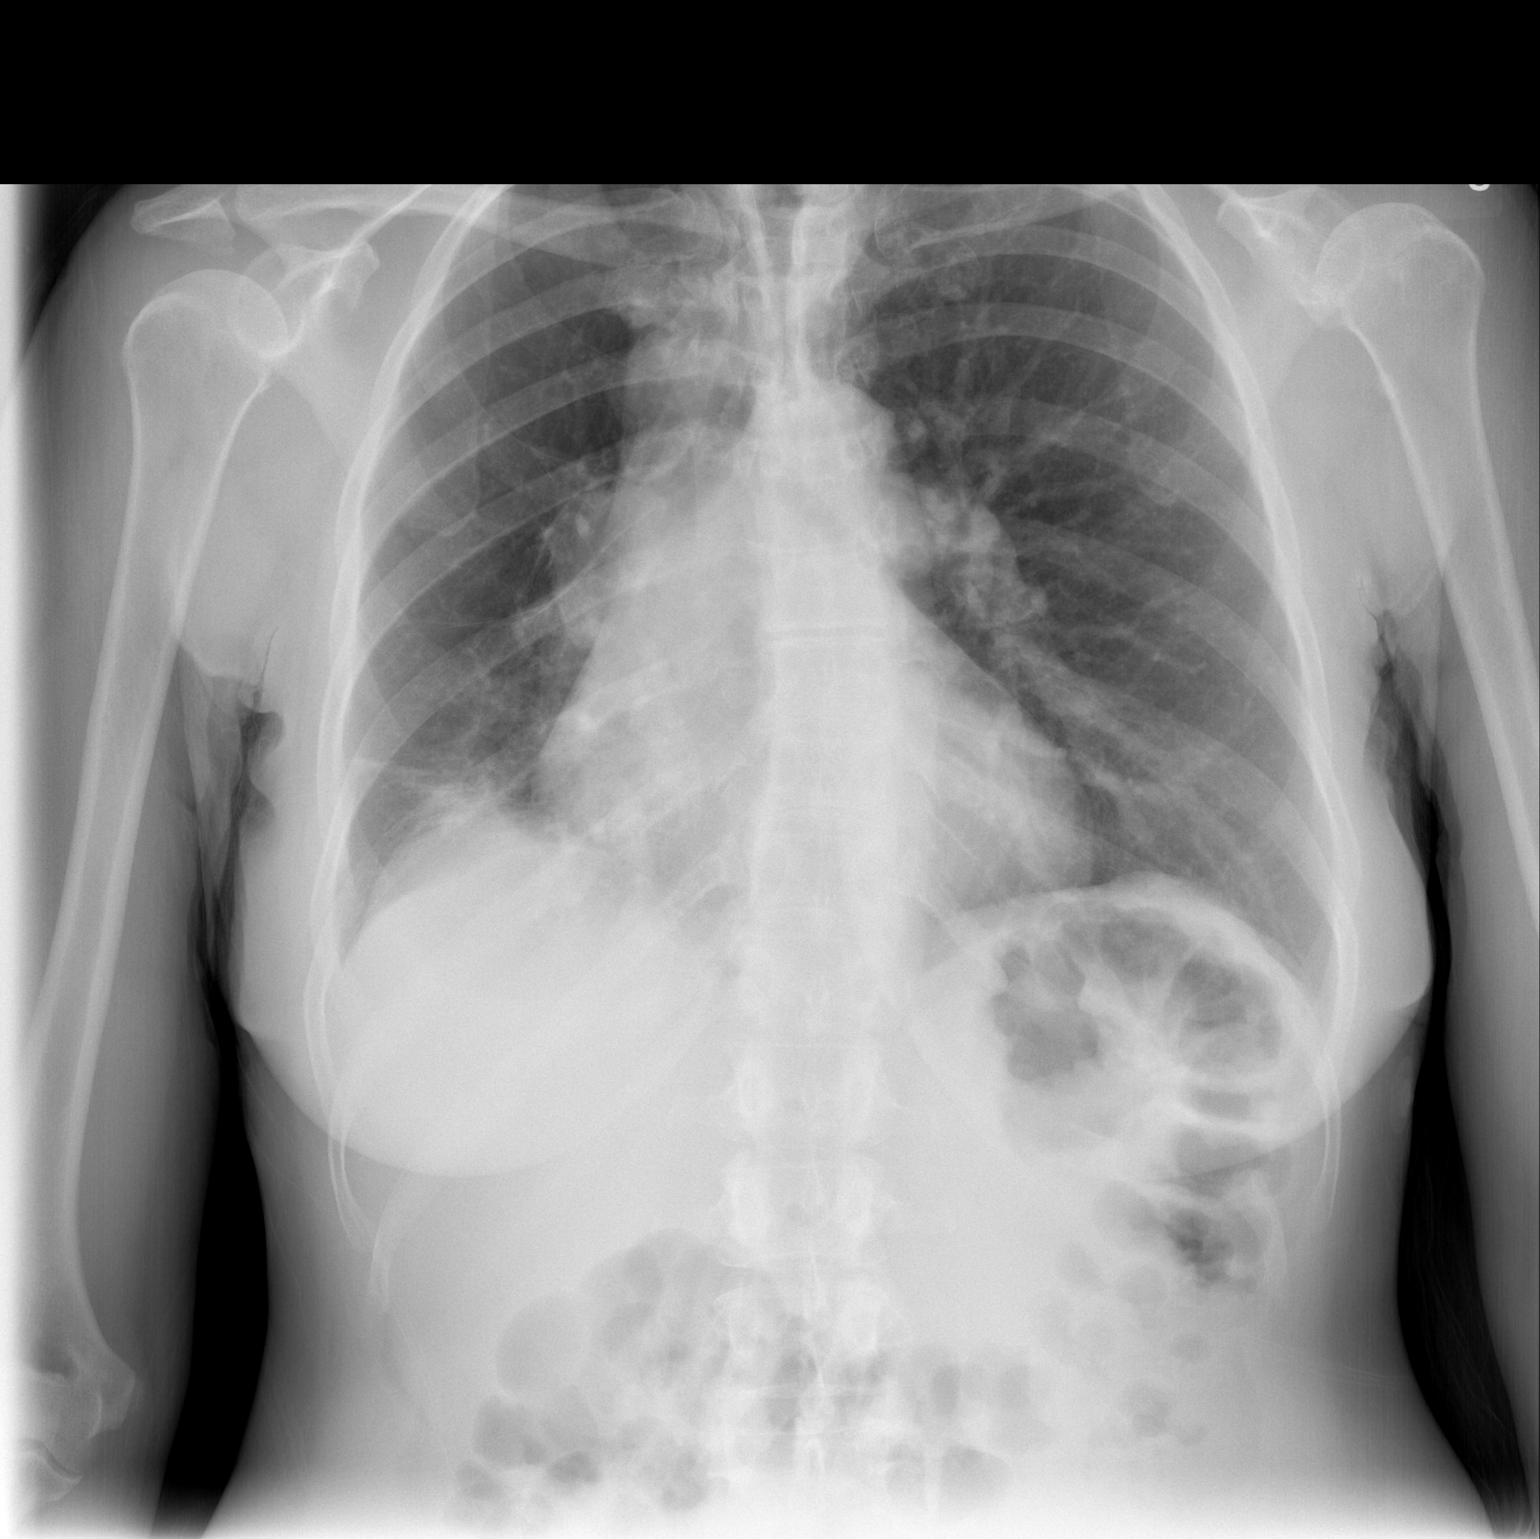

[w chest lat *]
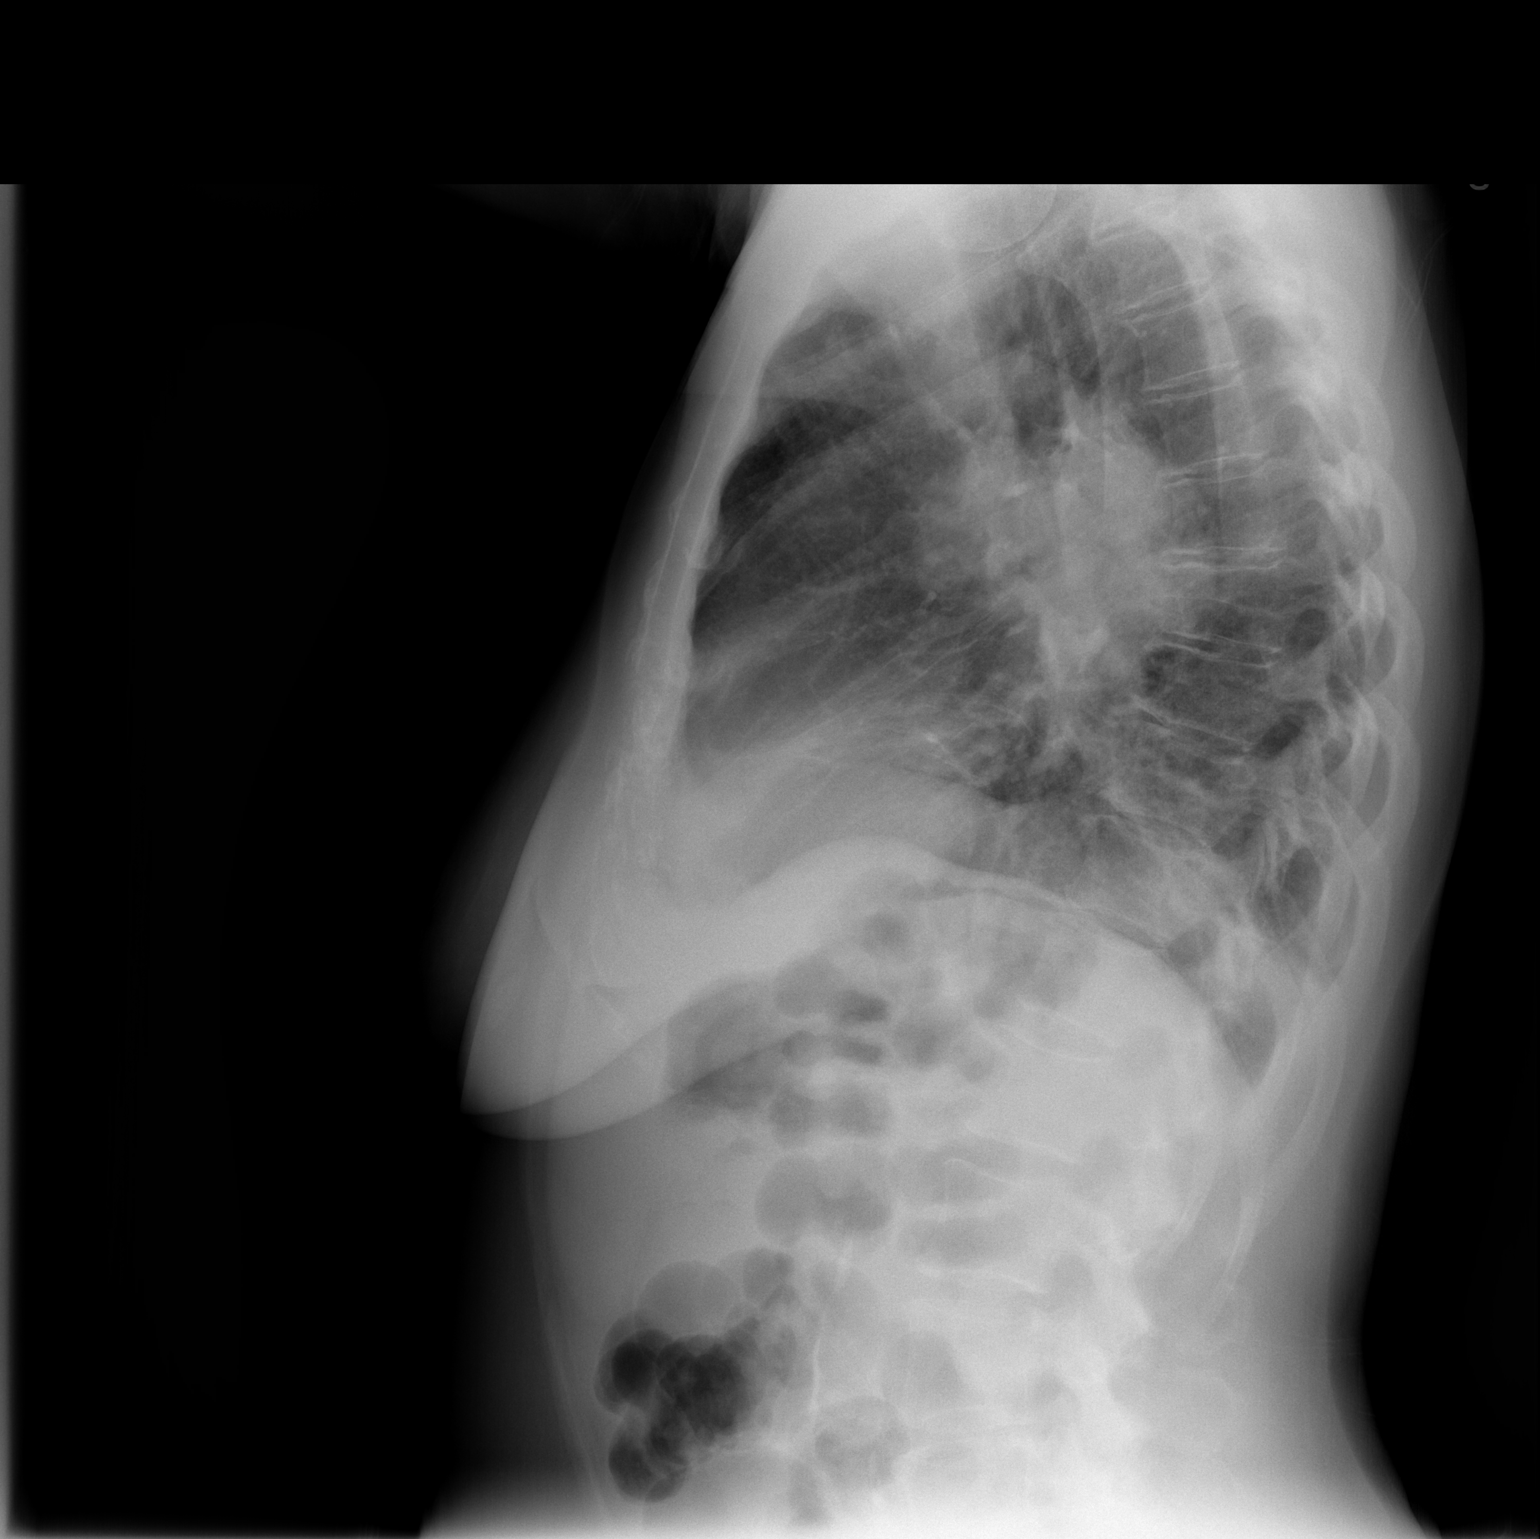

[2 of 2 positions shown; findings below may reference images not displayed]

FINDINGS: Cardiac shadow is within normal limits. There is fullness in the
right peritracheal region and right hilar region identified. Right
basilar atelectasis is noted. The left lung is well aerated. No
acute bony abnormality is seen.
IMPRESSION: Changes consistent with increased lymphadenopathy/possible mass in
the right peritracheal region and right hilar region. Further
evaluation by means of CT of the chest with contrast is recommended.

These results were called by telephone at the time of interpretation
on 06/01/2014 at [DATE] to Dr. Pedraza, who verbally acknowledged
these results.

## 2016-08-14 IMAGING — CR DG CHEST 2V
2 series · 2 of 2 positions shown · non-contrast
Comparison: 07/29/2014.  CT 08/11/2014

CLINICAL DATA: New cough of 3 days duration. Right upper chest
pain. Lung carcinoma, metastatic to brain.

EXAM:
CHEST  2 VIEW

[w chest pa]
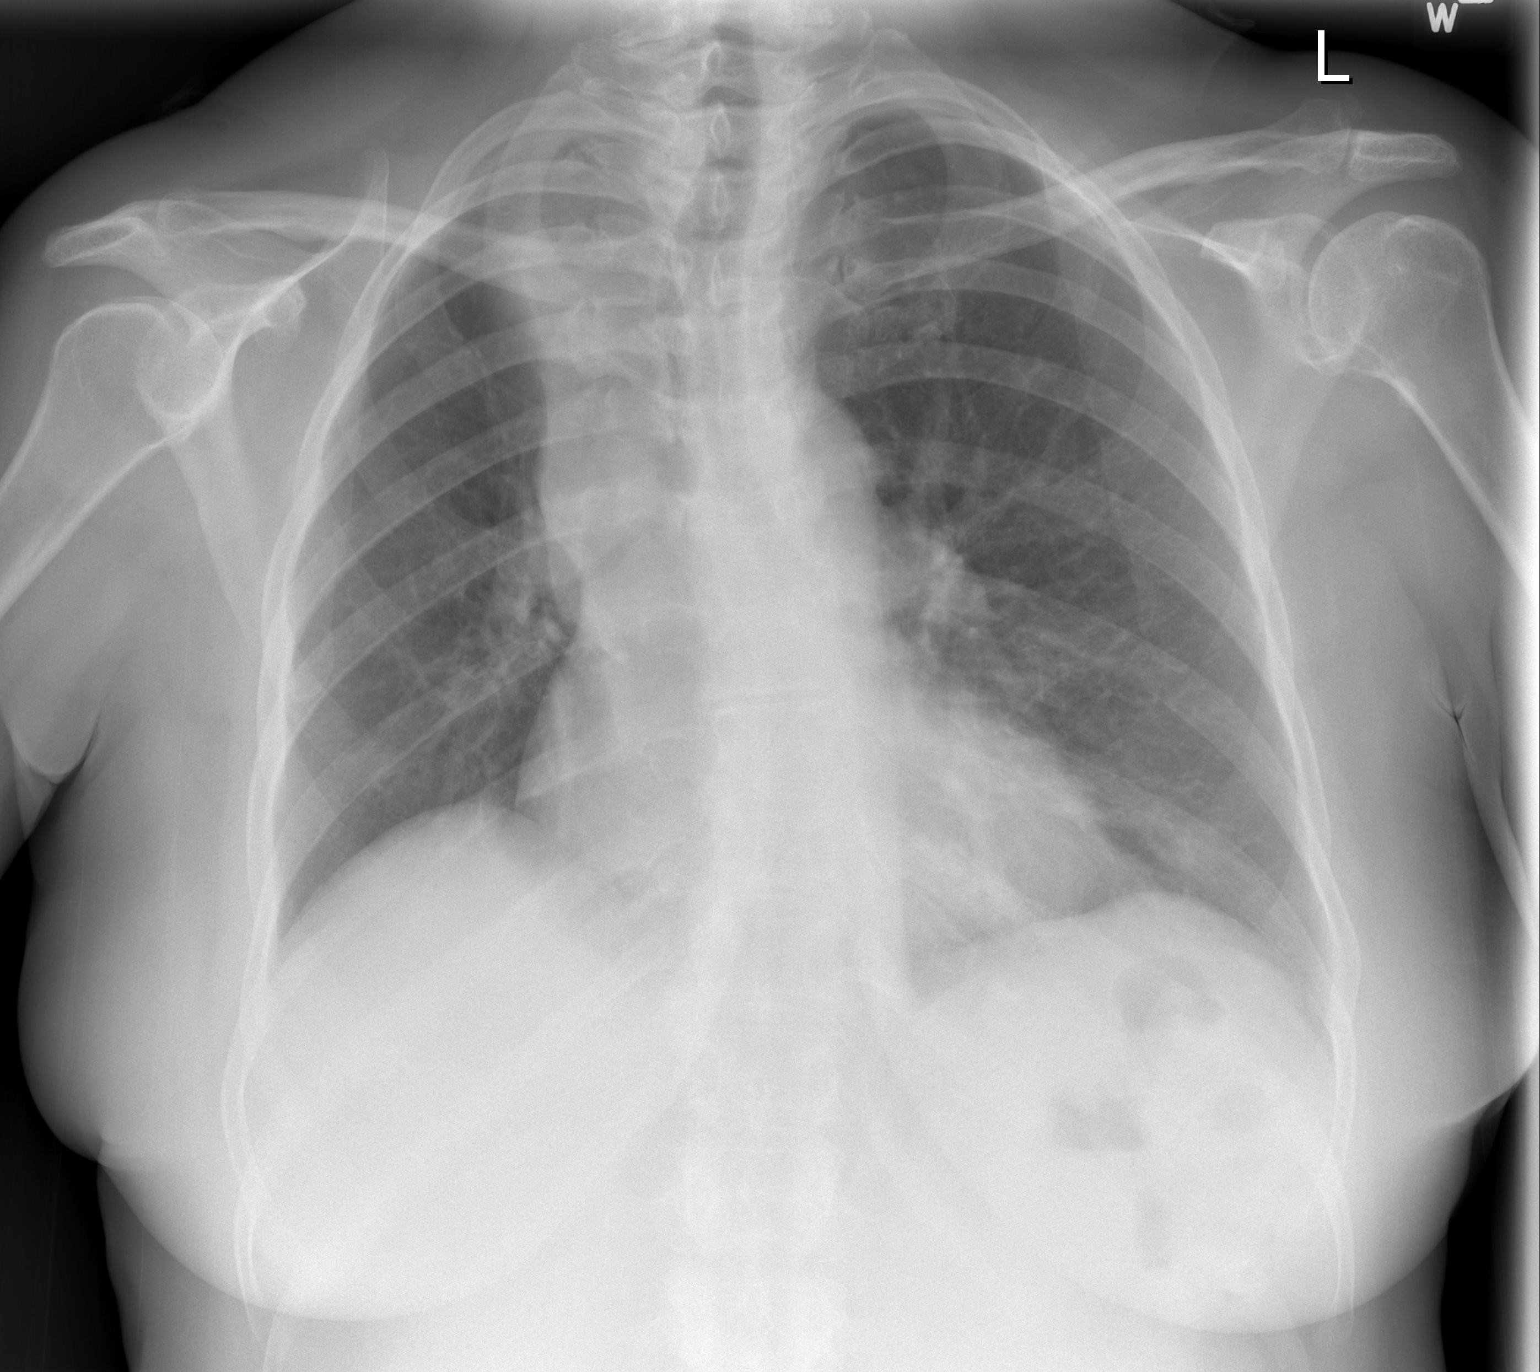

[w chest lat]
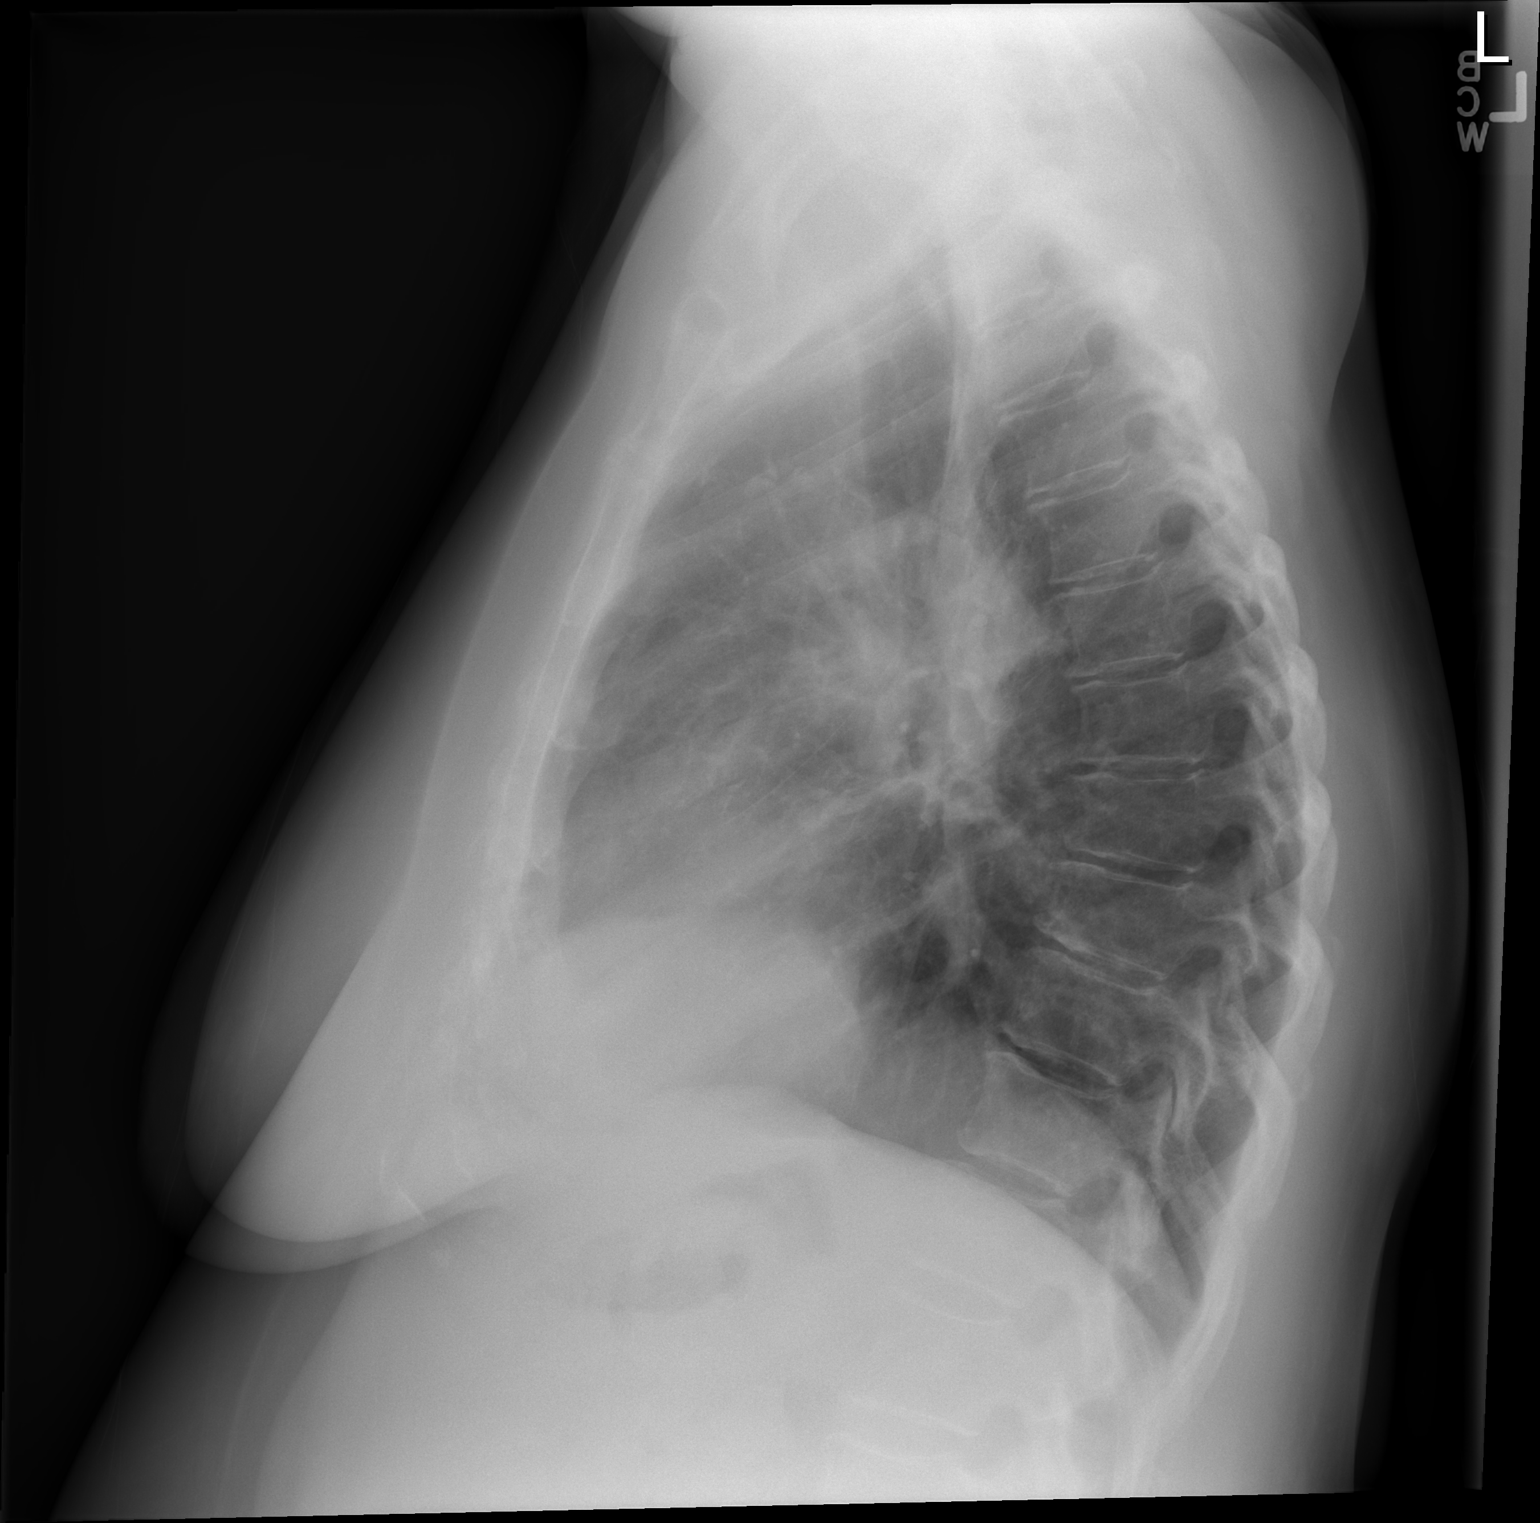

[2 of 2 positions shown; findings below may reference images not displayed]

FINDINGS: Central right lung mass with narrowed interlobar bronchus and with
ipsilateral mediastinal adenopathy, probably unchanged from the
08/11/2014 CT. The lungs are clear except for accentuated markings
in the bases which likely relate to the shallow degree of
inspiration. No effusions are evident.
IMPRESSION: Neoplastic changes in the thorax are probably not significantly
different from 08/11/2014. No superimposed acute finding is evident.

## 2016-09-08 IMAGING — CT CT ABD-PELV W/O CM
2 of 4 series · 15 of 46 positions shown, 17 images · non-contrast
Comparison: 08/11/2014

CLINICAL DATA: Restaging extensive stage small cell lung cancer.
Lung ca dx [DATE]-ongoing chemo, mild CP, small cell quit smoking
[DATE] after smoking 30 years. Per Dr. Antasa do scan without
contrast- no IV access

EXAM:
CT CHEST, ABDOMEN AND PELVIS WITHOUT CONTRAST
TECHNIQUE: Multidetector CT imaging of the chest, abdomen and pelvis was
performed following the standard protocol without IV contrast.

[Series 2: cap w/o w/o st · axial · non-contrast · 0.77mm/px · z∈[-578,-12]mm · 12 of 125 slices shown, 14 images]
[im 6/125  soft-tissue]
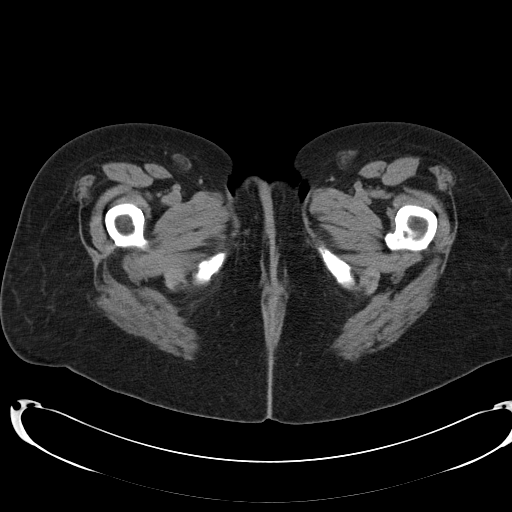
[im 6/125  bone]
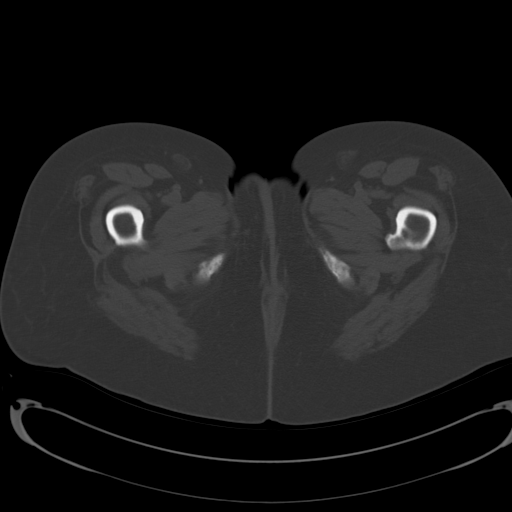
[im 18/125  soft-tissue]
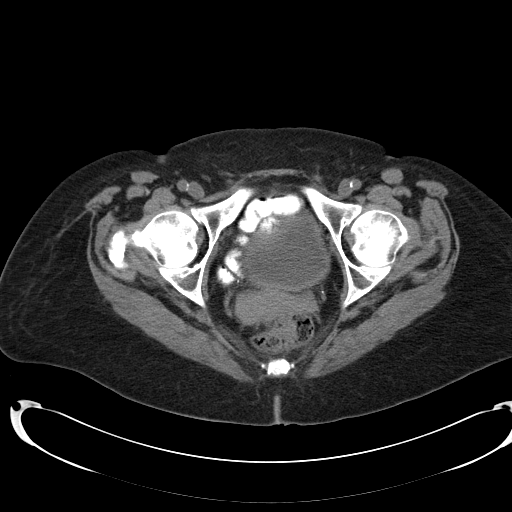
[im 30/125  soft-tissue]
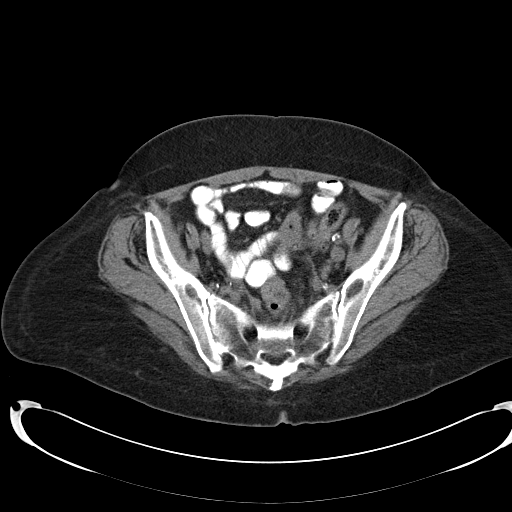
[im 36/125  soft-tissue]
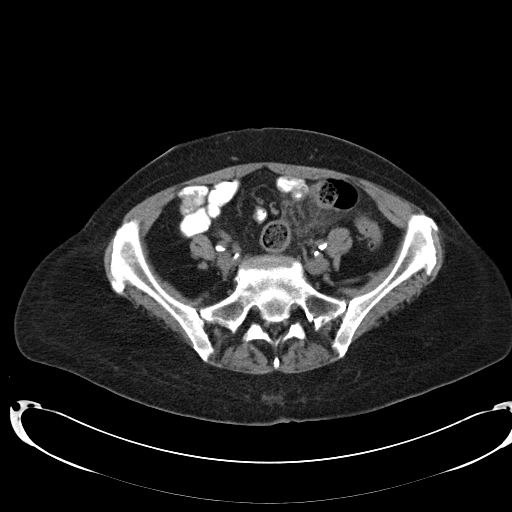
[im 48/125  soft-tissue]
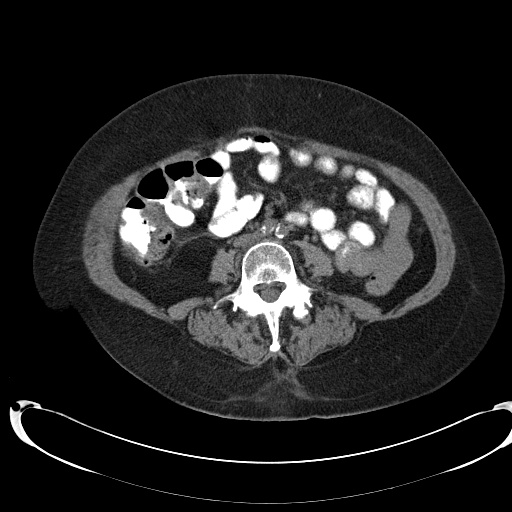
[im 60/125  soft-tissue]
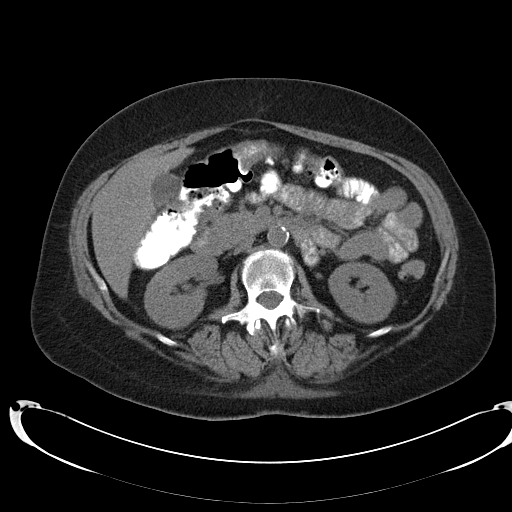
[im 65/125  soft-tissue]
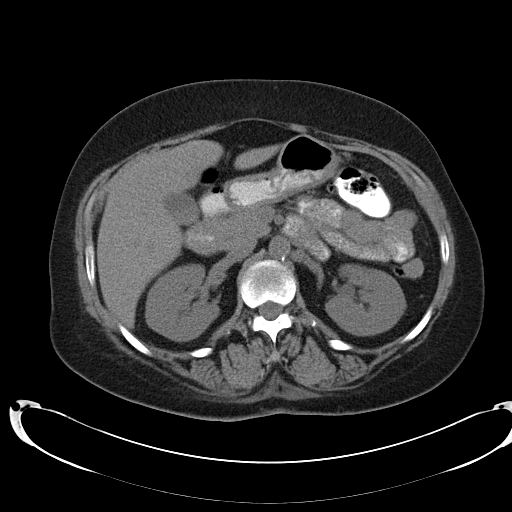
[im 77/125  soft-tissue]
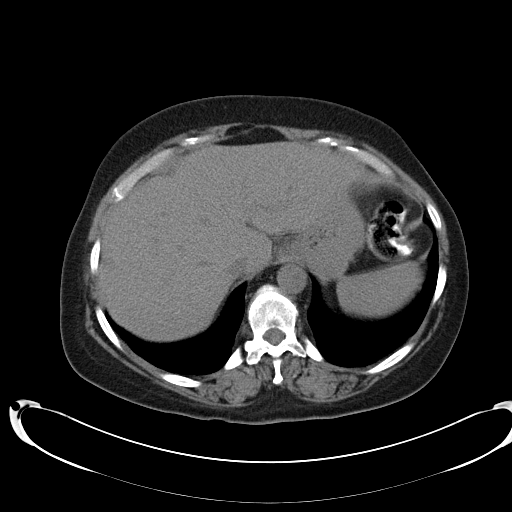
[im 89/125  soft-tissue]
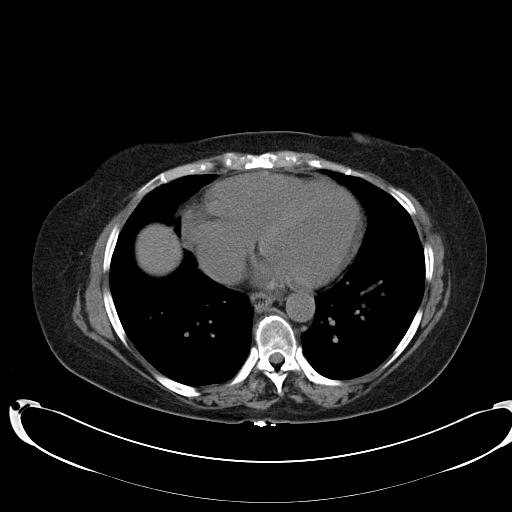
[im 89/125  bone]
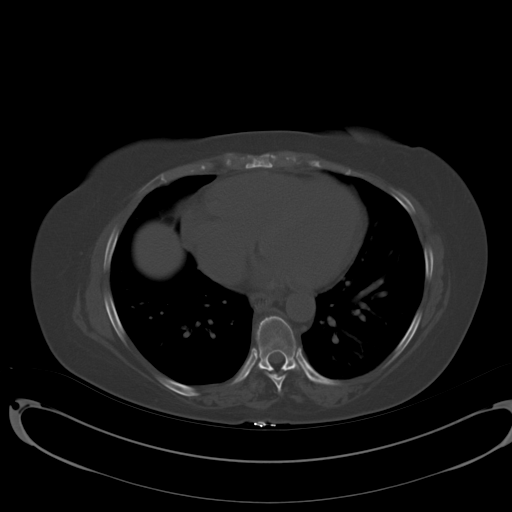
[im 95/125  soft-tissue]
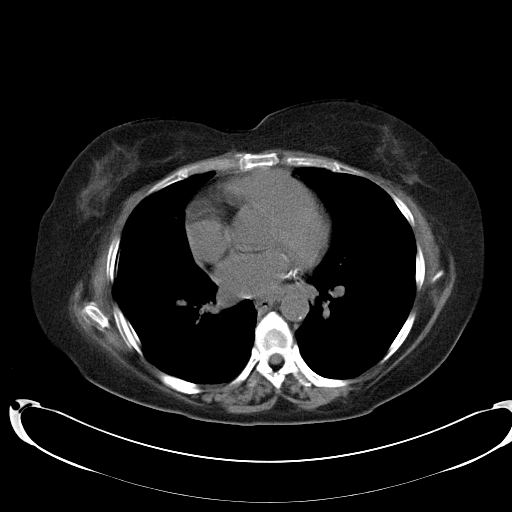
[im 107/125  soft-tissue]
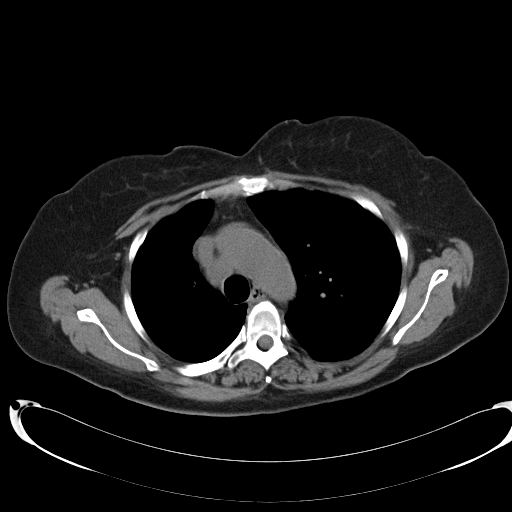
[im 119/125  soft-tissue]
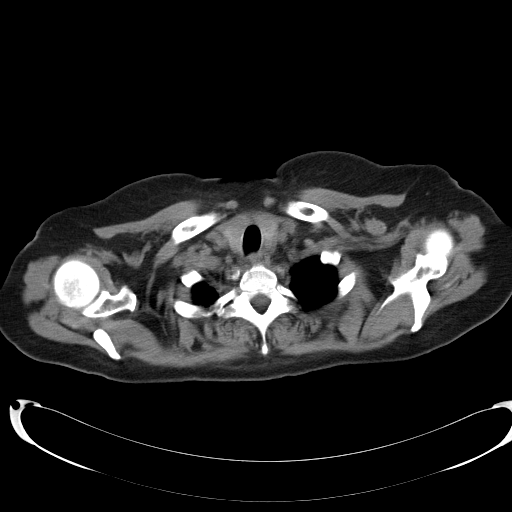

[Series 602: <mpr range> · coronal · 1.22mm/px · 3 of 50 slices shown]
[im 17/50  soft-tissue]
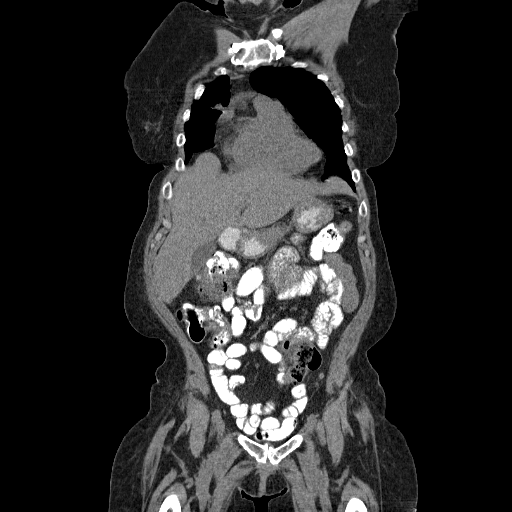
[im 22/50  soft-tissue]
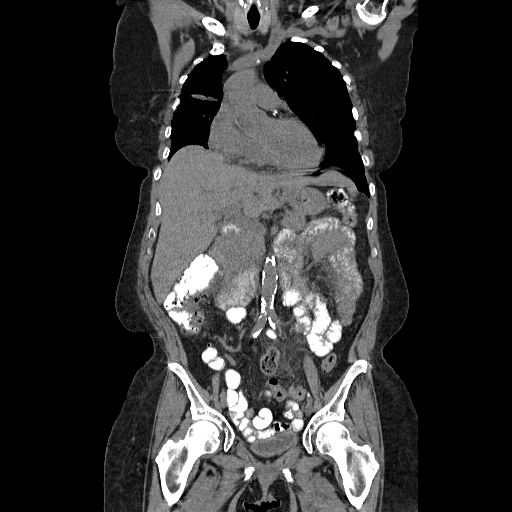
[im 28/50  soft-tissue]
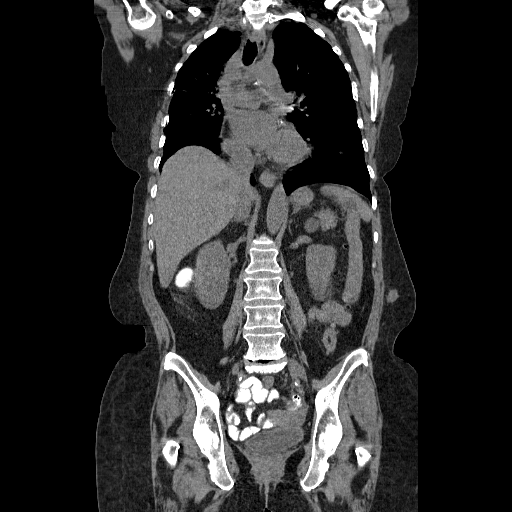

[15 of 46 positions shown; findings below may reference images not displayed]

FINDINGS: CT CHEST FINDINGS

Exam done without intravenous contrast due to no IV access.

Lungs are adequately inflated with resolution of the previous noted
patchy ground-glass attenuation over the left upper lower lobe.
There is mild centrilobular emphysema. Mild linear
atelectasis/scarring over the right midlung unchanged. No evidence
of pleural effusion. Stable 4 mm subpleural nodule over the
posterior left lower lobe. Stable subpleural 1 2 mm nodule over the
lateral right upper lobe. No new nodules.

Slight decrease in size of the previously noted necrotic upper right
peritracheal node measuring 1.7 x 1.9 cm (previously 2.2 x 2.5 cm).
Previously noted right hilar mass continuous with subcarinal
adenopathy measures approximately 4.8 x 5.4 cm in total (previously
6.1 x 5 cm). There is continued moderate mass effect on the adjacent
right bronchus. No definite left hilar adenopathy and no evidence of
axillary adenopathy. Heart remaining mediastinal structures are
unchanged.

CT ABDOMEN AND PELVIS FINDINGS

Abdominal images demonstrate the liver, spleen, pancreas,
gallbladder and adrenal glands to be within normal. Kidney is normal
in size without hydronephrosis or nephrolithiasis. Stable 2.6 cm
left renal cyst. Mild atherosclerotic plaque of the abdominal aorta
and iliac arteries. Appendix is normal. There is diverticulosis of
the colon. There is no free fluid or adenopathy.

There is inflammatory change within the pericolonic fat adjacent a
short segment of the sigmoid colon in the left lower quadrant with
prominent associated diverticula as findings suggest acute
diverticulitis. No evidence of perforation or abscess. Stable
subcutaneous nodule over the left flank measuring 1.4 x 1.8 cm.

Pelvic images demonstrate the uterus, ovaries, bladder and rectum to
be within normal. There are degenerative changes of the spine.
IMPRESSION: Continued positive response to ongoing chemotherapy with decrease in
size of mediastinal adenopathy as well as the right
hilar/mediastinal mass. Stable subcutaneous nodule over the left
flank. Two stable subcentimeter pulmonary nodules as described.

Diverticulosis of the colon with inflammatory change adjacent a
short segment of the sigmoid colon in the left lower quadrant
concerning for acute diverticulitis. No abscess or perforation.

Stable subcutaneous metastatic nodule over the left flank.

Mild centrilobular emphysema and stable scarring/ atelectasis over
the right midlung. Atherosclerotic coronary artery disease.

Stable 2.6 cm left renal cyst.

These results will be called to the ordering clinician or
representative by the Radiologist Assistant, and communication
documented in the PACS or zVision Dashboard.
